# Patient Record
Sex: Female | Born: 1950 | Race: Black or African American | Hispanic: No | Marital: Single | Smoking: Current some day smoker
Health system: Southern US, Community
[De-identification: ages and names within clinical notes are randomized; demographics above are authoritative.]

## PROBLEM LIST (undated history)

## (undated) ENCOUNTER — Emergency Department (HOSPITAL_COMMUNITY): Payer: Medicaid Other

## (undated) DIAGNOSIS — F99 Mental disorder, not otherwise specified: Secondary | ICD-10-CM

## (undated) DIAGNOSIS — F32A Depression, unspecified: Secondary | ICD-10-CM

## (undated) DIAGNOSIS — F319 Bipolar disorder, unspecified: Secondary | ICD-10-CM

## (undated) DIAGNOSIS — F1010 Alcohol abuse, uncomplicated: Secondary | ICD-10-CM

## (undated) DIAGNOSIS — R0602 Shortness of breath: Secondary | ICD-10-CM

## (undated) DIAGNOSIS — C801 Malignant (primary) neoplasm, unspecified: Secondary | ICD-10-CM

## (undated) DIAGNOSIS — F329 Major depressive disorder, single episode, unspecified: Secondary | ICD-10-CM

## (undated) DIAGNOSIS — Z923 Personal history of irradiation: Secondary | ICD-10-CM

## (undated) HISTORY — DX: Bipolar disorder, unspecified: F31.9

---

## 2002-01-02 ENCOUNTER — Inpatient Hospital Stay (HOSPITAL_COMMUNITY): Admission: EM | Admit: 2002-01-02 | Discharge: 2002-01-13 | Payer: Self-pay | Admitting: *Deleted

## 2002-01-11 ENCOUNTER — Encounter: Payer: Self-pay | Admitting: Psychiatry

## 2002-05-05 ENCOUNTER — Inpatient Hospital Stay (HOSPITAL_COMMUNITY): Admission: EM | Admit: 2002-05-05 | Discharge: 2002-05-11 | Payer: Self-pay | Admitting: Psychiatry

## 2002-11-04 ENCOUNTER — Emergency Department (HOSPITAL_COMMUNITY): Admission: EM | Admit: 2002-11-04 | Discharge: 2002-11-04 | Payer: Self-pay | Admitting: Emergency Medicine

## 2002-11-11 ENCOUNTER — Emergency Department (HOSPITAL_COMMUNITY): Admission: EM | Admit: 2002-11-11 | Discharge: 2002-11-11 | Payer: Self-pay | Admitting: *Deleted

## 2003-01-02 ENCOUNTER — Emergency Department (HOSPITAL_COMMUNITY): Admission: EM | Admit: 2003-01-02 | Discharge: 2003-01-02 | Payer: Self-pay | Admitting: Emergency Medicine

## 2003-02-18 ENCOUNTER — Emergency Department (HOSPITAL_COMMUNITY): Admission: EM | Admit: 2003-02-18 | Discharge: 2003-02-18 | Payer: Self-pay | Admitting: Emergency Medicine

## 2005-08-12 ENCOUNTER — Emergency Department (HOSPITAL_COMMUNITY): Admission: EM | Admit: 2005-08-12 | Discharge: 2005-08-12 | Payer: Self-pay | Admitting: Emergency Medicine

## 2005-10-25 ENCOUNTER — Emergency Department (HOSPITAL_COMMUNITY): Admission: EM | Admit: 2005-10-25 | Discharge: 2005-10-26 | Payer: Self-pay | Admitting: Emergency Medicine

## 2005-12-06 ENCOUNTER — Emergency Department (HOSPITAL_COMMUNITY): Admission: EM | Admit: 2005-12-06 | Discharge: 2005-12-06 | Payer: Self-pay | Admitting: Emergency Medicine

## 2006-05-26 ENCOUNTER — Emergency Department (HOSPITAL_COMMUNITY): Admission: EM | Admit: 2006-05-26 | Discharge: 2006-05-27 | Payer: Self-pay | Admitting: Emergency Medicine

## 2010-01-07 ENCOUNTER — Emergency Department (HOSPITAL_COMMUNITY): Admission: EM | Admit: 2010-01-07 | Discharge: 2010-01-08 | Payer: Self-pay | Admitting: Emergency Medicine

## 2011-02-20 LAB — CBC
HCT: 37.7 % (ref 36.0–46.0)
Hemoglobin: 13 g/dL (ref 12.0–15.0)
Platelets: 208 10*3/uL (ref 150–400)
WBC: 8.5 10*3/uL (ref 4.0–10.5)

## 2011-02-20 LAB — URINALYSIS, ROUTINE W REFLEX MICROSCOPIC
Bilirubin Urine: NEGATIVE
Nitrite: NEGATIVE
Specific Gravity, Urine: 1.002 — ABNORMAL LOW (ref 1.005–1.030)
Urobilinogen, UA: 0.2 mg/dL (ref 0.0–1.0)
pH: 5.5 (ref 5.0–8.0)

## 2011-02-20 LAB — RAPID URINE DRUG SCREEN, HOSP PERFORMED
Amphetamines: NOT DETECTED
Barbiturates: NOT DETECTED
Benzodiazepines: NOT DETECTED
Cocaine: NOT DETECTED
Opiates: NOT DETECTED
Tetrahydrocannabinol: NOT DETECTED

## 2011-02-20 LAB — BASIC METABOLIC PANEL
BUN: 10 mg/dL (ref 6–23)
CO2: 25 mEq/L (ref 19–32)
Calcium: 8.3 mg/dL — ABNORMAL LOW (ref 8.4–10.5)
Chloride: 102 mEq/L (ref 96–112)
Creatinine, Ser: 0.67 mg/dL (ref 0.4–1.2)
GFR calc Af Amer: >60 mL/min (ref >60)
GFR calc non Af Amer: >60 mL/min (ref >60)
Glucose, Bld: 120 mg/dL — ABNORMAL HIGH (ref 70–99)
Potassium: 3.4 mEq/L — ABNORMAL LOW (ref 3.5–5.1)
Sodium: 134 mEq/L — ABNORMAL LOW (ref 135–145)

## 2011-02-20 LAB — DIFFERENTIAL
Eosinophils Relative: 0 % (ref 0–5)
Lymphocytes Relative: 19 % (ref 12–46)
Lymphs Abs: 1.6 10*3/uL (ref 0.7–4.0)
Monocytes Relative: 6 % (ref 3–12)
Neutro Abs: 6.4 10*3/uL (ref 1.7–7.7)

## 2011-02-20 LAB — TRICYCLICS SCREEN, URINE: TCA Scrn: NOT DETECTED

## 2011-02-20 LAB — ETHANOL
Alcohol, Ethyl (B): 229 mg/dL — ABNORMAL HIGH (ref 0–10)
Alcohol, Ethyl (B): 352 mg/dL — ABNORMAL HIGH (ref 0–10)
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
## 2011-04-19 NOTE — Discharge Summary (Signed)
[facility]  Patient:    Lorraine Tapia, Lorraine Tapia Visit Number: [id number] MRN: [id number]          Service Type: PSY Location: [phone] Attending Physician:  Jeanice Lim Dictated by:   Jeanice Lim, M.D. Admit Date:  05/05/2002 Discharge Date: 05/11/2002                             Discharge Summary  IDENTIFYING DATA:  This is a 60 year old single African-American female involuntarily committed for psychosis.  The patient presented on commitment papers reporting the patient appeared confused, waiting for deceased family members to greet her; stated sister was an Hospital doctor.  ADMISSION MEDICATIONS:  Haldol injection every month.  ALLERGIES:  None.  PHYSICAL EXAMINATION:  GENERAL:  Essentially within normal limits except for obesity.  NEUROLOGIC:  Nonfocal.  ROUTINE ADMISSION LABORATORY DATA:  Within normal limits.  Albumin slightly low at 3.4.  MCV 106.3, elevated.  MENTAL STATUS EXAMINATION:  The patient was an alert, middle-aged, overweight African-American female, disheveled, casually dressed with good eye contact. Speech: Clear.  Mood: Euthymic.  Affect: Appropriate.  Thought process: Somewhat suspicious and paranoid.  Cognitive: Intact.  Judgment and insight: Limited.  ADMITTING DIAGNOSES: Axis I:    1. Paranoid schizophrenia.            2. Possible adjustment disorder. Axis II:   None. Axis III:  None. Axis IV:   Moderate problems with primary support group. Axis V:    30/60  HOSPITAL COURSE:  The patient was admitted and ordered routine p.r.n. medications and given Haldol Decanoate 100 mg IM now which was also ordered every four weeks.  The patient was set for followup.  She tolerated the injection well, had no overt psychotic symptoms nor dangerous ideation or inappropriate behavior during hospitalization.  CONDITION AT DISCHARGE:  Improved.  She was more calm.  Mood: Euthymic. Affect: Brighter.  Thought process: Goal  directed.  Thought content: Negative for dangerous ideation or psychotic symptoms.  DISCHARGE MEDICATIONS: 1. Zyprexa 10 mg q.h.s. 2. Haldol Decanoate 100 mg IM every four weeks; next due on July 3.  FOLLOWUPArn Medal on June 12 at 9:30 a.m.  DISCHARGE DIAGNOSES: Axis I:    1. Paranoid schizophrenia.            2. Possible adjustment disorder. Axis II:   None. Axis III:  None. Axis IV:   Moderate problems with primary support group. Axis V:    Global assessment of functioning on discharge was 50. Dictated by:   Jeanice Lim, M.D. Attending Physician:  Jeanice Lim DD:  06/09/02 TD:  06/11/02 Job: [id number]

## 2011-10-07 ENCOUNTER — Emergency Department (HOSPITAL_COMMUNITY)
Admission: EM | Admit: 2011-10-07 | Discharge: 2011-10-08 | Disposition: A | Discharge status: Home / Self Care | Payer: Medicaid Other | Attending: Emergency Medicine | Admitting: Emergency Medicine

## 2011-10-07 ENCOUNTER — Encounter (HOSPITAL_COMMUNITY): Payer: Self-pay | Admitting: Emergency Medicine

## 2011-10-07 DIAGNOSIS — Z139 Encounter for screening, unspecified: Secondary | ICD-10-CM | POA: Insufficient documentation

## 2011-10-07 DIAGNOSIS — F489 Nonpsychotic mental disorder, unspecified: Secondary | ICD-10-CM | POA: Insufficient documentation

## 2011-10-07 DIAGNOSIS — F1010 Alcohol abuse, uncomplicated: Secondary | ICD-10-CM | POA: Insufficient documentation

## 2011-10-07 HISTORY — DX: Mental disorder, not otherwise specified: F99

## 2011-10-07 LAB — URINALYSIS, ROUTINE W REFLEX MICROSCOPIC
Bilirubin Urine: NEGATIVE
Glucose, UA: NEGATIVE mg/dL
Protein, ur: NEGATIVE mg/dL
Urobilinogen, UA: 0.2 mg/dL (ref 0.0–1.0)

## 2011-10-07 LAB — COMPREHENSIVE METABOLIC PANEL
ALT: 12 U/L (ref 0–35)
AST: 18 U/L (ref 0–37)
Albumin: 3.7 g/dL (ref 3.5–5.2)
Alkaline Phosphatase: 79 U/L (ref 39–117)
CO2: 24 mEq/L (ref 19–32)
Chloride: 101 mEq/L (ref 96–112)
GFR calc non Af Amer: >90 mL/min (ref >90)
Potassium: 3.6 mEq/L (ref 3.5–5.1)
Sodium: 137 mEq/L (ref 135–145)
Total Bilirubin: 0.2 mg/dL — ABNORMAL LOW (ref 0.3–1.2)

## 2011-10-07 LAB — URINE MICROSCOPIC-ADD ON

## 2011-10-07 LAB — CBC
MCV: 99.7 fL (ref 78.0–100.0)
Platelets: 218 10*3/uL (ref 150–400)
RBC: 3.75 MIL/uL — ABNORMAL LOW (ref 3.87–5.11)
RDW: 13.7 % (ref 11.5–15.5)
WBC: 9.1 10*3/uL (ref 4.0–10.5)

## 2011-10-07 LAB — RAPID URINE DRUG SCREEN, HOSP PERFORMED
Amphetamines: NOT DETECTED
Barbiturates: NOT DETECTED
Opiates: NOT DETECTED
Tetrahydrocannabinol: NOT DETECTED

## 2011-10-07 NOTE — ED Notes (Signed)
Pt has a swollen jaw she stated that it has been like that for a year

## 2011-10-07 NOTE — ED Provider Notes (Signed)
History     CSN: [id number] Arrival date & time: 10/07/2011  6:15 PM   First MD Initiated Contact with Patient 10/07/11 1921      Chief Complaint  Patient presents with  . Fall    (Consider location/radiation/quality/duration/timing/severity/associated sxs/prior treatment) HPI Comments: Patient presents this afternoon to the emergency department and does not know why she is here.  She does admit to drinking alcohol today but denies that she drinks daily.  She denies any pain at this point in time.  I did review the nursing notes which report that she had a fall down 3 steps but patient denies any discomfort currently.  She denies any chest pain, shortness of breath, abdominal pain, nausea, diarrhea.  She does not claim that she is suicidal or homicidal at this point in time.  Onset of her symptoms appears to be today.  There is no specific inciting or relieving factors that I can determine at this time.  Patient is a 60 y.o. female presenting with fall. The history is provided by the patient. No language interpreter was used.  Fall Pertinent negatives include no fever, no abdominal pain, no nausea, no vomiting and no headaches.    Past Medical History  Diagnosis Date  . Psychiatric disorder     History reviewed. No pertinent past surgical history.  No family history on file.  History  Substance Use Topics  . Smoking status: Not on file  . Smokeless tobacco: Not on file  . Alcohol Use: Yes    OB History    Grav Para Term Preterm Abortions TAB SAB Ect Mult Living                  Review of Systems  Constitutional: Negative.  Negative for fever and chills.  HENT: Negative.   Eyes: Negative.  Negative for discharge and redness.  Respiratory: Negative.  Negative for cough and shortness of breath.   Cardiovascular: Negative.  Negative for chest pain.  Gastrointestinal: Negative.  Negative for nausea, vomiting, abdominal pain and diarrhea.  Genitourinary: Negative.   Negative for dysuria and vaginal discharge.  Musculoskeletal: Negative.  Negative for back pain.  Skin: Negative.  Negative for color change and rash.  Neurological: Negative.  Negative for syncope and headaches.  Hematological: Negative.  Negative for adenopathy.  Psychiatric/Behavioral: Negative for confusion.  All other systems reviewed and are negative.    Allergies  Review of patient's allergies indicates no known allergies.  Home Medications   Current Outpatient Rx  Name Route Sig Dispense Refill  . HALOPERIDOL LACTATE 5 MG/ML IJ SOLN  every 6 (six) hours as needed.        BP 181/92  Pulse 86  Temp(Src) 98 F (36.7 C) (Oral)  Resp 20  SpO2 98%  Physical Exam  Constitutional: She is oriented to person, place, and time. She appears well-developed and well-nourished.  Non-toxic appearance. She does not have a sickly appearance.       Words are slightly slurred on evaluation but she is otherwise appropriate.  HENT:  Head: Normocephalic and atraumatic.  Eyes: Conjunctivae, EOM and lids are normal. Pupils are equal, round, and reactive to light. No scleral icterus.  Neck: Trachea normal and normal range of motion. Neck supple.  Cardiovascular: Normal rate, regular rhythm and normal heart sounds.   Pulmonary/Chest: Effort normal and breath sounds normal.  Abdominal: Soft. Normal appearance. There is no tenderness. There is no rebound, no guarding and no CVA tenderness.  Musculoskeletal: Normal range  of motion.  Neurological: She is alert and oriented to person, place, and time. She has normal strength.  Skin: Skin is warm, dry and intact. No rash noted.  Psychiatric: She has a normal mood and affect. Her behavior is normal. Judgment and thought content normal.    ED Course  Procedures (including critical care time)  Results for orders placed during the hospital encounter of 10/07/11  CBC      Component Value Range   WBC 9.1  4.0 - 10.5 (K/uL)   RBC 3.75 (*) 3.87 -  5.11 (MIL/uL)   Hemoglobin 13.1  12.0 - 15.0 (g/dL)   HCT 16.1  09.6 - 04.5 (%)   MCV 99.7  78.0 - 100.0 (fL)   MCH 34.9 (*) 26.0 - 34.0 (pg)   MCHC 35.0  30.0 - 36.0 (g/dL)   RDW 40.9  81.1 - 91.4 (%)   Platelets 218  150 - 400 (K/uL)  COMPREHENSIVE METABOLIC PANEL      Component Value Range   Sodium 137  135 - 145 (mEq/L)   Potassium 3.6  3.5 - 5.1 (mEq/L)   Chloride 101  96 - 112 (mEq/L)   CO2 24  19 - 32 (mEq/L)   Glucose, Bld 109 (*) 70 - 99 (mg/dL)   BUN 10  6 - 23 (mg/dL)   Creatinine, Ser 7.82  0.50 - 1.10 (mg/dL)   Calcium 9.0  8.4 - 95.6 (mg/dL)   Total Protein 7.6  6.0 - 8.3 (g/dL)   Albumin 3.7  3.5 - 5.2 (g/dL)   AST 18  0 - 37 (U/L)   ALT 12  0 - 35 (U/L)   Alkaline Phosphatase 79  39 - 117 (U/L)   Total Bilirubin 0.2 (*) 0.3 - 1.2 (mg/dL)   GFR calc non Af Amer >90  >90 (mL/min)   GFR calc Af Amer >90  >90 (mL/min)  URINALYSIS, ROUTINE W REFLEX MICROSCOPIC      Component Value Range   Color, Urine YELLOW  YELLOW    Appearance CLEAR  CLEAR    Specific Gravity, Urine 1.008  1.005 - 1.030    pH 5.5  5.0 - 8.0    Glucose, UA NEGATIVE  NEGATIVE (mg/dL)   Hgb urine dipstick SMALL (*) NEGATIVE    Bilirubin Urine NEGATIVE  NEGATIVE    Ketones, ur NEGATIVE  NEGATIVE (mg/dL)   Protein, ur NEGATIVE  NEGATIVE (mg/dL)   Urobilinogen, UA 0.2  0.0 - 1.0 (mg/dL)   Nitrite NEGATIVE  NEGATIVE    Leukocytes, UA NEGATIVE  NEGATIVE   URINE RAPID DRUG SCREEN (HOSP PERFORMED)      Component Value Range   Opiates NONE DETECTED  NONE DETECTED    Cocaine NONE DETECTED  NONE DETECTED    Benzodiazepines NONE DETECTED  NONE DETECTED    Amphetamines NONE DETECTED  NONE DETECTED    Tetrahydrocannabinol NONE DETECTED  NONE DETECTED    Barbiturates NONE DETECTED  NONE DETECTED   ETHANOL      Component Value Range   Alcohol, Ethyl (B) 241 (*) 0 - 11 (mg/dL)  URINE MICROSCOPIC-ADD ON      Component Value Range   Squamous Epithelial / LPF RARE  RARE    RBC / HPF 0-2  <3 (RBC/hpf)      MDM  Patient is here with no acute complaints.  She appears in clinically intoxicated and in alcohol supports this.  She has no suicidal or homicidal thoughts and has no other complaints no  further evaluation has been completed.  As she has no family with her at this time she would need to stay here for a number of hours to sober up and be reevaluated at that time for likely discharge.  Patient will be turned over to Dr. Freida Busman.       Nat Christen, MD 10/08/11 [phone]

## 2011-10-07 NOTE — ED Notes (Signed)
Pt is intoxicated lethagic has been drinking all day unable to answer some the questions

## 2011-10-07 NOTE — ED Notes (Signed)
Vital signs stable. 

## 2011-10-07 NOTE — ED Notes (Signed)
Pt presented to ED with EMS after fall down 3 stair has been drinking denies pain at this time

## 2011-10-08 ENCOUNTER — Encounter (HOSPITAL_COMMUNITY): Payer: Self-pay | Admitting: Emergency Medicine

## 2011-10-08 NOTE — ED Provider Notes (Signed)
Patient signed out to me by Dr. house work. Patient is now clinically sober will discharge to home  Toy Baker, MD 10/08/11 [phone]

## 2011-10-08 NOTE — ED Notes (Signed)
Entered by RN after pt had already been triaged

## 2011-10-08 NOTE — ED Notes (Signed)
Pt ambulating around department with steady gait. This Rn has made several phone calls for a ride but is unable to reach anyone. Pt sitting in bedside chair. Pt given coffee per her request. Pt denies any needs or concerns at this time. MD aware. Will cont to monitor

## 2011-10-08 NOTE — ED Notes (Signed)
Patient is resting comfortably. 

## 2011-10-08 NOTE — ED Notes (Addendum)
No complaints

## 2011-10-08 NOTE — ED Notes (Addendum)
Call bell within reach.

## 2013-06-22 ENCOUNTER — Emergency Department (HOSPITAL_COMMUNITY)
Admission: EM | Admit: 2013-06-22 | Discharge: 2013-06-22 | Disposition: A | Discharge status: Home / Self Care | Payer: Medicaid Other | Attending: Emergency Medicine | Admitting: Emergency Medicine

## 2013-06-22 ENCOUNTER — Encounter (HOSPITAL_COMMUNITY): Payer: Self-pay | Admitting: *Deleted

## 2013-06-22 DIAGNOSIS — K089 Disorder of teeth and supporting structures, unspecified: Secondary | ICD-10-CM | POA: Insufficient documentation

## 2013-06-22 DIAGNOSIS — K1379 Other lesions of oral mucosa: Secondary | ICD-10-CM

## 2013-06-22 DIAGNOSIS — M542 Cervicalgia: Secondary | ICD-10-CM | POA: Insufficient documentation

## 2013-06-22 DIAGNOSIS — R220 Localized swelling, mass and lump, head: Secondary | ICD-10-CM | POA: Insufficient documentation

## 2013-06-22 DIAGNOSIS — F489 Nonpsychotic mental disorder, unspecified: Secondary | ICD-10-CM | POA: Insufficient documentation

## 2013-06-22 DIAGNOSIS — R221 Localized swelling, mass and lump, neck: Secondary | ICD-10-CM | POA: Insufficient documentation

## 2013-06-22 DIAGNOSIS — F17200 Nicotine dependence, unspecified, uncomplicated: Secondary | ICD-10-CM | POA: Insufficient documentation

## 2013-06-22 DIAGNOSIS — K1370 Unspecified lesions of oral mucosa: Secondary | ICD-10-CM | POA: Insufficient documentation

## 2013-06-22 MED ORDER — TRAMADOL HCL 50 MG PO TABS: 50.0000 mg | ORAL_TABLET | Freq: Four times a day (QID) | ORAL | Status: DC | PRN

## 2013-06-22 NOTE — ED Provider Notes (Signed)
History  This chart was scribed for Glade Nurse, PA-C working with Enid Skeens, MD by Greggory Stallion, ED scribe. This patient was seen in room TR07C/TR07C and the patient's care was started at 3:02 PM.  CSN: [id number] Arrival date & time 06/22/13  1448   Chief Complaint  Patient presents with  . Large Gum Growth    The history is provided by the patient. No language interpreter was used.    HPI Comments: Lorraine Tapia is a 62 y.o. female who presents to the Emergency Department complaining of a growth to the upper right side of her mouth that first started 2-3 years ago with intermittent dull pain. Pt rates the pain 7/10. Radiates down the right lateral side of her neck to a second large mass also growing slowly over time. Pt states it is worse at night She states the growth has gotten larger over time. Pt states she has not seen any one for it and she does not have a PCP. She states there has been no bleeding from it. Pt states she has taken Tylenol for the pain with some relief. Pt states she smokes 1 pack of cigarettes per day for the last 50 years.   Past Medical History  Diagnosis Date  . Psychiatric disorder    History reviewed. No pertinent past surgical history. No family history on file. History  Substance Use Topics  . Smoking status: Current Every Day Smoker  . Smokeless tobacco: Not on file  . Alcohol Use: Yes   OB History   Grav Para Term Preterm Abortions TAB SAB Ect Mult Living                 Review of Systems  Constitutional: Negative for fever, chills, activity change, appetite change and unexpected weight change.  HENT: Positive for facial swelling, neck pain and dental problem. Negative for sore throat, drooling, trouble swallowing, neck stiffness and voice change.        Right sided swelling to right cheek and right lateral neck  Eyes: Negative for photophobia and visual disturbance.  Respiratory: Negative for cough and shortness of breath.    Cardiovascular: Negative for chest pain.  Gastrointestinal: Negative for nausea and vomiting.  Genitourinary: Negative for dysuria.  Musculoskeletal: Negative for back pain.  Skin: Negative for rash.  Neurological: Negative for weakness, light-headedness, numbness and headaches.  Psychiatric/Behavioral: The patient is not nervous/anxious.     Allergies  Review of patient's allergies indicates no known allergies.  Home Medications   Current Outpatient Rx  Name  Route  Sig  Dispense  Refill  . haloperidol lactate (HALDOL) 5 MG/ML injection      *patient claims she gets a haloperidol injection once a month and that she gets 200mg  dose injection. Can't remember where she gets it or what pharmacy she goes to*          BP 176/81  Pulse 64  Temp(Src) 98.2 F (36.8 C) (Oral)  Resp 18  SpO2 98%  Physical Exam  Nursing note and vitals reviewed. Constitutional: She is oriented to person, place, and time. She appears well-developed and well-nourished.  HENT:  Head: Normocephalic and atraumatic. No trismus in the jaw.  Right Ear: External ear normal.  Left Ear: External ear normal.  Nose: No mucosal edema. Right sinus exhibits no maxillary sinus tenderness and no frontal sinus tenderness. Left sinus exhibits no maxillary sinus tenderness and no frontal sinus tenderness.  Mouth/Throat: No dental abscesses. No tonsillar abscesses.  6 cm firm immoveable right buccal mass tender to touch, protruding into oropharynx but airway is patent. Teeth appear carious. Poor dentition. Multiple other teeth have grounds or are previously extracted. Neck is tender on right lateral near 4 cm, firm immoveable mass. Neck is supple with no decreased ROM. No trismus. No facial numbness. No paralysis of facial musculature.   Neck: Normal range of motion and full passive range of motion without pain. Neck supple. No muscular tenderness present. No rigidity. Normal range of motion present. No thyromegaly  present.    4 cm neck mass  Cardiovascular: Normal rate.   Pulmonary/Chest: Effort normal. No respiratory distress. She has no wheezes.  Abdominal: Soft. There is no tenderness.  Musculoskeletal: Normal range of motion.  Neurological: She is alert and oriented to person, place, and time. No cranial nerve deficit.  Skin: Skin is warm and dry.  Psychiatric: She has a normal mood and affect.    ED Course  Procedures (including critical care time)  DIAGNOSTIC STUDIES: Oxygen Saturation is 98% on RA, normal by my interpretation.    COORDINATION OF CARE: 3:21 PM-Discussed treatment plan which includes pain medication with pt at bedside and pt agreed to plan. Advised pt to follow up with an oral surgeon.   Labs Reviewed - No data to display No results found. 1. Mass of buccal mucosa     MDM   Large, firm immoveable mass in the right buccal mucosa approx 6 cm in diameter causing obvious facial swelling. Second firm, immovable mass on the right lateral neck approx 4 cm in diameter. Pt has a 50-pack year hx of smoking and drinks alcohol several days a week.  Pt requires further evaluation by an oral surgeon to r/o cancer. Stressed the importance of immediate follow up with the patient who states she understands and is in agreement with plan. Provided some pain meds.   Glade Nurse, [place] 06/22/13 1823

## 2013-06-22 NOTE — ED Notes (Signed)
Pt is here with right facial gum growth for the past 2-3 years and today it is bothering her and wants it removed

## 2013-06-24 NOTE — ED Provider Notes (Addendum)
Medical screening examination/treatment/procedure(s) were performed by non-physician practitioner and as supervising physician I was immediately available for consultation/collaboration.  Enid Skeens, MD 06/24/13 0111  Enid Skeens, MD 07/19/13 [phone]

## 2013-06-28 ENCOUNTER — Ambulatory Visit: Payer: Medicaid Other

## 2013-07-05 ENCOUNTER — Encounter: Payer: Self-pay | Admitting: Internal Medicine

## 2013-07-05 ENCOUNTER — Ambulatory Visit: Payer: Medicaid Other | Attending: Family Medicine | Admitting: Internal Medicine

## 2013-07-05 VITALS — BP 152/84 | HR 71 | Temp 98.0°F | Ht 71.0 in | Wt 218.2 lb

## 2013-07-05 DIAGNOSIS — K1370 Unspecified lesions of oral mucosa: Secondary | ICD-10-CM

## 2013-07-05 DIAGNOSIS — K1379 Other lesions of oral mucosa: Secondary | ICD-10-CM | POA: Insufficient documentation

## 2013-07-05 DIAGNOSIS — R221 Localized swelling, mass and lump, neck: Secondary | ICD-10-CM

## 2013-07-05 DIAGNOSIS — K118 Other diseases of salivary glands: Secondary | ICD-10-CM | POA: Insufficient documentation

## 2013-07-05 NOTE — Progress Notes (Signed)
Patient ID: Lorraine Tapia, female   DOB: February 17, 1951, 62 y.o.   MRN: [id number]  CC: Swelling on R face x 3 years  HPI: Lorraine Tapia is a 62 y.o. Woman with no significant past medical history except for an unspecified psychiatry disorder for which she takes monthly injection of Haldol, came today complaining of a growth to the upper right side of her mouth that first started 2-3 years ago with intermittent dull pain. Pt rates the pain 5/10, radiates down the right lateral side of her neck to a second large swelling also growing slowly over time. She states the growth has gotten larger over time, but no spontaneous bleeding, no pus discharge at any time. She said she is here for the fear that this may grow to become cancer one day, so she wants it removed. Pt states she has taken Tylenol for the pain with some relief. She has smoked 1 PPD since she was 62 years old (about 50 years). She said she does not want to quit because it calms her down. She has no HTN, or DM. No chest pain, no headache. She claimed to have lost about 4 pounds in the last few months.   No Known Allergies Past Medical History  Diagnosis Date  . Psychiatric disorder    Current Outpatient Prescriptions on File Prior to Visit  Medication Sig Dispense Refill  . haloperidol lactate (HALDOL) 5 MG/ML injection *patient claims she gets a haloperidol injection once a month and that she gets 200mg  dose injection. Can't remember where she gets it or what pharmacy she goes to*      . traMADol (ULTRAM) 50 MG tablet Take 1 tablet (50 mg total) by mouth every 6 (six) hours as needed for pain.  15 tablet  0   No current facility-administered medications on file prior to visit.   No family history on file. History   Social History  . Marital Status: Married    Spouse Name: N/A    Number of Children: N/A  . Years of Education: N/A   Occupational History  . Not on file.   Social History Main Topics  . Smoking status: Current Every Day  Smoker  . Smokeless tobacco: Not on file  . Alcohol Use: Yes  . Drug Use: No  . Sexually Active: Yes   Other Topics Concern  . Not on file   Social History Narrative  . No narrative on file    Review of Systems: Constitutional: Negative for fever, chills, diaphoresis, activity change, appetite change and fatigue. HENT: Negative for ear pain, nosebleeds, congestion, rhinorrhea, neck stiffness and ear discharge.  Eyes: Negative for pain, discharge, redness, itching and visual disturbance. Respiratory: Negative for cough, choking, chest tightness, shortness of breath, wheezing and stridor.  Cardiovascular: Negative for chest pain, palpitations and leg swelling. Gastrointestinal: Negative for abdominal distention. Genitourinary: Negative for dysuria, urgency, frequency, hematuria, flank pain, decreased urine volume, difficulty urinating and dyspareunia.  Musculoskeletal: Negative for back pain, joint swelling, arthralgias and gait problem. Neurological: Negative for dizziness, tremors, seizures, syncope, facial asymmetry, speech difficulty, weakness, light-headedness, numbness and headaches.  Hematological: Negative for adenopathy. Does not bruise/bleed easily. Psychiatric/Behavioral: On Haldol for psychiatry condition   Objective:   Filed Vitals:   07/05/13 0947  BP: 152/84  Pulse: 71  Temp: 98 F (36.7 C)    Physical Exam: Constitutional: Patient appears well-developed and well-nourished. No distress. HENT: There is an obvious facial asymmetry with a mass on the right maxillary area  measuring about 8 x 10 cm firm, irregular, immobile, attached to the skin, extending to the oral cavity, highly vascularized, non-tender, no obvious bleed, no discharge. There is another mass noted in the right submandibular area that measures about 6 x 6 cm attached to the skin, non-tender, firm, nonfluctuant Eyes: Conjunctivae and EOM are normal. PERRLA, no scleral icterus. Neck: Normal ROM. Neck  supple. No JVD. No tracheal deviation. No thyromegaly. CVS: RRR, S1/S2 +, no murmurs, no gallops, no carotid bruit.  Pulmonary: Effort and breath sounds normal, no stridor, rhonchi, wheezes, rales.  Abdominal: Soft. BS +,  no distension, tenderness, rebound or guarding.  Musculoskeletal: Normal range of motion. No edema and no tenderness.  Lymphadenopathy: No lymphadenopathy noted, cervical, inguinal or axillary Neuro: Alert. Normal reflexes, muscle tone coordination. No cranial nerve deficit. Skin: Skin is warm and dry. No rash noted. Not diaphoretic. No erythema. No pallor. Psychiatric: Normal mood and affect. Behavior, judgment, thought content normal.  Lab Results  Component Value Date   WBC 9.1 10/07/2011   HGB 13.1 10/07/2011   HCT 37.4 10/07/2011   MCV 99.7 10/07/2011   PLT 218 10/07/2011   Lab Results  Component Value Date   CREATININE 0.52 10/07/2011   BUN 10 10/07/2011   NA 137 10/07/2011   K 3.6 10/07/2011   CL 101 10/07/2011   CO2 24 10/07/2011    No results found for this basename: HGBA1C   Lipid Panel  No results found for this basename: chol, trig, hdl, cholhdl, vldl, ldlcalc       Assessment and plan:   Patient Active Problem List   Diagnosis Date Noted  . Mass of buccal mucosa 07/05/2013  . Mass of submandibular gland 07/05/2013  . 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 Patient is hereby referred an appointment made with ENT surgeon She has been extensively counseled on smoking cessation She was also counseled on alcohol abuse She'll be seen back in the clinic after surgery  Jeanann Lewandowsky, MD [facility] [place] [employer], [place] [phone]   07/05/2013, 11:10 AM

## 2013-07-08 ENCOUNTER — Other Ambulatory Visit (HOSPITAL_COMMUNITY)
Admission: RE | Admit: 2013-07-08 | Discharge: 2013-07-08 | Disposition: A | Discharge status: Home / Self Care | Payer: Medicaid Other | Source: Ambulatory Visit | Attending: Otolaryngology | Admitting: Otolaryngology

## 2013-07-08 ENCOUNTER — Other Ambulatory Visit: Payer: Self-pay | Admitting: Otolaryngology

## 2013-07-08 DIAGNOSIS — R221 Localized swelling, mass and lump, neck: Secondary | ICD-10-CM | POA: Insufficient documentation

## 2013-07-08 DIAGNOSIS — R220 Localized swelling, mass and lump, head: Secondary | ICD-10-CM | POA: Insufficient documentation

## 2013-07-13 ENCOUNTER — Other Ambulatory Visit: Payer: Self-pay | Admitting: Otolaryngology

## 2013-07-13 DIAGNOSIS — K118 Other diseases of salivary glands: Secondary | ICD-10-CM

## 2013-07-13 DIAGNOSIS — R220 Localized swelling, mass and lump, head: Secondary | ICD-10-CM

## 2013-07-16 ENCOUNTER — Ambulatory Visit
Admission: RE | Admit: 2013-07-16 | Discharge: 2013-07-16 | Disposition: A | Discharge status: Home / Self Care | Payer: Medicaid Other | Source: Ambulatory Visit | Attending: Otolaryngology | Admitting: Otolaryngology

## 2013-07-16 DIAGNOSIS — R220 Localized swelling, mass and lump, head: Secondary | ICD-10-CM

## 2013-07-16 DIAGNOSIS — K118 Other diseases of salivary glands: Secondary | ICD-10-CM

## 2013-07-16 MED ORDER — IOHEXOL 300 MG/ML  SOLN
75.0000 mL | Freq: Once | INTRAMUSCULAR | Status: AC | PRN
  Administered 2013-07-16: 75 mL via INTRAVENOUS

## 2013-09-01 ENCOUNTER — Ambulatory Visit: Payer: Medicaid Other | Attending: Family Medicine | Admitting: Family Medicine

## 2013-09-01 ENCOUNTER — Encounter: Payer: Self-pay | Admitting: Family Medicine

## 2013-09-01 VITALS — BP 155/75 | HR 77 | Temp 98.8°F | Resp 18 | Ht 71.0 in | Wt 218.0 lb

## 2013-09-01 DIAGNOSIS — K118 Other diseases of salivary glands: Secondary | ICD-10-CM

## 2013-09-01 DIAGNOSIS — Z23 Encounter for immunization: Secondary | ICD-10-CM | POA: Insufficient documentation

## 2013-09-01 DIAGNOSIS — C801 Malignant (primary) neoplasm, unspecified: Secondary | ICD-10-CM

## 2013-09-01 DIAGNOSIS — K1379 Other lesions of oral mucosa: Secondary | ICD-10-CM

## 2013-09-01 DIAGNOSIS — C768 Malignant neoplasm of other specified ill-defined sites: Secondary | ICD-10-CM | POA: Insufficient documentation

## 2013-09-01 DIAGNOSIS — K1370 Unspecified lesions of oral mucosa: Secondary | ICD-10-CM

## 2013-09-01 LAB — COMPLETE METABOLIC PANEL WITH GFR
AST: 15 U/L (ref 0–37)
Alkaline Phosphatase: 67 U/L (ref 39–117)
BUN: 22 mg/dL (ref 6–23)
GFR, Est Non African American: >89 mL/min
Glucose, Bld: 79 mg/dL (ref 70–99)
Sodium: 137 mEq/L (ref 135–145)
Total Bilirubin: 0.4 mg/dL (ref 0.3–1.2)

## 2013-09-01 LAB — CBC
HCT: 39.5 % (ref 36.0–46.0)
Hemoglobin: 13.7 g/dL (ref 12.0–15.0)
MCH: 34.3 pg — ABNORMAL HIGH (ref 26.0–34.0)
MCHC: 34.7 g/dL (ref 30.0–36.0)
RDW: 13.5 % (ref 11.5–15.5)

## 2013-09-01 NOTE — Progress Notes (Signed)
Patient ID: Lorraine Tapia, female   DOB: [birth year], 62 y.o.   MRN: [id number]  CC: Follow up   HPI: Pt saw ENT and had a biopsy done of her buccal mass and submandibular mass.  It reveals low grade adenocarcinoma.  Pt was referred to [facility] in [facility] and missed the appointment.  She is presenting back here and wants to have surgery done in [place].  She has a transportation problem and was not able to get to [facility] to get her ENT evaluation done.  Pt reports that mass is getting larger.    No Known Allergies Past Medical History  Diagnosis Date  . Psychiatric disorder    Current Outpatient Prescriptions on File Prior to Visit  Medication Sig Dispense Refill  . haloperidol lactate (HALDOL) 5 MG/ML injection *patient claims she gets a haloperidol injection once a month and that she gets 200mg  dose injection. Can't remember where she gets it or what pharmacy she goes to*      . traMADol (ULTRAM) 50 MG tablet Take 1 tablet (50 mg total) by mouth every 6 (six) hours as needed for pain.  15 tablet  0   No current facility-administered medications on file prior to visit.   History reviewed. No pertinent family history. History   Social History  . Marital Status: Married    Spouse Name: N/A    Number of Children: N/A  . Years of Education: N/A   Occupational History  . Not on file.   Social History Main Topics  . Smoking status: Current Every Day Smoker  . Smokeless tobacco: Not on file  . Alcohol Use: Yes  . Drug Use: No  . Sexual Activity: Yes   Other Topics Concern  . Not on file   Social History Narrative  . No narrative on file    Review of Systems  Constitutional: Negative for fever, chills, diaphoresis, activity change, appetite change and fatigue.  HENT: Mass on right jaw. Negative for ear pain, nosebleeds, congestion, facial swelling, rhinorrhea, neck pain, neck stiffness and ear discharge.   Eyes: Negative for pain, discharge, redness,  itching and visual disturbance.  Respiratory: Negative for cough, choking, chest tightness, shortness of breath, wheezing and stridor.   Cardiovascular: Negative for chest pain, palpitations and leg swelling.  Gastrointestinal: Negative for abdominal distention.  Genitourinary: Negative for dysuria, urgency, frequency, hematuria, flank pain, decreased urine volume, difficulty urinating and dyspareunia.  Musculoskeletal: Negative for back pain, joint swelling, arthralgias and gait problem.  Neurological: Negative for dizziness, tremors, seizures, syncope, facial asymmetry, speech difficulty, weakness, light-headedness, numbness and headaches.  Hematological: Negative for adenopathy. Does not bruise/bleed easily.  Psychiatric/Behavioral: Negative for hallucinations, behavioral problems, confusion, dysphoric mood, decreased concentration and agitation.    Objective:   Filed Vitals:   09/01/13 1230  BP: 155/75  Pulse: 77  Temp: 98.8 F (37.1 C)  Resp: 18    Physical Exam  Constitutional: Appears well-developed and well-nourished. No distress.  HENT: Normocephalic. External right and left ear normal. Oropharynx is clear and moist.  Eyes: Conjunctivae and EOM are normal. PERRLA, no scleral icterus.  Neck: Normal ROM. Neck supple. 2 large masses on right parotid, jaw and submandibular node mass. No JVD. No tracheal deviation. No thyromegaly.  CVS: RRR, S1/S2 +, no murmurs, no gallops, no carotid bruit.  Pulmonary: Effort and breath sounds normal, no stridor, rhonchi, wheezes, rales.  Abdominal: Soft. BS +,  no distension, tenderness, rebound or guarding.  Musculoskeletal: Normal range of motion.  No edema and no tenderness.  Lymphadenopathy: No lymphadenopathy noted, cervical, inguinal. Neuro: Alert. Normal reflexes, muscle tone coordination. No cranial nerve deficit. Skin: Skin is warm and dry. No rash noted. Not diaphoretic. No erythema. No pallor.  Psychiatric: Normal mood and affect.  Behavior, judgment, thought content normal.   Lab Results  Component Value Date   WBC 9.1 10/07/2011   HGB 13.1 10/07/2011   HCT 37.4 10/07/2011   MCV 99.7 10/07/2011   PLT 218 10/07/2011   Lab Results  Component Value Date   CREATININE 0.52 10/07/2011   BUN 10 10/07/2011   NA 137 10/07/2011   K 3.6 10/07/2011   CL 101 10/07/2011   CO2 24 10/07/2011    No results found for this basename: HGBA1C   Lipid Panel  No results found for this basename: chol, trig, hdl, cholhdl, vldl, ldlcalc       Assessment and plan:   Patient Active Problem List   Diagnosis Date Noted  . Adenocarcinoma 09/01/2013  . Mass of buccal mucosa 07/05/2013  . Mass of submandibular gland 07/05/2013  . 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  Called and got patient and appointment with ENT at [facility].  Pt aware of appointment and reports that she will have transportation assistance from daughter.   Flu vaccine given.   Labs Checked  The patient was counseled on the dangers of tobacco use, and was advised to quit.  Reviewed strategies to maximize success, including removing cigarettes and smoking materials from environment, stress management and support of family/friends.  RTC in 2 weeks  The patient was given clear instructions to go to ER or return to medical center if symptoms don't improve, worsen or new problems develop.  The patient verbalized understanding.  The patient was told to call to get any lab results if not heard anything in the next week.    Rodney Langton, MD, CDE, FAAFP [facility], [place]     Results for orders placed in visit on 09/01/13  CBC      Result Value Range   WBC 8.6  4.0 - 10.5 K/uL   RBC 3.99  3.87 - 5.11 MIL/uL   Hemoglobin 13.7  12.0 - 15.0 g/dL   HCT 16.1  09.6 - 04.5 %   MCV 99.0  78.0 - 100.0 fL   MCH 34.3 (*) 26.0 - 34.0 pg   MCHC 34.7  30.0 - 36.0 g/dL   RDW 40.9   81.1 - 91.4 %   Platelets 226  150 - 400 K/uL  COMPLETE METABOLIC PANEL WITH GFR      Result Value Range   Sodium 137  135 - 145 mEq/L   Potassium 4.4  3.5 - 5.3 mEq/L   Chloride 106  96 - 112 mEq/L   CO2 23  19 - 32 mEq/L   Glucose, Bld 79  70 - 99 mg/dL   BUN 22  6 - 23 mg/dL   Creat 7.82  9.56 - 2.13 mg/dL   Total Bilirubin 0.4  0.3 - 1.2 mg/dL   Alkaline Phosphatase 67  39 - 117 U/L   AST 15  0 - 37 U/L   ALT 10  0 - 35 U/L   Total Protein 7.6  6.0 - 8.3 g/dL   Albumin 4.2  3.5 - 5.2 g/dL   Calcium 9.4  8.4 - 08.6 mg/dL   GFR, Est African  American >89     GFR, Est Non African American >89

## 2013-09-01 NOTE — Progress Notes (Signed)
Pt here needing referral for surgical removal cancerous mass right side of face Swelling seen x 4-5 yrs but has radiated to jaw. Denies pain

## 2013-09-01 NOTE — Patient Instructions (Addendum)

## 2013-09-02 ENCOUNTER — Encounter: Payer: Self-pay | Admitting: Family Medicine

## 2013-09-02 NOTE — Progress Notes (Signed)
Quick Note:  Please inform patient that labs came back OK.   C. L. Vonnetta Akey, MD, CDE, FAAFP [facility] [place], [place]   ______ 

## 2013-09-06 ENCOUNTER — Telehealth: Payer: Self-pay | Admitting: Emergency Medicine

## 2013-09-06 NOTE — Telephone Encounter (Signed)
Message copied by Darlis Loan on Mon Sep 06, 2013  4:04 PM ------      Message from: Cleora Fleet      Created: Thu Sep 02, 2013  8:18 AM       Please inform patient that labs came back OK.             Rodney Langton, MD, CDE, FAAFP      [facility]      [facility]      [place], [place]        ------

## 2013-09-06 NOTE — Telephone Encounter (Signed)
Left message for pt to call clinic for results 

## 2013-09-20 ENCOUNTER — Ambulatory Visit: Payer: Medicaid Other

## 2013-10-19 DIAGNOSIS — C801 Malignant (primary) neoplasm, unspecified: Secondary | ICD-10-CM

## 2013-10-19 HISTORY — DX: Malignant (primary) neoplasm, unspecified: C80.1

## 2013-10-19 HISTORY — PX: OTHER SURGICAL HISTORY: SHX169

## 2013-11-29 NOTE — Progress Notes (Signed)
Head and Neck Cancer Location of Tumor / Histology:right side mouth and neck  Patient presented  months ago with symptoms of:  Growth right upper mouth 2-3 years ago radiating down right lateral side of her neck, with intermittent dull pain, ,has gotten larger over time pain now 5/10   Biopsies of  07/08/13: DiagnosisBuccal mucosa, biopsy, right- POLYMORPHOUS LOW GRADE ADENOCARCINOMA, INVOLVING BILATERAL AND DEEP EDGES.Dr. Christia Reading Nutrition Status:  Weight changes:   Swallowing status:   Plans, if any, for PEG tube:   Tobacco/Marijuana/Snuff/ETOH use: 1 ppd since age 89  Past/Anticipated interventions by otolaryngology, if any: Dr.Dwight Earnestine Mealing Neck Ct and bx 07/08/13 on 07/29/2013,  recommended eval at [facility] about surgical management with complex reconstruction , 08/06/13 to arrange at [facility] for reconstruction and free flap  Scheduled with Dr. Ovid Curd 10/21/13  = composite resection of right buccal space tumor ,tight modified radical neck dissection including levels I,II and III.,submental myofasciocutaneous flap reconstruction of oral cavity buccal defect(7x10cm)  Attending, Dr.Christopher Lendell Caprice follow up appt 10/26/13    Past/Anticipated interventions by medical oncology, if any:   Referrals yet, to any of the following?  Social Work?   Dentistry?   Swallowing therapy?   Nutrition? Full liquid diet  With peridex mouth wash after every meal  Dr. Mateo Flow f/u appt   Med/Onc?  PEG placement?   SAFETY ISSUES:  Prior radiation? No  Pacemaker/ICD? No  Possible current pregnancy? No  Is the patient on methotrexate? No   Current Complaints / other details:  Married,smokes and drinks alcohol, mother cancer,Htn father

## 2013-11-30 ENCOUNTER — Telehealth: Payer: Self-pay | Admitting: *Deleted

## 2013-11-30 NOTE — Telephone Encounter (Signed)
Called pt at home to introduce myself as the nurse navigator that works with Dr. Mitzi Hansen and that I would be joining her during her appt with him tomorrow.  I reminded her of the 2:00 appt with a nurse prior to her seeing Dr. Mitzi Hansen, encouraged her to arrive at 1:45.  I explained that the [facility] is next door to [facility].  She verbalized understanding of appt and location.    Young Berry, RN, BSN, [facility] Navigator [phone]

## 2013-12-01 ENCOUNTER — Telehealth: Payer: Self-pay | Admitting: *Deleted

## 2013-12-01 ENCOUNTER — Ambulatory Visit
Admission: RE | Admit: 2013-12-01 | Discharge: 2013-12-01 | Disposition: A | Discharge status: Home / Self Care | Payer: No Typology Code available for payment source | Source: Ambulatory Visit | Attending: Radiation Oncology | Admitting: Radiation Oncology

## 2013-12-01 ENCOUNTER — Ambulatory Visit: Admission: RE | Admit: 2013-12-01 | Payer: No Typology Code available for payment source | Source: Ambulatory Visit

## 2013-12-01 NOTE — Telephone Encounter (Signed)
Called patient and spoke with her,asked if she was coming for her 2pm appt, called at 215pm"I don't have any transportation to come,I called my worker but he din't come"informed her we would need to reschedule and that [place], would be givving her a call 2:15 PM

## 2013-12-15 ENCOUNTER — Institutional Professional Consult (permissible substitution): Payer: No Typology Code available for payment source | Admitting: Radiation Oncology

## 2013-12-15 ENCOUNTER — Ambulatory Visit: Payer: No Typology Code available for payment source

## 2013-12-20 ENCOUNTER — Ambulatory Visit
Admission: RE | Admit: 2013-12-20 | Payer: No Typology Code available for payment source | Source: Ambulatory Visit | Admitting: Radiation Oncology

## 2013-12-20 ENCOUNTER — Ambulatory Visit: Admission: RE | Admit: 2013-12-20 | Payer: No Typology Code available for payment source | Source: Ambulatory Visit

## 2013-12-20 ENCOUNTER — Telehealth: Payer: Self-pay | Admitting: *Deleted

## 2013-12-20 NOTE — Telephone Encounter (Signed)
Called patient home phone =mail box full unable to leave message, called mobile phone, no answer let ring 30 times, no answer, informed Gayleen Orem, and Dr.Moody 1:39 PM

## 2013-12-22 ENCOUNTER — Telehealth: Payer: Self-pay | Admitting: *Deleted

## 2013-12-22 ENCOUNTER — Encounter: Payer: Self-pay | Admitting: *Deleted

## 2013-12-22 NOTE — Telephone Encounter (Addendum)
Placed follow-up call to patient re: missed appt yesterday with Dr. Lisbeth Renshaw.  This is the 3rd appt she has missed within the past 3 weeks.  Patient stated she couldn't keep appt b/c "I didn't have $3 for taxes (I clarified, she did not mean "taxi")".  To address apparent transportation needs, placed called to grandaughter Kalisa, LVM with request to call me.  Tried to reach her dtr but phone number "has been disconnected/no longer working".  I have made Polo Riley, LCSW, aware of situation.  Will continue to follow.  Gayleen Orem, RN, BSN, [facility] Head & Neck Oncology Navigator [phone]

## 2014-01-03 ENCOUNTER — Telehealth: Payer: Self-pay | Admitting: *Deleted

## 2014-01-03 NOTE — Telephone Encounter (Addendum)
Called patient re: missed appts.  She stated that she needs to know a week in advance so she can arrange transportation.  I arranged another appt for 01/12/14 @ 2:30, called her back with the information which I asked her to write down.  She did so along with my name and phone number.  I also spoke with Serafina Mitchell, patient's mental health counselor [phone]) at [facility] [place] [phone]) and made him aware of her appt.  He indicated that they he should be contacted when appts are made so their staff can facilitate; they have other patients that come to [facility].  I made him aware that she has missed 2 previous appts.  I provided him my phone number for his future use.    Gayleen Orem, RN, BSN, [facility] Head & Neck Oncology Navigator [phone]

## 2014-01-11 ENCOUNTER — Telehealth: Payer: Self-pay | Admitting: *Deleted

## 2014-01-12 ENCOUNTER — Ambulatory Visit
Admission: RE | Admit: 2014-01-12 | Discharge: 2014-01-12 | Disposition: A | Discharge status: Home / Self Care | Payer: Medicaid Other | Source: Ambulatory Visit | Attending: Radiation Oncology | Admitting: Radiation Oncology

## 2014-01-12 ENCOUNTER — Encounter: Payer: Self-pay | Admitting: *Deleted

## 2014-01-12 ENCOUNTER — Encounter: Payer: Self-pay | Admitting: Radiation Oncology

## 2014-01-12 VITALS — BP 152/91 | HR 76 | Temp 98.0°F | Wt 209.0 lb

## 2014-01-12 DIAGNOSIS — C770 Secondary and unspecified malignant neoplasm of lymph nodes of head, face and neck: Secondary | ICD-10-CM | POA: Insufficient documentation

## 2014-01-12 DIAGNOSIS — C801 Malignant (primary) neoplasm, unspecified: Secondary | ICD-10-CM

## 2014-01-12 DIAGNOSIS — C069 Malignant neoplasm of mouth, unspecified: Secondary | ICD-10-CM

## 2014-01-12 DIAGNOSIS — F17200 Nicotine dependence, unspecified, uncomplicated: Secondary | ICD-10-CM | POA: Insufficient documentation

## 2014-01-12 DIAGNOSIS — F329 Major depressive disorder, single episode, unspecified: Secondary | ICD-10-CM | POA: Insufficient documentation

## 2014-01-12 DIAGNOSIS — D490 Neoplasm of unspecified behavior of digestive system: Secondary | ICD-10-CM

## 2014-01-12 DIAGNOSIS — Z79899 Other long term (current) drug therapy: Secondary | ICD-10-CM | POA: Insufficient documentation

## 2014-01-12 DIAGNOSIS — F3289 Other specified depressive episodes: Secondary | ICD-10-CM | POA: Insufficient documentation

## 2014-01-12 DIAGNOSIS — C060 Malignant neoplasm of cheek mucosa: Secondary | ICD-10-CM | POA: Insufficient documentation

## 2014-01-12 HISTORY — DX: Depression, unspecified: F32.A

## 2014-01-12 HISTORY — DX: Major depressive disorder, single episode, unspecified: F32.9

## 2014-01-12 NOTE — Telephone Encounter (Addendum)
Called patient's mental health counselor, Serafina Mitchell [phone]) to confirm patient's arrival for tomorrow's 10:30 nurse eval and 11:00 appt with Dr. Lisbeth Renshaw.  LVM requesting return call.  Gayleen Orem, RN, BSN, [facility] Ne[facility] Navigator [phone]

## 2014-01-12 NOTE — Progress Notes (Signed)
Please see the Nurse Progress Note in the MD Initial Consult Encounter for this patient. 

## 2014-01-12 NOTE — Progress Notes (Addendum)
Met with patient and her case mgr ([facility], [place]) during initial consult with Dr. Lisbeth Renshaw.  Introduced myself as her navigator, explained my role as a member of her care team, provided contact information, encouraged them to call with any questions or concerns as appts and tmts begin.  The verbalized understanding.  Supported Dr. Ida Rogue explanation of RT by showing patient full-faced mask.  Provide tour of SIM and LINAC 4 to familiarize patient for upcoming tmts.  Showed them location of Dr. Ritta Slot office and [facility] for when they return for future appts.   Patient verbalized preference for morning appts after 10:00, case mgr confirmed that preferred phone number to schedule/confirm appts is [phone] which is front desk at [place].  I updated pt demographics with this information.  Gayleen Orem, RN, BSN, [facility] Head & Neck Oncology Navigator [phone]

## 2014-01-13 ENCOUNTER — Telehealth: Payer: Self-pay | Admitting: *Deleted

## 2014-01-13 DIAGNOSIS — C069 Malignant neoplasm of mouth, unspecified: Secondary | ICD-10-CM | POA: Insufficient documentation

## 2014-01-13 NOTE — Progress Notes (Addendum)
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 female who is seen for an initial consultation visit. The patient indicates that she had a nodule in the right cheek region for quite a long time. She is unsure of the duration but indicates that this was present for years. She noted slow growth of this tumor but denies other symptoms related to this. No pain. The patient indicates that friends told her that she needed to get this checked out and therefore she sought medical attention for this.  This represented a firm mass within the right cheek which was anterior to the parotid gland. A separate mass was also noted in the right submandibular triangle region. An FNA of the right submandibular mass was performed as well as a biopsy of the right cheek mass through the buccal mucosa. Pathology returned positive for polymorphous low-grade adenocarcinoma.  A CT scan of the neck was performed. This demonstrated a mass in the region of the right buccal region measuring 5.0 x 3.5 x 5.7 cm. A mass lateral to the right submandibular gland was also present measuring 4.1 x 2.9 x 3.9 cm. An additional single enlarged level I lymph node was present anterior to the submandibular lesion measuring 14 mm.  The patient was seen at [facility] and proceeded with resection on 10/19/2013. This involved a composite resection of the right buccal space tumor as well as a right modified radical neck dissection including levels 1, 2, and 3. Final pathology returned positive for polymorphous low-grade adenocarcinoma. The primary mass measured 5.5 cm in maximum dimension. The  tumor was positive for perineural invasion and lymphovascular invasion. The margins were negative for malignancy. The evaluation of the lymph nodes revealed 1 positive lymph node out of a total of 25. This lymph node measured 5.1 cm and was positive for extracapsular extension. Pathologically this represented a T3N2a tumor.  The patient indicates that she has done well postoperatively. She denies any significant ongoing pain. No difficulty swallowing. The incision sites are well healed. [name] for consideration of possible postoperative radiation treatment.   PREVIOUS RADIATION THERAPY: No   PAST MEDICAL HISTORY:  has a past medical history of Psychiatric disorder and Depression.     PAST SURGICAL HISTORY:History reviewed. No pertinent past surgical history.   FAMILY HISTORY: family history is not on file.   SOCIAL HISTORY:  reports that she has been smoking Cigarettes.  She has a 24.5 pack-year smoking history. She has never used smokeless tobacco. She reports that she drinks about 1.2 ounces of alcohol per week. She reports that she does not use illicit drugs.   ALLERGIES: Review of patient's allergies indicates no known allergies.   MEDICATIONS:  Current Outpatient Prescriptions  Medication Sig Dispense Refill  . benztropine (COGENTIN) 1 MG tablet Take 1 mg by mouth as needed for tremors.      Marland Kitchen UNABLE TO FIND Inject 200 mg as directed every 30 (thirty) days. Med Name: Haldol inject IM      . haloperidol lactate (HALDOL) 5 MG/ML injection *patient claims she gets a haloperidol injection once a month and  that she gets 200mg  dose injection. Can't remember where she gets it or what pharmacy she goes to*      . traMADol (ULTRAM) 50 MG tablet Take 1 tablet (50 mg total) by mouth every 6 (six) hours as needed for pain.  15 tablet  0   No current facility-administered medications for this encounter.     REVIEW OF SYSTEMS:  A 15 point review of  systems is documented in the electronic medical record. This was obtained by the nursing staff. However, I reviewed this with the patient to discuss relevant findings and make appropriate changes.  Pertinent items are noted in HPI.    PHYSICAL EXAM:  weight is 209 lb (94.802 kg). Her temperature is 98 F (36.7 C). Her blood pressure is 152/91 and her pulse is 76. Her oxygen saturation is 99%.   ECOG = 1  0 - Asymptomatic (Fully active, able to carry on all predisease activities without restriction)  1 - Symptomatic but completely ambulatory (Restricted in physically strenuous activity but ambulatory and able to carry out work of a light or sedentary nature. For example, light housework, office work)  2 - Symptomatic, <50% in bed during the day (Ambulatory and capable of all self care but unable to carry out any work activities. Up and about more than 50% of waking hours)  3 - Symptomatic, >50% in bed, but not bedbound (Capable of only limited self-care, confined to bed or chair 50% or more of waking hours)  4 - Bedbound (Completely disabled. Cannot carry on any self-care. Totally confined to bed or chair)  5 - Death   Eustace Pen MM, Creech RH, Tormey DC, et al. [phone]). "Toxicity and response criteria of the [facility]". [place] Oncol. 5 (6): 649-55  General: Well-developed, in no acute distress HEENT: oral cavity clear status post resection/reconstruction of the right buccal mucosa. No sign of recurrence. Facial movements shows some drooping on the right. Neck: Supple without any lymphadenopathy; well-healed surgical incision present in the upper right neck. No nodularity. No cervical adenopathy present. Cardiovascular: Regular rate and rhythm Respiratory: Clear to auscultation bilaterally GI: Soft, nontender, normal bowel sounds Extremities: No edema present      LABORATORY DATA:  Lab Results  Component Value Date   WBC 8.6 09/01/2013   HGB 13.7 09/01/2013    HCT 39.5 09/01/2013   MCV 99.0 09/01/2013   PLT 226 09/01/2013   Lab Results  Component Value Date   NA 137 09/01/2013   K 4.4 09/01/2013   CL 106 09/01/2013   CO2 23 09/01/2013   Lab Results  Component Value Date   ALT 10 09/01/2013   AST 15 09/01/2013   ALKPHOS 67 09/01/2013   BILITOT 0.4 09/01/2013      RADIOGRAPHY: No results found.     IMPRESSION: The patient is a pleasant 63 year old female who is status post resection of a right buccal tumor which returned positive for a polymorphous low-grade adenocarcinoma. This represented a T3N2aM0 tumor with a large involved lymph node with extracapsular extension within the right neck. The patient is at increased risk for local/regional failure given the specifics of her case. I discussed with her my recommendation for her to proceed with adjuvant radiation treatment.  I discussed with the patient the rationale/potential benefit from such a treatment. We also discussed the potential side effects and risks of treatment as well. All of her questions were answered. We also discussed further appointments which we'll be scheduled prior  to beginning radiation treatment, including dental evaluation and being seen by speech therapy. I believe holding off on placement of a feeding tube would be appropriate for her case. This issue can be revisited if necessary as she proceeds with treatment.     PLAN: The patient will be scheduled for a simulation such that we can proceed with treatment planning. Dental valuation and speech therapy referrals are pending.  I anticipate treating the patient to a dose of 66 gray in 33 fractions. The target volume will include the postoperative site and high-risk right-sided lymph node regions.   I spent 60 minutes face to face with the patient and more than 50% of that time was spent in counseling and/or coordination of care.    ________________________________   Jodelle Gross, MD, PhD

## 2014-01-13 NOTE — Telephone Encounter (Signed)
CALLED PATIENT TO INFORM OF APPT. FOR SPEECH THERAPY ON 01-24-14- ARRIVAL TIME - 3:15 PM WITH DR. Roscoe, SPOKE WITH CARE PROVIDER AND SHE IS AWARE OF THIS APPT.

## 2014-01-13 NOTE — Addendum Note (Signed)
Encounter addended by: Marye Round, MD on: 01/13/2014 11:11 AM<BR>     Documentation filed: Orders

## 2014-01-14 ENCOUNTER — Telehealth (HOSPITAL_COMMUNITY): Payer: Self-pay | Admitting: Dentistry

## 2014-01-14 ENCOUNTER — Other Ambulatory Visit (HOSPITAL_COMMUNITY): Payer: No Typology Code available for payment source | Admitting: Dentistry

## 2014-01-14 NOTE — Telephone Encounter (Signed)
Friday, January 14, 2014 at 10:17:44 AM  Patient:            Lorraine Tapia Date of Birth:  Dec 13, 1950 MRN:                [id number]  Broken appt. No show. No call. [facility] was called regarding the broken appointment. Facility stated the patient was not at location for pick up when transportation arrived. Patient was then rescheduled for 01/19/14 @12 :45 PM.  Lattie Haw Ingram/Dr. Enrique Sack

## 2014-01-19 ENCOUNTER — Other Ambulatory Visit (HOSPITAL_COMMUNITY): Payer: No Typology Code available for payment source | Admitting: Dentistry

## 2014-01-20 ENCOUNTER — Telehealth: Payer: Self-pay | Admitting: *Deleted

## 2014-01-20 NOTE — Telephone Encounter (Signed)
Spoke with Crystal at [employer] and confirmed that patient will have transportation and accompaniment for her 10:00 appt tomorrow with Dr. Enrique Sack and for her 3:30 2/23 appt with Garald Balding.  Gayleen Orem, RN, BSN, [facility] Head & Neck Oncology Navigator [phone]

## 2014-01-21 ENCOUNTER — Other Ambulatory Visit (HOSPITAL_COMMUNITY): Payer: Self-pay | Admitting: Dentistry

## 2014-01-21 ENCOUNTER — Encounter (HOSPITAL_COMMUNITY): Payer: Self-pay | Admitting: Dentistry

## 2014-01-21 ENCOUNTER — Ambulatory Visit (HOSPITAL_COMMUNITY): Payer: Self-pay | Admitting: Dentistry

## 2014-01-21 VITALS — BP 172/97 | HR 64 | Temp 97.7°F

## 2014-01-21 DIAGNOSIS — K029 Dental caries, unspecified: Secondary | ICD-10-CM

## 2014-01-21 DIAGNOSIS — K036 Deposits [accretions] on teeth: Secondary | ICD-10-CM

## 2014-01-21 DIAGNOSIS — M264 Malocclusion, unspecified: Secondary | ICD-10-CM

## 2014-01-21 DIAGNOSIS — K0530 Chronic periodontitis, unspecified: Secondary | ICD-10-CM

## 2014-01-21 DIAGNOSIS — K0889 Other specified disorders of teeth and supporting structures: Secondary | ICD-10-CM

## 2014-01-21 DIAGNOSIS — K045 Chronic apical periodontitis: Secondary | ICD-10-CM

## 2014-01-21 DIAGNOSIS — K08409 Partial loss of teeth, unspecified cause, unspecified class: Secondary | ICD-10-CM

## 2014-01-21 DIAGNOSIS — Z0189 Encounter for other specified special examinations: Secondary | ICD-10-CM

## 2014-01-21 DIAGNOSIS — IMO0002 Reserved for concepts with insufficient information to code with codable children: Secondary | ICD-10-CM

## 2014-01-21 DIAGNOSIS — C060 Malignant neoplasm of cheek mucosa: Secondary | ICD-10-CM

## 2014-01-21 NOTE — Patient Instructions (Signed)

## 2014-01-21 NOTE — Progress Notes (Signed)
DENTAL CONSULTATION  Date of Consultation:  01/21/2014 Patient Name:   Lorraine Tapia Date of Birth:   08-17-1951 Medical Record Number: [id number]  VITALS: BP 172/97  Pulse 64  Temp(Src) 97.7 F (36.5 C) (Oral)   CHIEF COMPLAINT: Patient was referred for a preradiation therapy dental consultation protocol examination.  HPI: Lorraine Tapia is a 63 year old female recently diagnosed with polymorphous adenocarcinoma of the right buccal mucosa. Patient underwent composite resection of the bucket mucosa, right modified neck dissection, and flap reconstruction with Dr. Fredricka Bonine at [facility] on 10/19/2013. Patient was recently seen by Dr. Lisbeth Renshaw for anticipated postoperative radiation therapy. Patient now seen as part of a medically necessary preradiation therapy dental protocol examination.  The patient currently denies acute toothache, swellings, or abscesses. Patient has not seen a dentist for "many years". Patient was last seen to have several teeth pulled. Patient denies having any complications from those dental extractions. Patient can not remember the name of the dentist. Patient does not have partial dentures at this time.   PROBLEM LIST: Patient Active Problem List   Diagnosis Date Noted  . Polymorphous grade adenocarcinoma of the right buccal mucosa 01/21/2014    Priority: High  . Malignant neoplasm of mouth, unspecified site 01/13/2014  . Adenocarcinoma 09/01/2013  . Mass of buccal mucosa 07/05/2013  . Mass of submandibular gland 07/05/2013  . Maxillary sinus mass 07/05/2013    PMH: Past Medical History  Diagnosis Date  . Psychiatric disorder   . Depression   . Bipolar disorder     PSH: Past Surgical History  Procedure Laterality Date  . Right buccal mucosa composite resection  10/19/2013    S/P right buccal mucosa composite resection with flap reconstruction and right modified neck dissection on 10/20/2039 by Dr. Fredricka Bonine at  [place]: No Known Allergies  MEDICATIONS: Current Outpatient Prescriptions  Medication Sig Dispense Refill  . haloperidol lactate (HALDOL) 5 MG/ML injection *patient claims she gets a haloperidol injection once a month and that she gets 200mg  dose injection. Can't remember where she gets it or what pharmacy she goes to*      . benztropine (COGENTIN) 1 MG tablet Take 1 mg by mouth as needed for tremors.      . traMADol (ULTRAM) 50 MG tablet Take 1 tablet (50 mg total) by mouth every 6 (six) hours as needed for pain.  15 tablet  0  . UNABLE TO FIND Inject 200 mg as directed every 30 (thirty) days. Med Name: Haldol inject IM       No current facility-administered medications for this visit.    LABS: Lab Results  Component Value Date   WBC 8.6 09/01/2013   HGB 13.7 09/01/2013   HCT 39.5 09/01/2013   MCV 99.0 09/01/2013   PLT 226 09/01/2013      Component Value Date/Time   NA 137 09/01/2013 1257   K 4.4 09/01/2013 1257   CL 106 09/01/2013 1257   CO2 23 09/01/2013 1257   GLUCOSE 79 09/01/2013 1257   BUN 22 09/01/2013 1257   CREATININE 0.67 09/01/2013 1257   CREATININE 0.52 10/07/2011 1947   CALCIUM 9.4 09/01/2013 1257   GFRNONAA >90 10/07/2011 1947   GFRAA >90 10/07/2011 1947   No results found for this basename: INR, PROTIME   No results found for this basename: PTT    SOCIAL HISTORY: History   Social History  . Marital Status: Married    Spouse Name: N/A  Number of Children: N/A  . Years of Education: N/A   Occupational History  .      Disability   Social History Main Topics  . Smoking status: Current Every Day Smoker -- 0.50 packs/day for 49 years    Types: Cigarettes  . Smokeless tobacco: Never Used  . Alcohol Use: 1.2 oz/week    2 Cans of beer per week     Comment:  2 24  oz beer occasionally  . Drug Use: No  . Sexual Activity: Yes   Other Topics Concern  . Not on file   Social History Narrative  . No narrative on file    FAMILY  HISTORY: Family History  Problem Relation Age of Onset  . Cancer Mother   . Hypertension Father      REVIEW OF SYSTEMS: Reviewed with patient and included in dental record.   DENTAL HISTORY: CHIEF COMPLAINT: Patient was referred for a preradiation therapy dental consultation protocol examination.  HPI: Lorraine Tapia is a 63 year old female recently diagnosed with polymorphous adenocarcinoma of the right buccal mucosa. Patient underwent composite resection of the bucket mucosa, right modified neck dissection, and flap reconstruction with Dr. Fredricka Bonine at [facility] on 10/19/2013. Patient was recently seen by Dr. Lisbeth Renshaw for anticipated postoperative radiation therapy. Patient now seen as part of a medically necessary preradiation therapy dental protocol examination.  The patient currently denies acute toothache, swellings, or abscesses. Patient has not seen a dentist for "many years". Patient was last seen to have several teeth pulled. Patient denies having any complications from those dental extractions. Patient can not remember the name of the dentist. Patient does not have partial dentures at this time.   DENTAL EXAMINATION:  GENERAL: The patient is a well-developed, well-nourished female in no acute distress. HEAD AND NECK: Patient has right neck with previous modified radical neck dissection. There is no palpable left neck lymphadenopathy. Patient denies acute TMJ symptoms. Patient appears to have right facial droop secondary to previous surgical resection. INTRAORAL EXAM: The patient has normal saliva. I do not see any evidence of abscess formation. The right cheek is consistent with previous surgical resection with flap reconstruction. DENTITION: The patient has multiple missing teeth numbers 1 through 5, 9, 13, 14, 17, 18, 19, 20, 21, 24, 28, 29, 30, 31, and 32. PERIODONTAL: The patient has chronic, advanced periodontal disease with plaque and calculus  accumulations, generalized gingival recession, generalized extensive tooth mobility.  DENTAL CARIES/SUBOPTIMAL RESTORATIONS: The patient has dental caries associated with tooth #16. ENDODONTIC: There appears to be periapical pathology associated with tooth #16.  CROWN AND BRIDGE: There are no crown or bridge restorations.  PROSTHODONTIC: The patient denies having any partial dentures.  The patient will require an evaluation by an oral and maxillofacial prosthodontist for future prosthodontics rehabilitation. OCCLUSION: The patient has a poor occlusal scheme secondary to multiple missing teeth, supra-eruption and drifting of the unopposed teeth into the edentulous areas, and lack of replacement of the missing teeth with dental prostheses.   RADIOGRAPHIC INTERPRETATION: An orthopantogram was obtained today.  There are multiple missing teeth. There are multiple diastemas. There is supra-eruption and drifting of the unopposed teeth into the edentulous areas. There is moderate to severe bone loss noted. Dental caries are noted. There is periapical pathology associated with the apex of tooth #16.   ASSESSMENTS: 1. Polymorphous adenocarcinoma of the right buccal mucosa-status post surgical resection and reconstruction  2. Pre- radiation therapy dental protocol examination 3. Chronic apical periodontitis 4.  Chronic periodontitis with bone loss 5. Generalized gingival recession 6. Generalized tooth mobility 7. Plaque and calculus accumulations 8. Dental caries 9. Multiple missing teeth  10. 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 Malocclusion  PLAN/RECOMMENDATIONS: 1. I discussed the risks, benefits, and complications of various treatment options with the patient in relationship to her medical and dental conditions, anticipated duration therapy, and radiation therapy side effects to include xerostomia, radiation caries, trismus, mucositis, taste changes, gum and jawbone changes,  and risk for infection and osteoradionecrosis. We discussed various treatment options to include no treatment, multiple extractions with alveoloplasty, pre-prosthetic surgery as indicated, periodontal therapy, dental restorations, root canal therapy, crown and bridge therapy, implant therapy, and replacement of missing teeth as indicated. The patient currently wishes to proceed with  extraction remaining teeth with alveoloplasty and pre-prosthetic surgery as indicated in the operating room on 02/01/2014 at 10 AM at [facility]. Presurgical testing has been arranged for Thursday, 01/27/2014 at a 9 AM . Patient most likely will benefit from 23 hour admission and this will be arranged with [facility] . The patient will need to followup with an oral and maxillofacial prosthodontist for evaluation for prosthodontics rehabilitation after the radiation therapy (3 months). The patient may not be a candidate for conventional upper lower complete dentures due to the surgical resection and flap reconstruction.  2. Discussion of findings with medical team and coordination of future medical and dental care as needed.  I spent 75 minutes face to face with patient and more than 50% of time was spent in counseling and /or coordination of care.   Lenn Cal, DDS

## 2014-01-24 ENCOUNTER — Ambulatory Visit: Payer: Medicaid Other | Attending: Radiation Oncology

## 2014-01-24 ENCOUNTER — Ambulatory Visit: Payer: No Typology Code available for payment source

## 2014-01-26 ENCOUNTER — Other Ambulatory Visit: Payer: Self-pay | Admitting: *Deleted

## 2014-01-26 NOTE — Progress Notes (Signed)
Talked with Meyer Cory, transportation and Serafina Mitchell, case manager both notified of change in patient's appointment to 02/01/14 at 0900 am and they will inform patient.

## 2014-01-27 ENCOUNTER — Other Ambulatory Visit (HOSPITAL_COMMUNITY): Payer: Self-pay

## 2014-01-28 ENCOUNTER — Telehealth: Payer: Self-pay | Admitting: *Deleted

## 2014-01-28 ENCOUNTER — Encounter (HOSPITAL_COMMUNITY): Payer: Self-pay | Admitting: Pharmacy Technician

## 2014-01-28 NOTE — Telephone Encounter (Signed)
Spoke with [place] at [employer].  Confirmed with her patient's Monday 3/2 appts:  9:00 with Dr. Enrique Sack (9:00 in [place], she indicated they have 8:30) and 10:30 with Dory Peru, RD; I verified locations, respectively.  Kristeen Miss stated that Ms. Hargrove will be accompanied by a Case Mgr.  I also informed her of patient's 3/11 SIM at [facility].  Gayleen Orem, RN, BSN, [facility] Head & Neck Oncology Navigator [phone]

## 2014-01-31 ENCOUNTER — Encounter (HOSPITAL_COMMUNITY): Payer: Self-pay

## 2014-01-31 ENCOUNTER — Encounter (HOSPITAL_COMMUNITY)
Admission: RE | Admit: 2014-01-31 | Discharge: 2014-01-31 | Disposition: A | Discharge status: Home / Self Care | Payer: Medicaid Other | Source: Ambulatory Visit | Attending: Dentistry | Admitting: Dentistry

## 2014-01-31 ENCOUNTER — Encounter: Payer: Self-pay | Admitting: Nutrition

## 2014-01-31 ENCOUNTER — Ambulatory Visit (HOSPITAL_COMMUNITY)
Admission: RE | Admit: 2014-01-31 | Discharge: 2014-01-31 | Disposition: A | Discharge status: Home / Self Care | Payer: Medicaid Other | Source: Ambulatory Visit | Attending: Dentistry | Admitting: Dentistry

## 2014-01-31 DIAGNOSIS — Z01818 Encounter for other preprocedural examination: Secondary | ICD-10-CM

## 2014-01-31 DIAGNOSIS — Z01812 Encounter for preprocedural laboratory examination: Secondary | ICD-10-CM

## 2014-01-31 HISTORY — DX: Malignant (primary) neoplasm, unspecified: C80.1

## 2014-01-31 HISTORY — DX: Shortness of breath: R06.02

## 2014-01-31 LAB — CBC
HCT: 39.6 % (ref 36.0–46.0)
HEMOGLOBIN: 13.5 g/dL (ref 12.0–15.0)
MCH: 34.5 pg — ABNORMAL HIGH (ref 26.0–34.0)
MCHC: 34.1 g/dL (ref 30.0–36.0)
MCV: 101.3 fL — ABNORMAL HIGH (ref 78.0–100.0)
PLATELETS: 199 10*3/uL (ref 150–400)
RBC: 3.91 MIL/uL (ref 3.87–5.11)
RDW: 13.8 % (ref 11.5–15.5)
WBC: 8.1 10*3/uL (ref 4.0–10.5)

## 2014-01-31 NOTE — Progress Notes (Signed)
Patient did not show up for nutrition appointment scheduled March 2.  Navigator is aware and will attempt to reschedule for patient convenience.

## 2014-01-31 NOTE — Patient Instructions (Signed)
[address]  01/31/2014   Your procedure is scheduled on:  Tuesday February 01, 2014  Report to [place] at 730  AM.  Call this number if you have problems the morning of surgery [phone]   Remember:  Do not eat food or drink liquids :After Midnight.     Take these medicines the morning of surgery with A SIP OF WATER: no meds to take                                SEE Amelia may not have any metal on your body including hair pins and piercings  Do not wear jewelry, make-up.  Do not wear lotions, powders, or perfumes. No  Deodorant is to be worn.   Men may shave face and neck.  Do not bring valuables to the hospital. [place].  Contacts, dentures or bridgework may not be worn into surgery.  Leave suitcase in the car. After surgery it may be brought to your room.  For patients admitted to the hospital, checkout time is 11:00 AM the day of discharge.    Please read over the following fact sheets that you were given: [facility] Preparing for surgery sheet, MRSA information, incentive spirometer sheet, blood fact sheet  Call Zelphia Cairo RN pre op nurse if needed [phone]    [place].  PATIENT SIGNATURE___________________________________________  NURSE SIGNATURE_____________________________________________

## 2014-01-31 NOTE — Progress Notes (Signed)
Anesthesia record from 110-18-14 [facility] on chart

## 2014-01-31 NOTE — Progress Notes (Signed)
Paged dr rose for anesthesia consult ordered from dr Enrique Sack, no reponse from page x 20 minutes, pt instructed anesthesia will see patients am of surgery.

## 2014-02-01 ENCOUNTER — Ambulatory Visit (HOSPITAL_COMMUNITY): Payer: Medicaid Other | Admitting: Anesthesiology

## 2014-02-01 ENCOUNTER — Telehealth: Payer: Self-pay | Admitting: *Deleted

## 2014-02-01 ENCOUNTER — Encounter (HOSPITAL_COMMUNITY)
Admission: RE | Disposition: A | Discharge status: Home / Self Care | Payer: Self-pay | Source: Ambulatory Visit | Attending: Internal Medicine

## 2014-02-01 ENCOUNTER — Encounter (HOSPITAL_COMMUNITY): Payer: Self-pay | Admitting: *Deleted

## 2014-02-01 ENCOUNTER — Encounter (HOSPITAL_COMMUNITY): Payer: Medicaid Other | Admitting: Anesthesiology

## 2014-02-01 ENCOUNTER — Observation Stay (HOSPITAL_COMMUNITY)
Admission: RE | Admit: 2014-02-01 | Discharge: 2014-02-02 | Disposition: A | Discharge status: Home / Self Care | Payer: Medicaid Other | Source: Ambulatory Visit | Attending: Internal Medicine | Admitting: Internal Medicine

## 2014-02-01 DIAGNOSIS — C060 Malignant neoplasm of cheek mucosa: Principal | ICD-10-CM | POA: Insufficient documentation

## 2014-02-01 DIAGNOSIS — K118 Other diseases of salivary glands: Secondary | ICD-10-CM

## 2014-02-01 DIAGNOSIS — K0530 Chronic periodontitis, unspecified: Secondary | ICD-10-CM

## 2014-02-01 DIAGNOSIS — C069 Malignant neoplasm of mouth, unspecified: Secondary | ICD-10-CM

## 2014-02-01 DIAGNOSIS — R220 Localized swelling, mass and lump, head: Secondary | ICD-10-CM

## 2014-02-01 DIAGNOSIS — J3489 Other specified disorders of nose and nasal sinuses: Secondary | ICD-10-CM

## 2014-02-01 DIAGNOSIS — I10 Essential (primary) hypertension: Secondary | ICD-10-CM

## 2014-02-01 DIAGNOSIS — K1379 Other lesions of oral mucosa: Secondary | ICD-10-CM | POA: Diagnosis present

## 2014-02-01 DIAGNOSIS — K029 Dental caries, unspecified: Secondary | ICD-10-CM

## 2014-02-01 DIAGNOSIS — K045 Chronic apical periodontitis: Secondary | ICD-10-CM

## 2014-02-01 DIAGNOSIS — K0889 Other specified disorders of teeth and supporting structures: Secondary | ICD-10-CM | POA: Insufficient documentation

## 2014-02-01 DIAGNOSIS — K1370 Unspecified lesions of oral mucosa: Secondary | ICD-10-CM

## 2014-02-01 DIAGNOSIS — Z79899 Other long term (current) drug therapy: Secondary | ICD-10-CM | POA: Insufficient documentation

## 2014-02-01 DIAGNOSIS — F319 Bipolar disorder, unspecified: Secondary | ICD-10-CM | POA: Insufficient documentation

## 2014-02-01 DIAGNOSIS — C801 Malignant (primary) neoplasm, unspecified: Secondary | ICD-10-CM

## 2014-02-01 HISTORY — PX: MULTIPLE EXTRACTIONS WITH ALVEOLOPLASTY: SHX5342

## 2014-02-01 SURGERY — MULTIPLE EXTRACTION WITH ALVEOLOPLASTY
Anesthesia: General | Site: Mouth

## 2014-02-01 MED ORDER — PROPOFOL 10 MG/ML IV BOLUS
INTRAVENOUS | Status: AC
  Filled 2014-02-01: qty 20

## 2014-02-01 MED ORDER — ISOPROPYL ALCOHOL 70 % SOLN
Status: DC | PRN
  Administered 2014-02-01: 1 via TOPICAL

## 2014-02-01 MED ORDER — SUCCINYLCHOLINE CHLORIDE 20 MG/ML IJ SOLN
INTRAMUSCULAR | Status: DC | PRN
  Administered 2014-02-01: 100 mg via INTRAVENOUS

## 2014-02-01 MED ORDER — LIDOCAINE-EPINEPHRINE 2 %-1:100000 IJ SOLN
INTRAMUSCULAR | Status: AC
  Filled 2014-02-01: qty 6.8

## 2014-02-01 MED ORDER — OXYCODONE HCL 5 MG/5ML PO SOLN
5.0000 mg | Freq: Once | ORAL | Status: DC | PRN
  Filled 2014-02-01: qty 5

## 2014-02-01 MED ORDER — ACETAMINOPHEN 325 MG PO TABS: 650.0000 mg | ORAL_TABLET | Freq: Four times a day (QID) | ORAL | Status: DC | PRN

## 2014-02-01 MED ORDER — BUPIVACAINE-EPINEPHRINE PF 0.5-1:200000 % IJ SOLN
INTRAMUSCULAR | Status: AC
  Filled 2014-02-01: qty 3.6

## 2014-02-01 MED ORDER — LIDOCAINE HCL (CARDIAC) 20 MG/ML IV SOLN
INTRAVENOUS | Status: AC
  Filled 2014-02-01: qty 5

## 2014-02-01 MED ORDER — BIOTENE DRY MOUTH MT LIQD
15.0000 mL | Freq: Two times a day (BID) | OROMUCOSAL | Status: DC
  Administered 2014-02-01 – 2014-02-02 (×2): 15 mL via OROMUCOSAL

## 2014-02-01 MED ORDER — EPHEDRINE SULFATE 50 MG/ML IJ SOLN
INTRAMUSCULAR | Status: DC | PRN
  Administered 2014-02-01: 5 mg via INTRAVENOUS
  Administered 2014-02-01: 2.5 mg via INTRAVENOUS

## 2014-02-01 MED ORDER — PROMETHAZINE HCL 25 MG/ML IJ SOLN: 6.2500 mg | INTRAMUSCULAR | Status: DC | PRN

## 2014-02-01 MED ORDER — OXYMETAZOLINE HCL 0.05 % NA SOLN
NASAL | Status: AC
  Filled 2014-02-01: qty 15

## 2014-02-01 MED ORDER — PROPOFOL 10 MG/ML IV BOLUS
INTRAVENOUS | Status: DC | PRN
  Administered 2014-02-01: 175 mg via INTRAVENOUS
  Administered 2014-02-01: 100 mg via INTRAVENOUS

## 2014-02-01 MED ORDER — MIDAZOLAM HCL 2 MG/2ML IJ SOLN
INTRAMUSCULAR | Status: AC
  Filled 2014-02-01: qty 2

## 2014-02-01 MED ORDER — BUPIVACAINE-EPINEPHRINE PF 0.5-1:200000 % IJ SOLN
INTRAMUSCULAR | Status: DC | PRN
  Administered 2014-02-01: 3.6 mL

## 2014-02-01 MED ORDER — LIDOCAINE-EPINEPHRINE 2 %-1:100000 IJ SOLN
INTRAMUSCULAR | Status: DC | PRN
  Administered 2014-02-01: 6.8 mL

## 2014-02-01 MED ORDER — LIDOCAINE HCL (CARDIAC) 20 MG/ML IV SOLN
INTRAVENOUS | Status: DC | PRN
  Administered 2014-02-01: 75 mg via INTRAVENOUS

## 2014-02-01 MED ORDER — DEXAMETHASONE SODIUM PHOSPHATE 10 MG/ML IJ SOLN
INTRAMUSCULAR | Status: DC | PRN
  Administered 2014-02-01: 10 mg via INTRAVENOUS

## 2014-02-01 MED ORDER — LACTATED RINGERS IV SOLN: INTRAVENOUS | Status: DC

## 2014-02-01 MED ORDER — ISOPROPYL ALCOHOL 70 % SOLN
Status: AC
  Filled 2014-02-01: qty 480

## 2014-02-01 MED ORDER — NEOSTIGMINE METHYLSULFATE 1 MG/ML IJ SOLN
INTRAMUSCULAR | Status: AC
  Filled 2014-02-01: qty 10

## 2014-02-01 MED ORDER — GLYCOPYRROLATE 0.2 MG/ML IJ SOLN
INTRAMUSCULAR | Status: DC | PRN
  Administered 2014-02-01: 0.6 mg via INTRAVENOUS

## 2014-02-01 MED ORDER — SODIUM CHLORIDE 0.9 % IV SOLN
INTRAVENOUS | Status: DC
  Administered 2014-02-01: 15:00:00 via INTRAVENOUS

## 2014-02-01 MED ORDER — FENTANYL CITRATE 0.05 MG/ML IJ SOLN
INTRAMUSCULAR | Status: DC | PRN
Start: 2014-02-01 — End: 2014-02-01
  Administered 2014-02-01: 100 ug via INTRAVENOUS
  Administered 2014-02-01: 150 ug via INTRAVENOUS

## 2014-02-01 MED ORDER — MIDAZOLAM HCL 5 MG/5ML IJ SOLN
INTRAMUSCULAR | Status: DC | PRN
  Administered 2014-02-01: .5 mg via INTRAVENOUS
  Administered 2014-02-01: 1.5 mg via INTRAVENOUS

## 2014-02-01 MED ORDER — FENTANYL CITRATE 0.05 MG/ML IJ SOLN
INTRAMUSCULAR | Status: AC
  Filled 2014-02-01: qty 5

## 2014-02-01 MED ORDER — OXYCODONE HCL 5 MG PO TABS: 5.0000 mg | ORAL_TABLET | ORAL | Status: DC | PRN

## 2014-02-01 MED ORDER — OXYCODONE HCL 5 MG PO TABS: 5.0000 mg | ORAL_TABLET | Freq: Once | ORAL | Status: DC | PRN

## 2014-02-01 MED ORDER — MEPERIDINE HCL 50 MG/ML IJ SOLN: 6.2500 mg | INTRAMUSCULAR | Status: DC | PRN

## 2014-02-01 MED ORDER — CHLORHEXIDINE GLUCONATE 0.12 % MT SOLN
15.0000 mL | Freq: Two times a day (BID) | OROMUCOSAL | Status: DC
  Administered 2014-02-01 – 2014-02-02 (×2): 15 mL via OROMUCOSAL
  Filled 2014-02-01 (×4): qty 15

## 2014-02-01 MED ORDER — DEXAMETHASONE SODIUM PHOSPHATE 10 MG/ML IJ SOLN
INTRAMUSCULAR | Status: AC
  Filled 2014-02-01: qty 1

## 2014-02-01 MED ORDER — 0.9 % SODIUM CHLORIDE (POUR BTL) OPTIME
TOPICAL | Status: DC | PRN
  Administered 2014-02-01: 1000 mL

## 2014-02-01 MED ORDER — OXYMETAZOLINE HCL 0.05 % NA SOLN
1.0000 | Freq: Two times a day (BID) | NASAL | Status: DC
  Administered 2014-02-01: 1 via NASAL
  Filled 2014-02-01: qty 15

## 2014-02-01 MED ORDER — CISATRACURIUM BESYLATE (PF) 10 MG/5ML IV SOLN
INTRAVENOUS | Status: DC | PRN
  Administered 2014-02-01: 4 mg via INTRAVENOUS
  Administered 2014-02-01: 8 mg via INTRAVENOUS

## 2014-02-01 MED ORDER — MORPHINE SULFATE 2 MG/ML IJ SOLN: 2.0000 mg | INTRAMUSCULAR | Status: DC | PRN

## 2014-02-01 MED ORDER — ONDANSETRON HCL 4 MG/2ML IJ SOLN
INTRAMUSCULAR | Status: DC | PRN
  Administered 2014-02-01: 4 mg via INTRAVENOUS

## 2014-02-01 MED ORDER — HYDROMORPHONE HCL PF 1 MG/ML IJ SOLN: 0.2500 mg | INTRAMUSCULAR | Status: DC | PRN

## 2014-02-01 MED ORDER — CISATRACURIUM BESYLATE 20 MG/10ML IV SOLN
INTRAVENOUS | Status: AC
  Filled 2014-02-01: qty 10

## 2014-02-01 MED ORDER — ACETAMINOPHEN 650 MG RE SUPP: 650.0000 mg | Freq: Four times a day (QID) | RECTAL | Status: DC | PRN

## 2014-02-01 MED ORDER — GLYCOPYRROLATE 0.2 MG/ML IJ SOLN
INTRAMUSCULAR | Status: AC
  Filled 2014-02-01: qty 3

## 2014-02-01 MED ORDER — LACTATED RINGERS IV SOLN
INTRAVENOUS | Status: DC
  Administered 2014-02-01: 1000 mL via INTRAVENOUS
  Administered 2014-02-01: 11:00:00 via INTRAVENOUS

## 2014-02-01 MED ORDER — AMLODIPINE BESYLATE 5 MG PO TABS
5.0000 mg | ORAL_TABLET | Freq: Every day | ORAL | Status: DC
  Administered 2014-02-01 – 2014-02-02 (×2): 5 mg via ORAL
  Filled 2014-02-01 (×2): qty 1

## 2014-02-01 MED ORDER — SORBITOL 70 % SOLN: 30.0000 mL | Freq: Every day | Status: DC | PRN

## 2014-02-01 MED ORDER — ONDANSETRON HCL 4 MG/2ML IJ SOLN
INTRAMUSCULAR | Status: AC
  Filled 2014-02-01: qty 2

## 2014-02-01 MED ORDER — NEOSTIGMINE METHYLSULFATE 1 MG/ML IJ SOLN
INTRAMUSCULAR | Status: DC | PRN
  Administered 2014-02-01: 4 mg via INTRAVENOUS

## 2014-02-01 MED ORDER — CEFAZOLIN SODIUM-DEXTROSE 2-3 GM-% IV SOLR
2.0000 g | Freq: Once | INTRAVENOUS | Status: AC
  Administered 2014-02-01: 2 g via INTRAVENOUS

## 2014-02-01 MED ORDER — CEFAZOLIN SODIUM-DEXTROSE 2-3 GM-% IV SOLR
INTRAVENOUS | Status: AC
  Filled 2014-02-01: qty 50

## 2014-02-01 MED ORDER — LIDOCAINE-EPINEPHRINE 2 %-1:100000 IJ SOLN
INTRAMUSCULAR | Status: AC
  Filled 2014-02-01: qty 3.4

## 2014-02-01 SURGICAL SUPPLY — 25 items
ATTRACTOMAT 16X20 MAGNETIC DRP (DRAPES) ×2 IMPLANT
BAG SPEC THK2 15X12 ZIP CLS (MISCELLANEOUS) ×1
BAG ZIPLOCK 12X15 (MISCELLANEOUS) ×2 IMPLANT
BANDAGE EYE OVAL (MISCELLANEOUS) IMPLANT
BLADE SURG 15 STRL LF DISP TIS (BLADE) ×2 IMPLANT
BLADE SURG 15 STRL SS (BLADE) ×4
CANNULA VESSEL W/WING WO/VALVE (CANNULA) ×4 IMPLANT
GAUZE SPONGE 4X4 16PLY XRAY LF (GAUZE/BANDAGES/DRESSINGS) ×2 IMPLANT
GLOVE SURG ORTHO 8.0 STRL STRW (GLOVE) ×2 IMPLANT
GLOVE SURG SS PI 6.5 STRL IVOR (GLOVE) ×2 IMPLANT
GOWN STRL REUS W/TWL 2XL LVL3 (GOWN DISPOSABLE) ×2 IMPLANT
GOWN STRL REUS W/TWL LRG LVL3 (GOWN DISPOSABLE) ×2 IMPLANT
KIT BASIN OR (CUSTOM PROCEDURE TRAY) ×2 IMPLANT
NS IRRIG 1000ML POUR BTL (IV SOLUTION) ×1 IMPLANT
PACK EENT SPLIT (PACKS) ×2 IMPLANT
PACKING VAGINAL (PACKING) ×2 IMPLANT
SPONGE GAUZE 4X4 12PLY (GAUZE/BANDAGES/DRESSINGS) ×2 IMPLANT
SUCTION FRAZIER 12FR DISP (SUCTIONS) ×2 IMPLANT
SUT CHROMIC 3 0 PS 2 (SUTURE) ×8 IMPLANT
SUT CHROMIC 4 0 P 3 18 (SUTURE) ×1 IMPLANT
SYR 50ML LL SCALE MARK (SYRINGE) ×2 IMPLANT
TOWEL OR 17X26 10 PK STRL BLUE (TOWEL DISPOSABLE) ×2 IMPLANT
TUBING CONNECTING 10 (TUBING) ×2 IMPLANT
WATER STERILE IRR 1500ML POUR (IV SOLUTION) ×1 IMPLANT
YANKAUER SUCT BULB TIP NO VENT (SUCTIONS) ×2 IMPLANT

## 2014-02-01 NOTE — Transfer of Care (Signed)
Immediate Anesthesia Transfer of Care Note  Patient: JAQUELIN MEANEY  Procedure(s) Performed: Procedure(s) (LRB): Extraction of tooth #'s 6,7,8,10,11,12,15,16,22,23,25,26,27 with alveoloplasty. (N/A)  Patient Location: PACU  Anesthesia Type: General  Level of Consciousness: sedated, patient cooperative and responds to stimulation  Airway & Oxygen Therapy: Patient Spontanous Breathing and Patient connected to face mask oxgen  Post-op Assessment: Report given to PACU RN and Post -op Vital signs reviewed and stable  Post vital signs: Reviewed and stable  Complications: No apparent anesthesia complications

## 2014-02-01 NOTE — Progress Notes (Signed)
PRE-OPERATIVE NOTE:  02/01/2014 Lorraine Tapia [id number]  VITALS: BP 157/77  Pulse 67  Temp(Src) 98 F (36.7 C) (Oral)  Resp 18  SpO2 100%  Lab Results  Component Value Date   WBC 8.1 01/31/2014   HGB 13.5 01/31/2014   HCT 39.6 01/31/2014   MCV 101.3* 01/31/2014   PLT 199 01/31/2014   BMET    Component Value Date/Time   NA 137 09/01/2013 1257   K 4.4 09/01/2013 1257   CL 106 09/01/2013 1257   CO2 23 09/01/2013 1257   GLUCOSE 79 09/01/2013 1257   BUN 22 09/01/2013 1257   CREATININE 0.67 09/01/2013 1257   CREATININE 0.52 10/07/2011 1947   CALCIUM 9.4 09/01/2013 1257   GFRNONAA >90 10/07/2011 1947   GFRAA >90 10/07/2011 1947    No results found for this basename: INR, PROTIME   No results found for this basename: PTT     .Lorraine Tapia presents for multiple extraction of remaining teeth with alveoloplasty and pre-prosthetic surgery as indicated in the operating room with general anesthesia.    SUBJECTIVE: The patient denies any acute medical or dental changes and agrees to proceed with treatment as planned.  EXAM: No sign of acute dental changes.  ASSESSMENT: Patient is affected by history of polymorphic is adenocarcinoma of the right buccal mucosa-status post surgical resection and reconstruction, preradiation therapy dental protocol, chronic apical periodontitis, chronic periodontitis, dental caries, tooth mobility.  PLAN: Patient agrees to proceed with treatment as planned in the operating room as previously discussed and accepts the risks, benefits, complications of the proposed treatment. We previously discussed risk for bleeding, infection, swelling, bruising, nerve damage, sinus involvement, root tip fracture, mandible fracture, and other anesthesia complications. Patient also is aware of other potential complications not necessarily mentioned above.   Lenn Cal, DDS

## 2014-02-01 NOTE — Anesthesia Postprocedure Evaluation (Signed)
Anesthesia Post Note  Patient: Lorraine Tapia  Procedure(s) Performed: Procedure(s) (LRB): Extraction of tooth #'s 6,7,8,10,11,12,15,16,22,23,25,26,27 with alveoloplasty. (N/A)  Anesthesia type: General  Patient location: PACU  Post pain: Pain level controlled  Post assessment: Post-op Vital signs reviewed  Last Vitals: BP 176/71  Pulse 55  Temp(Src) 36.8 C (Oral)  Resp 16  Ht 5\' 10"  (1.778 m)  Wt 213 lb (96.616 kg)  BMI 30.56 kg/m2  SpO2 100%  Post vital signs: Reviewed  Level of consciousness: sedated  Complications: No apparent anesthesia complications

## 2014-02-01 NOTE — Anesthesia Preprocedure Evaluation (Signed)
Anesthesia Evaluation  Patient identified by MRN, date of birth, ID band Patient awake    Reviewed: Allergy & Precautions, H&P , NPO status , Patient's Chart, lab work & pertinent test results  Airway Mallampati: II TM Distance: >3 FB Neck ROM: Full    Dental  (+) Dental Advisory Given   Pulmonary shortness of breath, Current Smoker,  breath sounds clear to auscultation        Cardiovascular negative cardio ROS  Rhythm:Regular Rate:Normal     Neuro/Psych PSYCHIATRIC DISORDERS Depression negative neurological ROS     GI/Hepatic negative GI ROS, Neg liver ROS,   Endo/Other  negative endocrine ROS  Renal/GU negative Renal ROS     Musculoskeletal negative musculoskeletal ROS (+)   Abdominal   Peds  Hematology negative hematology ROS (+)   Anesthesia Other Findings   Reproductive/Obstetrics negative OB ROS                           Anesthesia Physical Anesthesia Plan  ASA: II  Anesthesia Plan: General   Post-op Pain Management:    Induction: Intravenous  Airway Management Planned: Nasal ETT  Additional Equipment:   Intra-op Plan:   Post-operative Plan: Extubation in OR  Informed Consent: I have reviewed the patients History and Physical, chart, labs and discussed the procedure including the risks, benefits and alternatives for the proposed anesthesia with the patient or authorized representative who has indicated his/her understanding and acceptance.   Dental advisory given  Plan Discussed with: CRNA  Anesthesia Plan Comments:         Anesthesia Quick Evaluation

## 2014-02-01 NOTE — Anesthesia Procedure Notes (Signed)
Procedure Name: Intubation Date/Time: 02/01/2014 10:34 AM Performed by: Ofilia Neas Pre-anesthesia Checklist: Patient identified, Patient being monitored, Timeout performed, Emergency Drugs available and Suction available Patient Re-evaluated:Patient Re-evaluated prior to inductionOxygen Delivery Method: Circle system utilized Preoxygenation: Pre-oxygenation with 100% oxygen Intubation Type: IV induction and Cricoid Pressure applied Ventilation: Mask ventilation without difficulty Laryngoscope Size: Mac and 4 Grade View: Grade I Tube type: Glide rite Tube size: 7.5 mm Number of attempts: 1 Airway Equipment and Method: Video-laryngoscopy and Stylet (elective glidescope due to oral cancer surgery, facial swelling right side) Placement Confirmation: ETT inserted through vocal cords under direct vision,  positive ETCO2 and breath sounds checked- equal and bilateral Secured at: 21 cm Tube secured with: Tape Dental Injury: Teeth and Oropharynx as per pre-operative assessment  Difficulty Due To: Difficulty was anticipated, Difficult Airway- due to large tongue, Difficult Airway- due to dentition and Difficult Airway-  due to edematous airway Future Recommendations: Recommend- induction with short-acting agent, and alternative techniques readily available

## 2014-02-01 NOTE — Progress Notes (Signed)
Pt states she drank full coffee mug of water at [place].  Dr. Lissa Hoard notified. No orders given

## 2014-02-01 NOTE — H&P (Signed)
[facility] History and Physical  Lorraine Tapia HCW:[id number] DOB: May 20, 1951 DOA: 02/01/2014  Referring physician:  PCP: Angelica Chessman, MD   Chief Complaint: Dental extraction  HPI: Lorraine Tapia is a 63 y.o. female with a past medical history of polymorphous adenocarcinoma of right buccal mucosa, status post resection with right modified neck dissection and flap reconstruction, procedure performed by Dr. Fredricka Bonine at [facility] on 10/19/2013. She was seen by Dr. Enrique Sack at his office on 01/21/2014 for pre-radiation therapy dental protocol examination. Various treatment options were discussed as she opted to proceed with extraction of remaining teeth with alveoloplasty and pre-prostatic surgery. She presented to [facility] long hospital today, taken to the OR  where she underwent multiple extractions of tooth numbers 6, 7, 8, 10, 11, 12, 15, 16, 22, 23, 25, 26, 27. She also went 4 quadrants of alveoloplasty. Procedure performed by Dr Lawana Chambers. Patient tolerated procedure well there were no immediate complications. Dr.Kulinsky requested that patient be monitored postoperatively for 24 hours and thus asked medicine to admit for overnight observation. Patient is seen postoperatively with packing in her mouth. She is somewhat sedated from her procedure as history was limited, although she was able to nod yes and no to a few of my questions.                                                                                                                       Review of Systems:  Constitutional:  No weight loss, night sweats, Fevers, chills, fatigue.  HEENT:  No headaches, Difficulty swallowing,Tooth/dental problems,Sore throat,  No sneezing, itching, ear ache, nasal congestion, post nasal drip,  Cardio-vascular:  No chest pain, Orthopnea, PND, swelling in lower extremities, anasarca, dizziness, palpitations  GI:  No heartburn, indigestion, abdominal pain, nausea,  vomiting, diarrhea, change in bowel habits, loss of appetite  Resp:  No shortness of breath with exertion or at rest. No excess mucus, no productive cough, No non-productive cough, No coughing up of blood.No change in color of mucus.No wheezing.No chest wall deformity  Skin:  no rash or lesions.  GU:  no dysuria, change in color of urine, no urgency or frequency. No flank pain.  Musculoskeletal:  No joint pain or swelling. No decreased range of motion. No back pain.  Psych:  No change in mood or affect. No depression or anxiety. No memory loss.   Past Medical History  Diagnosis Date  . Psychiatric disorder   . Depression   . Bipolar disorder   . Shortness of breath     with exertion  . Cancer 10-19-2013    mucosa cancer   Past Surgical History  Procedure Laterality Date  . Right buccal mucosa composite resection  10/19/2013    S/P right buccal mucosa composite resection with flap reconstruction and right modified neck dissection on 10/20/2039 by Dr. Fredricka Bonine at [place] History:  reports that she has been smoking Cigarettes.  She has a 24.5 pack-year smoking  history. She has never used smokeless tobacco. She reports that she drinks about 1.2 ounces of alcohol per week. She reports that she does not use illicit drugs.  Allergies  Allergen Reactions  . Other Itching    Rash and itching with pepper    Family History  Problem Relation Age of Onset  . Cancer Mother   . Hypertension Father      Prior to Admission medications   Medication Sig Start Date End Date Taking? Authorizing Provider  haloperidol decanoate (HALDOL DECANOATE) 50 MG/ML injection Inject 200 mg into the muscle every 30 (thirty) days. Last dose 01/26/14    Historical Provider, MD   Physical Exam: Filed Vitals:   02/01/14 1337  BP: 172/77  Pulse: 55  Temp:   Resp: 16    BP 172/77  Pulse 55  Temp(Src) 98.2 F (36.8 C) (Oral)  Resp 16  Ht  (1.778 m)  Wt 96.616 kg  (213 lb)  BMI 30.56 kg/m2  SpO2 100%  General:  Appears calm and comfortable, sedated however arousable. Eyes: PERRL, normal lids, irises & conjunctiva ENT: Packing in place to mouth, for which I cannot examine oral mucosa Neck: no LAD, masses or thyromegaly Cardiovascular: RRR, no m/r/g. No LE edema. Telemetry: SR, no arrhythmias  Respiratory: CTA bilaterally, no w/r/r. Normal respiratory effort. Abdomen: soft, ntnd Skin: no rash or induration seen on limited exam Musculoskeletal: grossly normal tone BUE/BLE Psychiatric: grossly normal mood and affect, speech fluent and appropriate Neurologic: grossly non-focal.          Labs on Admission:  Basic Metabolic Panel: No results found for this basename: NA, K, CL, CO2, GLUCOSE, BUN, CREATININE, CALCIUM, MG, PHOS,  in the last 168 hours Liver Function Tests: No results found for this basename: AST, ALT, ALKPHOS, BILITOT, PROT, ALBUMIN,  in the last 168 hours No results found for this basename: LIPASE, AMYLASE,  in the last 168 hours No results found for this basename: AMMONIA,  in the last 168 hours CBC:  Recent Labs Lab 01/31/14 0915  WBC 8.1  HGB 13.5  HCT 39.6  MCV 101.3*  PLT 199   Cardiac Enzymes: No results found for this basename: CKTOTAL, CKMB, CKMBINDEX, TROPONINI,  in the last 168 hours  BNP (last 3 results) No results found for this basename: PROBNP,  in the last 8760 hours CBG: No results found for this basename: GLUCAP,  in the last 168 hours  Radiological Exams on Admission: Dg Chest 2 View  01/31/2014   CLINICAL DATA:  Pre op multiple dental extractions and rep prosthetic surgery  EXAM: CHEST  2 VIEW  COMPARISON:  DG CHEST 1V PORT dated 10/26/2005  FINDINGS: The heart size and mediastinal contours are within normal limits. Both lungs are clear. The visualized skeletal structures are unremarkable.  IMPRESSION: No active cardiopulmonary disease.   Electronically Signed   By: Salome Holmes M.D.   On: 01/31/2014  09:58    EKG: Independently reviewed.   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 Polymorphous adenocarcinoma of right buccal mucosa. Patient undergoing composite resection with right modified neck dissection and flap reconstruction at [facility] November of 2014. Plan to initiate radiation therapy. Prior to planned radiation she presented today for extraction of remaining teeth with alveoloplasty and pre-prostatic surgery. This procedure was performed by Dr.Kulinsky. Tolerated procedure well no immediate complications. This was done in order  to decrease risks and complications associated with dental infection and prevention of possible future complications such as osteoradionecrosis. Plan to monitor patient overnight for 23 hour observation. Place patient on clears, provide as needed narcotic analgesia for pain, supportive care, maintenance IV fluids. Anticipate discharge in a.m. if remains stable. 2. Hypertension. Patient systolic blood pressures elevated in the 170s. Will start Norvasc 5 mg by mouth daily. 3. DVT prophylaxis. Bilateral extremity SCDs    Code Status: Full Code Family Communication: Family not present Disposition Plan: Do not anticipate patient requiring greater than 2 night hospitalization, placed in a 24-hour obs  Time spent: 55 min  Kelvin Cellar [facility] Pager [phone]

## 2014-02-01 NOTE — Progress Notes (Signed)
Pt continuously pulling out oral packing after being replaced; Dr. Enrique Sack in, [place] to leave packing out as long as bleeding is minimal; pt also will not keep ice packs on to face

## 2014-02-01 NOTE — Op Note (Signed)
Patient:            Lorraine Tapia Date of Birth:  1951-10-05 MRN:                [id number]   DATE OF PROCEDURE:  02/01/2014               OPERATIVE REPORT   PREOPERATIVE DIAGNOSES: 1.  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   Preradiation therapy dental protocol  3.   Chronic apical periodontitis 4.   Chronic periodontitis 5.   Dental caries 6.   Multiple loose teeth  POSTOPERATIVE DIAGNOSES: 1.  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   Preradiation therapy dental protocol  3.   Chronic apical periodontitis 4.   Chronic periodontitis 5.   Dental caries 6.   Multiple loose teeth  OPERATIONS: 1. Multiple extraction of tooth numbers 6, 7, 8, 10, 11, 12, 15, 16, 22, 23, 25, 26, 27 2. 4 Quadrants of alveoloplasty   SURGEON: Lenn Cal, DDS  ASSISTANT: Camie Patience, (dental assistant)  ANESTHESIA: General anesthesia via nasoendotracheal tube.  MEDICATIONS: 1. Ancef 2 g IV prior to invasive dental procedures. 2. Local anesthesia with a total utilization of 4 carpules each containing 34 mg of lidocaine with 0.017 mg of epinephrine as well as 2 carpules each containing 9 mg of bupivacaine with 0.009 mg of epinephrine.  SPECIMENS: There are 13 teeth that were discarded.  DRAINS: None  CULTURES: None  COMPLICATIONS: None   ESTIMATED BLOOD LOSS: 100 mLs.  INTRAVENOUS FLUIDS: 1700 mLs of Lactated ringers solution.  INDICATIONS: The patient was recently diagnosed with adenocarcinoma of the buccal mucosa and had a surgical resection and reconstruction at [facility].  A medically necessary dental consultation was then requested prior to radiation therapy.  The patient was examined and treatment planned for extraction of remaining teeth with alveoloplasty and pre-prosthetic surgery as indicated in the operating room with general anesthesia.  This treatment plan was formulated to  decrease the risks and complications associated with dental infection from affecting the patient's systemic health and and to prevent future complications such as osteoradionecrosis.  OPERATIVE FINDINGS: Patient was examined operating room number 11.  The teeth were identified for extraction. The patient was noted be affected by chronic periodontitis, chronic apical periodontitis, multiple loose teeth, dental caries.   DESCRIPTION OF PROCEDURE: Patient was brought to the main operating room number 11. Patient was then placed in the supine position on the operating table. General anesthesia was then induced per the anesthesia team. The patient was then prepped and draped in the usual manner for dental medicine procedure. A timeout was performed. The patient was identified and procedures were verified. A throat pack was placed at this time. The oral cavity was then thoroughly examined with the findings noted above. The patient was then ready for dental medicine procedure as follows:  Local anesthesia was then administered sequentially with a total utilization of 4 carpules each containing 34 mg of lidocaine with 0.017 mg of epinephrine as well as 2 carpules  each containing 9 mg bupivacaine with 0.009 mg of epinephrine.  The Maxillary left and right quadrants first approached. Anesthesia was then delivered utilizing infiltration with lidocaine with epinephrine. A #15 blade incision was then made from the maxillary left tuberosity and extended to the mesial of #5.  A  surgical flap was then carefully reflected. Appropriate amounts of buccal and interseptal bone were then removed utilizing  a surgical handpiece and bur and copious amounts of sterile water around tooth numbers 15 and 16.  The teeth were then subluxated with a series of straight elevators. Tooth numbers 6, 7, 8, 10, 11, 12, 15, 16 were then removed with a 150 forceps without complications. Alveoloplasty was then performed utilizing a ronguers  and bone file. The surgical site was then irrigated with copious amounts of sterile saline. The tissues were approximated and trimmed appropriately. The surgical site was then closed from the maxillary left tuberosity and extended to the mesial of #9 utilizing 3-0 chromic gut suture in a continuous interrupted suture technique x1. The maxillary right surgical site was then closed from the distal of #5 and extended the mesial #8 utilizing 3-0 chromic gut suture in a continuous interrupted suture technique x1. One interrupted suture was then placed to further close the surgical site as needed.    At this point time, the mandibular quadrants were approached. The patient was given an inferior alveolar nerve blocks and long buccal nerve block on the left side and a mental nerve block on the right side utilizing the bupivacaine with epinephrine. Further infiltration was then achieved utilizing the lidocaine with epinephrine. A 15 blade incision was then made from the distal of number 20 and extended to the distal of #28.  A surgical flap was then carefully reflected. The lower teeth were then subluxated with a series of straight elevators. Tooth numbers 22, 23, 25, 26, and 27 were then removed with a 151 forceps without complications. Alveoloplasty was then performed utilizing a rongeurs and bone file. The tissues were approximated and trimmed appropriately. The surgical sites were then irrigated with copious amounts of sterile saline. The mandibular left surgical site was then closed from the distal of 20 and extended to the mesial of #24 utilizing 3-0 chromic gut suture in a continuous interrupted suture technique x1.  The mandibular right surgical site was then closed from the distal of #28 and extended to the mesial of #25 utilizing 3-0 chromic gut suture in a continuous interrupted suture technique x1. 3 individual interrupted sutures are then placed to further closed surgical site as needed.   At this point  time, the entire mouth was irrigated with copious amounts of sterile saline. The patient was examined for complications, seeing none, the dental medicine procedure was deemed to be complete. The throat pack was removed at this time. A series of 4 x 4 gauze were placed in the mouth to aid hemostasis. The patient was then handed over to the anesthesia team for final disposition. After an appropriate amount of time, the patient was extubated and taken to the postanesthsia care unit with stable vital signs and a good condition. All counts were correct for the dental medicine procedure. The patient will be kept overnight for observation and then discharged in the morning as indicated by the hospitalist team. Patient will return to clinic in approximately 7-10 days for evaluation for suture removal.  Patient will be given Ultram 50 mg for pain medication or other alternate pain medication as deemed acceptable by the hospitalist team.  Lenn Cal, DDS.

## 2014-02-01 NOTE — H&P (Signed)
02/01/2014  Patient:            Lorraine Tapia Date of Birth:  10/09/1951 MRN:                [id number]  The patient denies acute medical or dental changes. Please see note from Dr. Lisbeth Renshaw to use for the H&P required for dental operating room procedures. Dr. 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 female who is seen for an initial consultation visit. The patient indicates that she had a 9 in the right cheek region for quite a long time. She is unsure of the duration but indicates that this was present for years. She noted slow growth of this tumor but denies other symptoms related to this. No pain. The patient indicates that friends told her that she needed to get this checked out and therefore she sought medical attention for this.   This represented a firm mass within the right cheek which was anterior to the parotid gland. A separate mass was also noted in the right submandibular triangle region. An FNA of the right submandibular mass was performed as well as a biopsy of the right cheek mass through the buccal mucosa. Pathology returned positive for polymorphous low-grade adenocarcinoma.  A CT scan of the neck was performed. This demonstrated a mass in the region of the right buccal region measuring 5.0 x 3.5 x 5.7 cm. A mass lateral to the right submandibular gland was also present measuring 4.1 x 2.9  x 3.9 cm. An additional single enlarged level I lymph node was present anterior to the submandibular lesion measuring 14 mm.   The patient was seen at [facility] and proceeded with resection on 10/19/2013. This involved a composite resection of the right buccal space tumor as well as a right modified radical neck dissection including levels 1, 2, and 3. Final pathology returned positive for polymorphous low-grade adenocarcinoma. The primary mass measured 5.5 cm in maximum dimension. The tumor was positive for perineural invasion and lymphovascular invasion. The margins were negative for malignancy. The evaluation of the lymph nodes revealed 1 positive lymph node out of a total of 25. This lymph node measured 5.1 cm and was positive for extracapsular extension. Pathologically this represented a T3N2a tumor.   The patient indicates that she has done well postoperatively. She denies any significant ongoing pain. No difficulty swallowing. The incision sites are well healed. [name] for consideration of possible postoperative radiation treatment.     PREVIOUS RADIATION THERAPY: No     PAST MEDICAL HISTORY:  has a past medical history of Psychiatric disorder and  Depression.        PAST SURGICAL HISTORY:History reviewed. No pertinent past surgical history.     FAMILY HISTORY: family history is not on file.     SOCIAL HISTORY:  reports that she has been smoking Cigarettes.  She has a 24.5 pack-year smoking history. She has never used smokeless tobacco. She reports that she drinks about 1.2 ounces of alcohol per week. She reports that she does not use illicit drugs.     ALLERGIES: Review of patient's allergies indicates no known allergies.     MEDICATIONS:  Current Outpatient Prescriptions   Medication  Sig  Dispense  Refill   .  benztropine (COGENTIN) 1 MG tablet  Take 1 mg by mouth as needed for tremors.         Marland Kitchen   UNABLE TO FIND  Inject 200 mg as directed every 30 (thirty) days. Med Name: Haldol inject IM         .  haloperidol lactate (HALDOL) 5 MG/ML injection  *patient claims she gets a haloperidol injection once a month and that she gets 200mg  dose injection. Can't remember where she gets it or what pharmacy she goes to*         .  traMADol (ULTRAM) 50 MG tablet  Take 1 tablet (50 mg total) by mouth every 6 (six) hours as needed for pain.   15 tablet   0       No current facility-administered medications for this encounter.          REVIEW OF SYSTEMS:  A 15 point review of systems is documented in the electronic medical record. This was obtained by the nursing staff. However, I reviewed this with the patient to discuss relevant findings and make appropriate changes.  Pertinent items are noted in HPI.      PHYSICAL EXAM:  weight is 209 lb (94.802 kg). Her temperature is 98 F (36.7 C). Her blood pressure is 152/91 and her pulse is 76. Her oxygen saturation is 99%.   ECOG = 1   0 - Asymptomatic (Fully active, able to carry on all predisease activities without restriction)   1 - Symptomatic but completely ambulatory (Restricted in physically strenuous activity but ambulatory and able to carry out work of a light or sedentary nature. For example, light housework, office work)   2 - Symptomatic, <50% in bed during the day (Ambulatory and capable of all self care but unable to carry out any work activities. Up and about more than 50% of waking hours)   3 - Symptomatic, >50% in bed, but not bedbound (Capable of only limited self-care, confined to bed or chair 50% or more of waking hours)   4 - Bedbound (Completely disabled. Cannot carry on any self-care. Totally confined to bed or chair)   5 - Death    Eustace Pen MM, Creech RH, Tormey DC, et al. [phone]). "Toxicity and response criteria of the [facility]". [place] Oncol. 5 (6): 649-55   General: Well-developed, in  no acute distress HEENT: oral cavity clear status post resection/reconstruction of the right buccal mucosa. No sign of recurrence. Facial movements shows some drooping on the right. Neck: Supple without any lymphadenopathy; well-healed surgical incision present in the upper right neck. No nodularity. No cervical adenopathy present. Cardiovascular: Regular rate and rhythm Respiratory: Clear to auscultation bilaterally GI: Soft, nontender, normal bowel sounds Extremities: No edema present           LABORATORY DATA:  Lab Results   Component  Value  Date     WBC  8.6  09/01/2013     HGB  13.7  09/01/2013     HCT  39.5  09/01/2013     MCV  99.0  09/01/2013     PLT  226  09/01/2013       Lab Results   Component  Value  Date     NA  137  09/01/2013     K  4.4  09/01/2013     CL  106  09/01/2013     CO2  23  09/01/2013       Lab Results   Component  Value  Date     ALT  10  09/01/2013     AST  15  09/01/2013     ALKPHOS  67  09/01/2013     BILITOT  0.4  09/01/2013           RADIOGRAPHY: No results found.      IMPRESSION: The patient is a pleasant 63 year old female who is status post resection of a right buccal tumor which returned positive for a polymorphous low-grade adenocarcinoma. This represented a T3N2aM0 tumor with a large involved lymph node with extracapsular extension within the right neck. The patient is at increased risk for local/regional failure given the specifics of her case. I discussed with her my recommendation for her to proceed with adjuvant radiation treatment.   I discussed with the patient the rationale/potential benefit from such a treatment. We also discussed the potential side effects and risks of treatment as well. All of her questions were answered. We also discussed further appointments which we'll be scheduled prior to beginning radiation treatment, including dental evaluation and being seen by speech therapy. I believe holding off on placement of a  feeding tube would be appropriate for her case. This issue can be revisited if necessary as she proceeds with treatment.         PLAN: The patient will be scheduled for a simulation such that we can proceed with treatment planning. Dental valuation and speech therapy referrals are pending.  I anticipate treating the patient to a dose of 66 gray in 33 fractions. The target volume will include the postoperative site and high-risk right-sided lymph node regions.     I spent 60 minutes face to face with the patient and more than 50% of that time was spent in counseling and/or coordination of care.     ________________________________    Jodelle Gross, MD, PhD

## 2014-02-01 NOTE — Telephone Encounter (Signed)
Spoke with [place] (?) with [facility] to inform them of patient's 3/6 12:00 nutritional consult appt.  She verbalized understanding.  I called her Case Manager Archer Asa and informed him also; he verbalized understanding.  Gayleen Orem, RN, BSN, [facility] On[facility] Navigator [phone]

## 2014-02-02 ENCOUNTER — Encounter (HOSPITAL_COMMUNITY): Payer: Self-pay | Admitting: Dentistry

## 2014-02-02 DIAGNOSIS — C069 Malignant neoplasm of mouth, unspecified: Secondary | ICD-10-CM

## 2014-02-02 DIAGNOSIS — K0530 Chronic periodontitis, unspecified: Secondary | ICD-10-CM

## 2014-02-02 DIAGNOSIS — K1370 Unspecified lesions of oral mucosa: Secondary | ICD-10-CM

## 2014-02-02 DIAGNOSIS — I10 Essential (primary) hypertension: Secondary | ICD-10-CM

## 2014-02-02 DIAGNOSIS — K08109 Complete loss of teeth, unspecified cause, unspecified class: Secondary | ICD-10-CM

## 2014-02-02 LAB — CBC
HEMATOCRIT: 33.5 % — AB (ref 36.0–46.0)
Hemoglobin: 11.4 g/dL — ABNORMAL LOW (ref 12.0–15.0)
MCH: 34.1 pg — ABNORMAL HIGH (ref 26.0–34.0)
MCHC: 34 g/dL (ref 30.0–36.0)
MCV: 100.3 fL — ABNORMAL HIGH (ref 78.0–100.0)
Platelets: 180 10*3/uL (ref 150–400)
RBC: 3.34 MIL/uL — AB (ref 3.87–5.11)
RDW: 13.7 % (ref 11.5–15.5)
WBC: 11.2 10*3/uL — AB (ref 4.0–10.5)

## 2014-02-02 LAB — BASIC METABOLIC PANEL
BUN: 11 mg/dL (ref 6–23)
CHLORIDE: 104 meq/L (ref 96–112)
CO2: 24 meq/L (ref 19–32)
CREATININE: 0.62 mg/dL (ref 0.50–1.10)
Calcium: 8.7 mg/dL (ref 8.4–10.5)
GFR calc Af Amer: >90 mL/min (ref >90)
GFR calc non Af Amer: >90 mL/min (ref >90)
Glucose, Bld: 92 mg/dL (ref 70–99)
POTASSIUM: 4.3 meq/L (ref 3.7–5.3)
SODIUM: 138 meq/L (ref 137–147)

## 2014-02-02 MED ORDER — CHLORHEXIDINE GLUCONATE 0.12 % MT SOLN: 15.0000 mL | Freq: Two times a day (BID) | OROMUCOSAL | Status: DC

## 2014-02-02 MED ORDER — BIOTENE DRY MOUTH MT LIQD: 15.0000 mL | Freq: Two times a day (BID) | OROMUCOSAL | Status: DC

## 2014-02-02 MED ORDER — AMLODIPINE BESYLATE 5 MG PO TABS: 5.0000 mg | ORAL_TABLET | Freq: Every day | ORAL | Status: DC

## 2014-02-02 MED ORDER — OXYCODONE HCL 5 MG PO TABS: 5.0000 mg | ORAL_TABLET | ORAL | Status: DC | PRN

## 2014-02-02 NOTE — Progress Notes (Signed)
POST OPERATIVE NOTE:  02/02/2014 Lorraine Tapia [id number]  VITALS: BP 131/55  Pulse 58  Temp(Src) 98.6 F (37 C) (Oral)  Resp 16  Ht 5\' 10"  (1.778 m)  Wt 213 lb (96.616 kg)  BMI 30.56 kg/m2  SpO2 100%  LABS:  Lab Results  Component Value Date   WBC 11.2* 02/02/2014   HGB 11.4* 02/02/2014   HCT 33.5* 02/02/2014   MCV 100.3* 02/02/2014   PLT 180 02/02/2014   BMET    Component Value Date/Time   NA 138 02/02/2014 0325   K 4.3 02/02/2014 0325   CL 104 02/02/2014 0325   CO2 24 02/02/2014 0325   GLUCOSE 92 02/02/2014 0325   BUN 11 02/02/2014 0325   CREATININE 0.62 02/02/2014 0325   CREATININE 0.67 09/01/2013 1257   CALCIUM 8.7 02/02/2014 0325   GFRNONAA >90 02/02/2014 0325   GFRAA >90 02/02/2014 0325    No results found for this basename: INR, PROTIME   No results found for this basename: PTT     Lorraine Tapia is status post multiple extraction of remaining teeth with alveoloplasty in the operating room with general anesthesia on 02/01/2014.  SUBJECTIVE: Patient with minimal oral discomfort from the dental extractions. Patient denies having any bleeding from the extraction sites.   EXAM: There is no sign of infection, heme, or ooze. Sutures are intact. Patient has generalized primary closure. Minimal ecchymoses and intraoral swelling is noted.  ASSESSMENT: Post operative course is consistent with dental procedures performed in the operating room. The patient is now edentulous.  PLAN: 1. Patient is to start saltwater rinses every 2 hours while awake to aid healing. 2. Patient was reminded to brush her tongue daily. 3. Discussed advancing her diet to soft mechanical diet to include scrambled eggs, grits, applesauce, yogurt, Greek yogurt, mashed pototoes , soups, and ensure plus or boost nutritional supplements. Patient was reminded to keep her nutritional appointment with Lorraine Tapia for this coming Friday. 4. RTC for suture removal in 7- 10 days. 5. Patient is acceptable for  discharge from a dental standpoint.  Lenn Cal, DDS

## 2014-02-02 NOTE — Discharge Instructions (Signed)

## 2014-02-02 NOTE — Discharge Summary (Signed)
Physician Discharge Summary  Lorraine Tapia EXB:[id number] DOB: 1951-05-26 DOA: 02/01/2014  PCP: Angelica Chessman, MD  Admit date: 02/01/2014 Discharge date: 02/02/2014  Recommendations for Outpatient Follow-up:  1. Patient is to start saltwater rinses every 2 hours while awake to aid healing. 2. Patient was reminded to brush her tongue daily. 3. Discussed advancing her diet to soft mechanical diet to include scrambled eggs, grits, applesauce, yogurt, Greek yogurt, mashed pototoes , soups, and ensure plus or boost nutritional supplements. Patient was reminded to keep her nutritional appointment with [name] for this coming Friday. 4. Return to clinic for suture removal in 7- 10 days. 5. 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 female with a past medical history of polymorphous adenocarcinoma of right buccal mucosa, status post resection with right modified neck dissection and flap reconstruction, procedure performed by Dr. Fredricka Bonine at [facility] on 10/19/2013. She was seen by Dr. Enrique Sack at his office on 01/21/2014 for pre-radiation therapy dental protocol examination. Various treatment options were discussed as she opted to proceed with extraction of remaining teeth with alveoloplasty and pre-prostatic surgery. She presented to [facility] long hospital, taken to the [place] where she underwent multiple extractions of teeth. She also went for quadrants of alveoloplasty. Procedure performed by Dr Lawana Chambers. Patient tolerated procedure well and there were no immediate complications. Dr.Kulinsky requested that patient be monitored postoperatively for 24  hours and thus asked medicine to admit for overnight observation. Patient is seen postoperatively with packing in her mouth.   Hospital Course:   Active Problems: Polymorphous adenocarcinoma of right buccal mucosa.  - Patient is status post resection with right modified neck dissection and flap reconstruction at [facility] November of 2014. Plan to initiate radiation therapy. Prior to planned radiation she presented for extraction of remaining teeth with alveoloplasty and pre-prostatic surgery. This procedure was performed by Dr.Kulinsky. Tolerated procedure well no immediate complications. This was done in order to decrease risks and complications associated with dental infection and prevention of possible future complications such as osteoradionecrosis.  - stable from dental standpoint to go home with recommendations on diet indicated in follow up section Hypertension - started Norvasc and told pt will start this new medication for BP control   Signed:  Leisa Lenz, MD  [facility] 02/02/2014, 12:35 PM  Pager #: [phone]  Procedures:  Teeth extraction   Consultations:  Dental   Discharge Exam: Filed Vitals:   02/02/14 1002  BP: 146/71  Pulse:   Temp:   Resp:    Filed Vitals:   02/01/14 2012 02/02/14 0550 02/02/14 0948 02/02/14 1002  BP: 149/75 131/55 165/92 146/71  Pulse: 71 58 62   Temp: 98.3 F (36.8 C) 98.6 F (37 C) 98.4 F (36.9 C)   TempSrc: Oral Oral Oral   Resp: 16 16 16    Height:      Weight:      SpO2: 96% 100% 100%     General: Pt is alert, follows commands appropriately, not in acute distress Cardiovascular: Regular rate and rhythm, S1/S2 +, no murmurs, no rubs, no gallops Respiratory: Clear to auscultation bilaterally, no wheezing, no crackles, no rhonchi Abdominal: Soft, non tender,  non distended, bowel sounds +, no guarding Extremities: no edema, no cyanosis, pulses palpable bilaterally DP and PT Neuro: Grossly  nonfocal  Discharge Instructions  Discharge Orders   Future Appointments Provider Department Dept Phone   02/04/2014 12:00 PM [facility] [phone]   02/09/2014 10:00 AM Lenn Cal, [facility] [phone]   02/09/2014 11:00 AM Marye Round, [facility] [phone]   Joint Appt [facility] 2 [place] [facility] [phone]   Future Orders Complete By Expires   Call MD for:  difficulty breathing, headache or visual disturbances  As directed    Call MD for:  persistant dizziness or light-headedness  As directed    Call MD for:  persistant nausea and vomiting  As directed    Call MD for:  severe uncontrolled pain  As directed    Consult to dietitian  As directed    Scheduling Instructions:     Patient is edentulous.   Diet - low sodium heart healthy  As directed    Discharge instructions  As directed    Comments:     1. Patient is to start saltwater rinses every 2 hours while awake to aid healing. 2. Patient was reminded to brush her tongue daily. 3. Discussed advancing her diet to soft mechanical diet to include scrambled eggs, grits, applesauce, yogurt, Greek yogurt, mashed pototoes , soups, and ensure plus or boost nutritional supplements. Patient was reminded to keep her nutritional appointment with [name] for this coming Friday. 4. Return to clinic for suture removal in 7- 10 days. 5. Please note Norvasc 5 mg daily for blood pressure control   Gauze  As directed    Comments:     4 x 4 gauze to oral bleeding sites until oozing stops.   Increase activity slowly  As directed        Medication List         amLODipine 5 MG tablet  Commonly known as:  NORVASC  Take 1 tablet (5 mg total) by mouth daily.     antiseptic oral rinse Liqd  15 mLs by Mouth Rinse route 2 times daily at 12 noon and 4 pm.      chlorhexidine 0.12 % solution  Commonly known as:  PERIDEX  15 mLs by Mouth Rinse route 2 (two) times daily.     haloperidol decanoate 50 MG/ML injection  Commonly known as:  HALDOL DECANOATE  Inject 200 mg into the muscle every 30 (thirty) days. Last dose 01/26/14     oxyCODONE 5 MG immediate release tablet  Commonly known as:  Oxy IR/ROXICODONE  Take 1 tablet (5 mg total) by mouth every 4 (four) hours as needed for moderate pain.           Follow-up Information   Follow up with Lenn Cal, DDS. Schedule an appointment as soon as possible for a visit on 02/10/2014. (For suture removal)    Specialty:  Dentistry   Contact information:   [place] [address] [phone]        The results of significant diagnostics from this hospitalization (including imaging, microbiology, ancillary and laboratory) are listed below for reference.    Significant Diagnostic Studies: Dg Chest 2 View  01/31/2014   CLINICAL DATA:  Pre op multiple dental extractions and rep prosthetic surgery  EXAM: CHEST  2 VIEW  COMPARISON:  DG CHEST 1V PORT dated 10/26/2005  FINDINGS: The heart size and mediastinal contours are within normal limits. Both lungs are clear. The visualized skeletal structures are unremarkable.  IMPRESSION: No active cardiopulmonary disease.   Electronically Signed   By: Margaree Mackintosh M.D.   On: 01/31/2014 09:58    Microbiology: No results found for this or any previous visit (from the past 240 hour(s)).   Labs: Basic Metabolic Panel:  Recent Labs Lab 02/02/14 0325  NA 138  K 4.3  CL 104  CO2 24  GLUCOSE 92  BUN 11  CREATININE 0.62  CALCIUM 8.7   Liver Function Tests: No results found for this basename: AST, ALT, ALKPHOS, BILITOT, PROT, ALBUMIN,  in the last 168 hours No results found for this basename: LIPASE, AMYLASE,  in the last 168 hours No results found for this basename: AMMONIA,  in the last 168 hours CBC:  Recent Labs Lab  01/31/14 0915 02/02/14 0325  WBC 8.1 11.2*  HGB 13.5 11.4*  HCT 39.6 33.5*  MCV 101.3* 100.3*  PLT 199 180   Cardiac Enzymes: No results found for this basename: CKTOTAL, CKMB, CKMBINDEX, TROPONINI,  in the last 168 hours BNP: BNP (last 3 results) No results found for this basename: PROBNP,  in the last 8760 hours CBG: No results found for this basename: GLUCAP,  in the last 168 hours  Time coordinating discharge: Over 30 minutes

## 2014-02-02 NOTE — Progress Notes (Signed)
Patient d/c'd to [facility].Ambulatory. Stable.Accompanied by our secretary to the front lobby.Sandie Ano RN

## 2014-02-02 NOTE — Progress Notes (Signed)
Patient d/c instructions given with teach back, verbalized understanding.Prescriptions handed to patient.Stable.Denies pain,no s/s of bleeding. Cooperative.Sandie Ano RN

## 2014-02-02 NOTE — Progress Notes (Addendum)
CSW received notification from RN that pt was admitted from [place].  CSW spoke to pt at bedside, but pt had gauze in her mouth following surgery. Pt was able to indicate that she did not want CSW to contact pt family, but CSW could contact Serafina Mitchell with [facility].  CSW spoke with Serafina Mitchell with [facility] (ph #: [phone]) who clarified that pt is not a resident of [place] and a resident of a boarding house.   Per Serafina Mitchell with [facility], [facility] can provide transportation and to contact the [facility] office at [phone] to arrange transport.  CSW to assist with arranging transportation when pt medically ready for discharge.  Alison Murray, MSW, [place] [employer] [phone]

## 2014-02-02 NOTE — Progress Notes (Addendum)
CSW received notification from RN that pt for discharge today.  CSW discussed with RN who stated that 3:30 pm would be a good time for transportation through [facility] to pick up pt from hospital.  CSW contacted [facility] and arranged transport for 3:30 pm. Transportation will either come to pt room or call unit to notify staff that transportation has arrived.   CSW updated RN.  No further social work needs identified at this time.  CSW signing off.  Alison Murray, MSW, [place] [employer] [phone]

## 2014-02-04 ENCOUNTER — Encounter: Payer: Self-pay | Admitting: Nutrition

## 2014-02-08 NOTE — Progress Notes (Signed)
Patient did not cancel or show up for nutrition appointment.  This is the second time patient has missed her nutrition appointment.

## 2014-02-09 ENCOUNTER — Ambulatory Visit (HOSPITAL_COMMUNITY): Payer: Medicaid - Dental | Admitting: Dentistry

## 2014-02-09 ENCOUNTER — Encounter (HOSPITAL_COMMUNITY): Payer: Self-pay | Admitting: Dentistry

## 2014-02-09 ENCOUNTER — Ambulatory Visit
Admission: RE | Admit: 2014-02-09 | Discharge: 2014-02-09 | Disposition: A | Discharge status: Home / Self Care | Payer: Medicaid Other | Source: Ambulatory Visit | Attending: Radiation Oncology | Admitting: Radiation Oncology

## 2014-02-09 ENCOUNTER — Encounter: Payer: Self-pay | Admitting: Nutrition

## 2014-02-09 VITALS — BP 156/84 | HR 65 | Temp 98.4°F

## 2014-02-09 DIAGNOSIS — Y842 Radiological procedure and radiotherapy as the cause of abnormal reaction of the patient, or of later complication, without mention of misadventure at the time of the procedure: Secondary | ICD-10-CM | POA: Insufficient documentation

## 2014-02-09 DIAGNOSIS — C069 Malignant neoplasm of mouth, unspecified: Secondary | ICD-10-CM

## 2014-02-09 DIAGNOSIS — R221 Localized swelling, mass and lump, neck: Secondary | ICD-10-CM

## 2014-02-09 DIAGNOSIS — K08109 Complete loss of teeth, unspecified cause, unspecified class: Secondary | ICD-10-CM

## 2014-02-09 DIAGNOSIS — L988 Other specified disorders of the skin and subcutaneous tissue: Secondary | ICD-10-CM | POA: Insufficient documentation

## 2014-02-09 DIAGNOSIS — Z0189 Encounter for other specified special examinations: Secondary | ICD-10-CM

## 2014-02-09 DIAGNOSIS — Z79899 Other long term (current) drug therapy: Secondary | ICD-10-CM | POA: Insufficient documentation

## 2014-02-09 DIAGNOSIS — K000 Anodontia: Secondary | ICD-10-CM

## 2014-02-09 DIAGNOSIS — K117 Disturbances of salivary secretion: Secondary | ICD-10-CM | POA: Insufficient documentation

## 2014-02-09 DIAGNOSIS — K08199 Complete loss of teeth due to other specified cause, unspecified class: Secondary | ICD-10-CM

## 2014-02-09 DIAGNOSIS — K121 Other forms of stomatitis: Secondary | ICD-10-CM | POA: Insufficient documentation

## 2014-02-09 DIAGNOSIS — R220 Localized swelling, mass and lump, head: Secondary | ICD-10-CM | POA: Insufficient documentation

## 2014-02-09 DIAGNOSIS — C060 Malignant neoplasm of cheek mucosa: Secondary | ICD-10-CM

## 2014-02-09 DIAGNOSIS — Z510 Encounter for antineoplastic radiation therapy: Secondary | ICD-10-CM | POA: Insufficient documentation

## 2014-02-09 DIAGNOSIS — K1230 Oral mucositis (ulcerative), unspecified: Secondary | ICD-10-CM

## 2014-02-09 MED ORDER — OXYCODONE HCL 5 MG PO TABS: 5.0000 mg | ORAL_TABLET | ORAL | Status: DC | PRN

## 2014-02-09 NOTE — Progress Notes (Signed)
POST OPERATIVE NOTE:  02/09/2014 Lorraine Tapia [id number]  VITALS: BP 156/84  Pulse 65  Temp(Src) 98.4 F (36.9 C) (Oral)  LABS:  Lab Results  Component Value Date   WBC 11.2* 02/02/2014   HGB 11.4* 02/02/2014   HCT 33.5* 02/02/2014   MCV 100.3* 02/02/2014   PLT 180 02/02/2014   BMET    Component Value Date/Time   NA 138 02/02/2014 0325   K 4.3 02/02/2014 0325   CL 104 02/02/2014 0325   CO2 24 02/02/2014 0325   GLUCOSE 92 02/02/2014 0325   BUN 11 02/02/2014 0325   CREATININE 0.62 02/02/2014 0325   CREATININE 0.67 09/01/2013 1257   CALCIUM 8.7 02/02/2014 0325   GFRNONAA >90 02/02/2014 0325   GFRAA >90 02/02/2014 0325    No results found for this basename: INR, PROTIME   No results found for this basename: PTT     Lorraine Tapia  is status post extraction of remaining teeth with alveoloplasty in the operating room on 02/01/2014. The patient now presents for periodic oral examination and evaluation of healing and suture removal as needed.   SUBJECTIVE: Patient currently is complaining of discomfort involving her gums from the extraction sites. Patient has requested an additional pain medication prescription today. Patient has some stitches that remain. Patient indicates that she is using salt water rinses.   EXAM: There is no sign of infection, heme, or ooze. Sutures are loosely intact. Patient has generalized primary closure with some areas of healing in by secondary intention involving the upper left quadrant.  PROCEDURE: The patient was given a chlorhexidine gluconate rinse for 30 seconds. Sutures were then removed without complication. Patient tolerated the procedure well.  ASSESSMENT: Post operative course is consistent with dental procedures performed in the operating room. Patient is now edentulous.  PLAN: 1. Continue salt water rinses every 2 hours while awake to aid healing. 2. Maintained soft diet and advance as tolerated per nutritional consultation. 3. Use oxycodone IR  5 mg as needed for pain every 4-6 hours if needed. No refills. A new prescription was given to the patient today for 30 tablets. 4. The patient is now cleared for radiation therapy starting 02/16/2014. 5. Patient to return to clinic for periodic oral examination during radiation therapy in approximately 2-3 weeks to coincide with radiation therapy appointments. 6. Patient to followup with prosthodontist for evaluation for replacement of missing teeth approximately 3 months after the laceration therapy dose has been given.  Lenn Cal, DDS

## 2014-02-09 NOTE — Patient Instructions (Signed)
Use salt water rinses as needed to aid healing. Maintain soft diet and advance as tolerated. Dietary consult to discuss nutritional needs. Patient is cleared for the radiation therapy to start 02/16/2014. Return to clinic in 2-3 weeks for periodic oral examination during radiation therapy.  Dr. Enrique Sack

## 2014-02-15 NOTE — Addendum Note (Signed)
Encounter addended by: Marye Round, MD on: 02/15/2014  9:20 AM<BR>     Documentation filed: Notes Section

## 2014-02-21 ENCOUNTER — Encounter: Payer: Self-pay | Admitting: Nutrition

## 2014-02-21 ENCOUNTER — Ambulatory Visit: Payer: Medicaid Other

## 2014-02-21 ENCOUNTER — Ambulatory Visit
Admission: RE | Admit: 2014-02-21 | Discharge: 2014-02-21 | Disposition: A | Discharge status: Home / Self Care | Payer: Medicaid Other | Source: Ambulatory Visit | Attending: Radiation Oncology | Admitting: Radiation Oncology

## 2014-02-21 DIAGNOSIS — R220 Localized swelling, mass and lump, head: Principal | ICD-10-CM

## 2014-02-21 DIAGNOSIS — J3489 Other specified disorders of nose and nasal sinuses: Secondary | ICD-10-CM

## 2014-02-21 MED ORDER — BIAFINE EX EMUL
CUTANEOUS | Status: DC | PRN
  Administered 2014-02-21: 16:00:00 via TOPICAL

## 2014-02-21 NOTE — Progress Notes (Signed)
Patient education, radiation therapy and you book, biafine cream my business card given to patient , patient stated she doesn't read, discussed side effects, fatigue,pain, skin irritation, mouth changes, throat changes, taste changes, thick like saliva, to keep water on hand, nausea, will reiterate weekly and prn, pateint stated she will have her landlord call if she cannot make rad txs the next day for either illness, or emergenies,etc., , patient gave verbal understanding of seeing MD weekly and prn , and that she will let staff know for any abnormal side effects, cannot swallow, eating soft foods at presnent, may need to eat 5-6 smaller meals, later instead of 3 meals daily

## 2014-02-22 ENCOUNTER — Ambulatory Visit: Payer: Medicaid Other | Admitting: Nutrition

## 2014-02-22 ENCOUNTER — Ambulatory Visit
Admission: RE | Admit: 2014-02-22 | Discharge: 2014-02-22 | Disposition: A | Discharge status: Home / Self Care | Payer: Medicaid Other | Source: Ambulatory Visit | Attending: Radiation Oncology | Admitting: Radiation Oncology

## 2014-02-22 ENCOUNTER — Ambulatory Visit: Payer: Medicaid Other

## 2014-02-22 NOTE — Progress Notes (Signed)
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  Past medical history includes depression, psychological disorder, tobacco, and shortness of breath.  Medications include Haldol, Biotene, and Peridex.  Labs were reviewed.  Height 5 feet 11 inches. Weight: 213 pounds. Usual body weight: 218 pounds. BMI: 30.56.    Patient just began radiation treatment.  She reports she lives in a group home and cooks for herself.  Patient typically eats eggs and sausage for breakfast, sandwiches for lunch, and soup for dinner.  Patient denies nutrition side effects at this time.  She does report she does not like Ensure.  Nutrition diagnosis: Predicted suboptimal energy intake related to diagnosis of right buccal mucosa cancer as evidenced by history or presence of this condition for which research shows an increased incidence of suboptimal energy intake.  Intervention: Educated patient to add small, high-protein snacks between present meals.  Encouraged patient to continue utilizing high protein foods that she enjoys.  Provided examples of ways she could add protein to her diet.  Patient able to teach back appropriate snacks.  Educated patient that I would assist her throughout treatment.  She understands goal is weight maintenance.  Monitoring, evaluation, goals: Patient will tolerate adequate calories and protein to minimize weight loss throughout treatment.  Next visit: I will attempt followup throughout treatment.

## 2014-02-23 ENCOUNTER — Ambulatory Visit: Payer: Medicaid Other

## 2014-02-23 ENCOUNTER — Ambulatory Visit
Admission: RE | Admit: 2014-02-23 | Discharge: 2014-02-23 | Disposition: A | Discharge status: Home / Self Care | Payer: Medicaid Other | Source: Ambulatory Visit | Attending: Radiation Oncology | Admitting: Radiation Oncology

## 2014-02-24 ENCOUNTER — Ambulatory Visit: Payer: Medicaid Other

## 2014-02-24 ENCOUNTER — Ambulatory Visit
Admission: RE | Admit: 2014-02-24 | Discharge: 2014-02-24 | Disposition: A | Discharge status: Home / Self Care | Payer: Medicaid Other | Source: Ambulatory Visit | Attending: Radiation Oncology | Admitting: Radiation Oncology

## 2014-02-25 ENCOUNTER — Ambulatory Visit
Admission: RE | Admit: 2014-02-25 | Discharge: 2014-02-25 | Disposition: A | Discharge status: Home / Self Care | Payer: Medicaid Other | Source: Ambulatory Visit | Attending: Radiation Oncology | Admitting: Radiation Oncology

## 2014-02-25 ENCOUNTER — Encounter: Payer: Self-pay | Admitting: Radiation Oncology

## 2014-02-25 ENCOUNTER — Encounter: Payer: Self-pay | Admitting: *Deleted

## 2014-02-25 ENCOUNTER — Ambulatory Visit: Payer: Medicaid Other

## 2014-02-25 VITALS — BP 138/61 | HR 63 | Temp 98.8°F | Resp 20 | Wt 210.5 lb

## 2014-02-25 DIAGNOSIS — C069 Malignant neoplasm of mouth, unspecified: Secondary | ICD-10-CM

## 2014-02-25 NOTE — Progress Notes (Addendum)
Weekly rad txs 5/33 rt neck completed, no c/o pain, nausea, sob, 100% room air sats, using biafine daily after rad tx, no skin changes, had eggs, grits, sausage for breakffast, drinks water,still smokes cigarettes down to 10 daily, patient doesn't want to keep speech therapy appt 02/28/14, encouraged her to keep appt , "I'm eating fine",  12:02 PM

## 2014-02-25 NOTE — Progress Notes (Signed)
  Radiation Oncology         [phone] ________________________________  Name: Lorraine Tapia MRN: [id number]  Date: 02/09/2014  DOB: February 26, 1951  SIMULATION AND TREATMENT PLANNING NOTE   CONSENT VERIFIED: yes   SET UP: Patient is set-up supine   IMMOBILIZATION: The following immobilization is used:Aquaplast Mask. This complex treatment device will be used on a daily basis during the patient's treatment.  Diagnosis:  Polymorphous low-grad adenocarcinoma   NARRATIVE: The patient was brought to the [facility].  Identity was confirmed.  All relevant records and images related to the planned course of therapy were reviewed.  Then, the patient was positioned in a stable reproducible clinical set-up for radiation therapy using an aquaplast mask.  Skin markings were placed.  The CT images were loaded into the planning software where the target and avoidance structures were contoured.The radiation prescription was entered and confirmed.  The patient will receive 66 Gy in 33 fractions.  Daily image guidance is ordered, and this will be used on a daily basis. This is necessary to ensure accurate and precise localization of the target in addition to accurate alignment of the normal tissue structures in this region.  Treatment planning then occurred.  I have requested : Intensity Modulated Radiotherapy (IMRT) is medically necessary for this case for the following reason:  Dose homogeneity and treatment of a head and neck site. The target is in close proximity to critical normal structures, as well as the parotid glands which may negatively impact the patient's quality of life if not maximally spared given the constraints of the overall treatment plan. IMRT is thus medically necessary to appropriately treat the patient.   ________________________________   Jodelle Gross, MD, PhD

## 2014-02-25 NOTE — Progress Notes (Signed)
   Department of Radiation Oncology  Phone:  [phone] Fax:        [phone]  Weekly Treatment Note    Name: [name] Date: 02/25/2014 MRN: [id number] DOB: December 02, 1951   Current dose: 10 Gy  Current fraction: 5   MEDICATIONS: Current Outpatient Prescriptions  Medication Sig Dispense Refill  . amLODipine (NORVASC) 5 MG tablet Take 1 tablet (5 mg total) by mouth daily.  30 tablet  0  . chlorhexidine (PERIDEX) 0.12 % solution 15 mLs by Mouth Rinse route 2 (two) times daily.  120 mL  0  . [START ON 02/28/2014] emollient (BIAFINE) cream Apply 1 application topically 2 (two) times daily. Apply to affected neck area after radiation and bedtime daily once skin becomes irritated/erythema      . haloperidol decanoate (HALDOL DECANOATE) 50 MG/ML injection Inject 200 mg into the muscle every 30 (thirty) days. Last dose 01/26/14      . oxyCODONE (OXY IR/ROXICODONE) 5 MG immediate release tablet Take 1 tablet (5 mg total) by mouth every 4 (four) hours as needed for severe pain.  30 tablet  0   No current facility-administered medications for this encounter.     ALLERGIES: Other   LABORATORY DATA:  Lab Results  Component Value Date   WBC 11.2* 02/02/2014   HGB 11.4* 02/02/2014   HCT 33.5* 02/02/2014   MCV 100.3* 02/02/2014   PLT 180 02/02/2014   Lab Results  Component Value Date   NA 138 02/02/2014   K 4.3 02/02/2014   CL 104 02/02/2014   CO2 24 02/02/2014   Lab Results  Component Value Date   ALT 10 09/01/2013   AST 15 09/01/2013   ALKPHOS 67 09/01/2013   BILITOT 0.4 09/01/2013     NARRATIVE: Lorraine Tapia was seen today for weekly treatment management. The chart was checked and the patient's films were reviewed. The patient is doing very well after her first week of treatment. No skin changes. She is swallowing well with good appetite.  PHYSICAL EXAMINATION: weight is 210 lb 8 oz (95.482 kg). Her oral temperature is 98.8 F (37.1 C). Her blood pressure is 138/61 and her  pulse is 63. Her respiration is 20.      no significant skin change currently.  ASSESSMENT: The patient is doing satisfactorily with treatment.  PLAN: We will continue with the patient's radiation treatment as planned.

## 2014-02-26 NOTE — Progress Notes (Signed)
Met briefly with patient and her caregiver in [facility] lobby s/p RT.  Pt stated "it's going OK".  I reminded her to call me with any questions/concerns.  She verbalized understanding.  Provided caregiver my business card; encouraged her to call me at any time.  She verbalized understanding.  Continuing navigation as L1 patient (new patient).  Gayleen Orem, RN, BSN, [facility] Head & Neck Oncology Navigator [phone]

## 2014-02-28 ENCOUNTER — Ambulatory Visit: Payer: Self-pay

## 2014-02-28 ENCOUNTER — Ambulatory Visit
Admission: RE | Admit: 2014-02-28 | Discharge: 2014-02-28 | Disposition: A | Discharge status: Home / Self Care | Payer: Medicaid Other | Source: Ambulatory Visit | Attending: Radiation Oncology | Admitting: Radiation Oncology

## 2014-02-28 ENCOUNTER — Ambulatory Visit: Payer: Medicaid Other

## 2014-02-28 ENCOUNTER — Ambulatory Visit: Payer: Medicaid Other | Attending: Radiation Oncology

## 2014-03-01 ENCOUNTER — Ambulatory Visit: Payer: Medicaid Other

## 2014-03-01 ENCOUNTER — Ambulatory Visit
Admission: RE | Admit: 2014-03-01 | Discharge: 2014-03-01 | Disposition: A | Discharge status: Home / Self Care | Payer: Medicaid Other | Source: Ambulatory Visit | Attending: Radiation Oncology | Admitting: Radiation Oncology

## 2014-03-02 ENCOUNTER — Ambulatory Visit (HOSPITAL_COMMUNITY): Payer: Medicaid - Dental | Admitting: Dentistry

## 2014-03-02 ENCOUNTER — Encounter: Payer: Self-pay | Admitting: Radiation Oncology

## 2014-03-02 ENCOUNTER — Ambulatory Visit
Admission: RE | Admit: 2014-03-02 | Discharge: 2014-03-02 | Disposition: A | Discharge status: Home / Self Care | Payer: Medicaid Other | Source: Ambulatory Visit | Attending: Radiation Oncology | Admitting: Radiation Oncology

## 2014-03-02 ENCOUNTER — Ambulatory Visit: Payer: Medicaid Other

## 2014-03-02 ENCOUNTER — Encounter (HOSPITAL_COMMUNITY): Payer: Self-pay | Admitting: Dentistry

## 2014-03-02 VITALS — BP 168/83 | HR 64 | Temp 98.6°F | Wt 211.0 lb

## 2014-03-02 VITALS — BP 164/78 | HR 52 | Temp 98.0°F | Resp 20 | Wt 211.8 lb

## 2014-03-02 DIAGNOSIS — C060 Malignant neoplasm of cheek mucosa: Secondary | ICD-10-CM

## 2014-03-02 DIAGNOSIS — K1233 Oral mucositis (ulcerative) due to radiation: Secondary | ICD-10-CM

## 2014-03-02 DIAGNOSIS — C069 Malignant neoplasm of mouth, unspecified: Secondary | ICD-10-CM

## 2014-03-02 DIAGNOSIS — K000 Anodontia: Secondary | ICD-10-CM

## 2014-03-02 DIAGNOSIS — K08109 Complete loss of teeth, unspecified cause, unspecified class: Secondary | ICD-10-CM

## 2014-03-02 DIAGNOSIS — R682 Dry mouth, unspecified: Secondary | ICD-10-CM

## 2014-03-02 DIAGNOSIS — K117 Disturbances of salivary secretion: Secondary | ICD-10-CM

## 2014-03-02 DIAGNOSIS — Z0189 Encounter for other specified special examinations: Secondary | ICD-10-CM

## 2014-03-02 MED ORDER — MAGIC MOUTHWASH W/LIDOCAINE: ORAL | Status: DC

## 2014-03-02 NOTE — Progress Notes (Signed)
wekly rad txs  Rt neck, 8/33 completed, skin intact, using biafine cream bid, slight discoloration, dty mouth, eating good, no difficulty swallowing food/fluids, using rinses salt water, saw Dr.Kulinski today, gave trimus device with instructions of exercises, no pain, nausea 11:30 AM

## 2014-03-02 NOTE — Patient Instructions (Addendum)
  1. Brush tongue daily. Rinse mouth with salt water after meals and at bedtime as needed. 2. Use trismus exercises as directed. 3. Use Biotene Rinse or salt water/baking soda rinses. 4. Multiple sips of water as needed. 5. Return to clinic on 05/03/2014 after radiation therapy. Call if problems before then.  Lenn Cal, DDS   TRISMUS  Trismus is a condition where the jaw does not allow the mouth to open as wide as it usually does.  This can happen almost suddenly, or in other cases the process is so slow, it is hard to notice it-until it is too far along.  When the jaw joints and/or muscles have been exposed to radiation treatments, the onset of Trismus is very slow.  This is because the muscles are losing their stretching ability over a long period of time, as long as 2 YEARS after the end of radiation.  It is therefore important to exercise these muscles and joints.  TRISMUS EXERCISES   Stack of tongue depressors measuring the same or a little less than the last documented MIO (Maximum Interincisal Opening).  Secure them with a rubber band on both ends.  Place the stack in the patient's mouth, supporting the other end.  Allow 30 seconds for muscle stretching.  Rest for a few seconds.  Repeat 3-5 times  For all radiation patients, this exercise is recommended in the mornings and evenings unless otherwise instructed.  The exercise should be done for a period of 2 YEARS after the end of radiation.  MIO should be checked routinely on recall dental visits by the general dentist or the hospital dentist.  The patient is advised to report any changes, soreness, or difficulties encountered when doing the exercises.

## 2014-03-02 NOTE — Progress Notes (Signed)
   Weekly Management Note:  outpatient Current Dose:  16 Gy  Projected Dose: 66 Gy   Narrative:  The patient presents for routine under treatment assessment.  CBCT/MVCT images/Port film x-rays were reviewed.  The chart was checked. She is doing well.  Denies soreness in mouth or throat.  No pain.  No nausea. Swallowing well.  Dry mouth.  Physical Findings:  weight is 211 lb 12.8 oz (96.072 kg). Her oral temperature is 98 F (36.7 C). Her blood pressure is 164/78 and her pulse is 52. Her respiration is 20 and oxygen saturation is 100%.  slight hyperpigmentation of skin in tx field.  Dry mouth. No thrush or mucositis.  Swelling in right lower face, post operative.    CBC    Component Value Date/Time   WBC 11.2* 02/02/2014 0325   RBC 3.34* 02/02/2014 0325   HGB 11.4* 02/02/2014 0325   HCT 33.5* 02/02/2014 0325   PLT 180 02/02/2014 0325   MCV 100.3* 02/02/2014 0325   MCH 34.1* 02/02/2014 0325   MCHC 34.0 02/02/2014 0325   RDW 13.7 02/02/2014 0325   LYMPHSABS 1.6 01/08/2010 0023   MONOABS 0.5 01/08/2010 0023   EOSABS 0.0 01/08/2010 0023   BASOSABS 0.0 01/08/2010 0023     CMP     Component Value Date/Time   NA 138 02/02/2014 0325   K 4.3 02/02/2014 0325   CL 104 02/02/2014 0325   CO2 24 02/02/2014 0325   GLUCOSE 92 02/02/2014 0325   BUN 11 02/02/2014 0325   CREATININE 0.62 02/02/2014 0325   CREATININE 0.67 09/01/2013 1257   CALCIUM 8.7 02/02/2014 0325   PROT 7.6 09/01/2013 1257   ALBUMIN 4.2 09/01/2013 1257   AST 15 09/01/2013 1257   ALT 10 09/01/2013 1257   ALKPHOS 67 09/01/2013 1257   BILITOT 0.4 09/01/2013 1257   GFRNONAA >90 02/02/2014 0325   GFRNONAA >89 09/01/2013 1257   GFRAA >90 02/02/2014 0325   GFRAA >89 09/01/2013 1257     Impression:  The patient is tolerating radiotherapy.   Plan:  Continue radiotherapy as planned. Gave pt Rx for MMW in case mucositis develops this week.  -----------------------------------  Eppie Gibson, MD

## 2014-03-02 NOTE — Progress Notes (Signed)
03/02/2014  Patient:            Lorraine Tapia Date of Birth:  Jul 17, 1951 MRN:                [id number]  BP 168/83  Pulse 64  Temp(Src) 98.6 F (37 C) (Oral)  Wt 211 lb (95.709 kg)  Kenslei C Sisney presents for periodic oral examination during radiation therapy. Patient has completed 7/33 Treatments.  REVIEW OF CHIEF COMPLAINTS:  DRY MOUTH: Yes HARD TO SWALLOW: No  HURT TO SWALLOW: No TASTE CHANGES: Denies SORES IN MOUTH: No TRISMUS: No problems with trismus symptom WEIGHT: 211 lbs   HOME OH REGIMEN:  BRUSHING: Brushing his tongue daily. FLOSSING: Not applicable RINSING: Using salt water rinses to FLUORIDE: Not applicable TRISMUS EXERCISES:  Maximum interincisal opening: 45 mm. Trismus device fabricated today. Patient instructed on use of trismus device.   DENTAL EXAM:  Oral Hygiene:(PLAQUE): Edentulous LOCATION OF MUCOSITIS: Right buccal mucosa  DESCRIPTION OF SALIVA: Decreased. Foamy saliva. ANY EXPOSED BONE: None noted OTHER WATCHED AREAS: Previous extraction sites. DX: Xerostomia and edentulous, mucositis  RECOMMENDATIONS: 1. Brush tongue daily. Rinse mouth with saltwater after meals and at bedtime as needed. 2. Use trismus exercises as directed. 3. Use Biotene Rinse or salt water/baking soda rinses. 4. Multiple sips of water as needed. 5. Return to clinic on 05/03/2014 after radiation therapy. Call if problems before then.  Lenn Cal, DDS

## 2014-03-03 ENCOUNTER — Ambulatory Visit: Payer: Medicaid Other

## 2014-03-03 ENCOUNTER — Ambulatory Visit
Admission: RE | Admit: 2014-03-03 | Discharge: 2014-03-03 | Disposition: A | Discharge status: Home / Self Care | Payer: Medicaid Other | Source: Ambulatory Visit | Attending: Radiation Oncology | Admitting: Radiation Oncology

## 2014-03-04 ENCOUNTER — Ambulatory Visit
Admission: RE | Admit: 2014-03-04 | Discharge: 2014-03-04 | Disposition: A | Discharge status: Home / Self Care | Payer: Medicaid Other | Source: Ambulatory Visit | Attending: Radiation Oncology | Admitting: Radiation Oncology

## 2014-03-07 ENCOUNTER — Ambulatory Visit: Payer: Medicaid Other

## 2014-03-07 ENCOUNTER — Ambulatory Visit
Admission: RE | Admit: 2014-03-07 | Discharge: 2014-03-07 | Disposition: A | Discharge status: Home / Self Care | Payer: Medicaid Other | Source: Ambulatory Visit | Attending: Radiation Oncology | Admitting: Radiation Oncology

## 2014-03-08 ENCOUNTER — Ambulatory Visit
Admission: RE | Admit: 2014-03-08 | Discharge: 2014-03-08 | Disposition: A | Discharge status: Home / Self Care | Payer: Medicaid Other | Source: Ambulatory Visit | Attending: Radiation Oncology | Admitting: Radiation Oncology

## 2014-03-08 ENCOUNTER — Ambulatory Visit: Payer: Medicaid Other

## 2014-03-09 ENCOUNTER — Encounter: Payer: Self-pay | Admitting: *Deleted

## 2014-03-09 ENCOUNTER — Ambulatory Visit: Payer: Medicaid Other

## 2014-03-09 ENCOUNTER — Ambulatory Visit
Admission: RE | Admit: 2014-03-09 | Discharge: 2014-03-09 | Disposition: A | Discharge status: Home / Self Care | Payer: Medicaid Other | Source: Ambulatory Visit | Attending: Radiation Oncology | Admitting: Radiation Oncology

## 2014-03-10 ENCOUNTER — Ambulatory Visit: Payer: Medicaid Other

## 2014-03-10 ENCOUNTER — Ambulatory Visit
Admission: RE | Admit: 2014-03-10 | Discharge: 2014-03-10 | Disposition: A | Discharge status: Home / Self Care | Payer: Medicaid Other | Source: Ambulatory Visit | Attending: Radiation Oncology | Admitting: Radiation Oncology

## 2014-03-11 ENCOUNTER — Ambulatory Visit: Payer: Medicaid Other

## 2014-03-11 ENCOUNTER — Ambulatory Visit
Admission: RE | Admit: 2014-03-11 | Discharge: 2014-03-11 | Disposition: A | Discharge status: Home / Self Care | Payer: Medicaid Other | Source: Ambulatory Visit | Attending: Radiation Oncology | Admitting: Radiation Oncology

## 2014-03-11 ENCOUNTER — Encounter: Payer: Self-pay | Admitting: Radiation Oncology

## 2014-03-11 VITALS — BP 156/70 | HR 67 | Temp 98.3°F | Resp 20 | Wt 206.1 lb

## 2014-03-11 DIAGNOSIS — C069 Malignant neoplasm of mouth, unspecified: Secondary | ICD-10-CM

## 2014-03-11 MED ORDER — BIAFINE EX EMUL
CUTANEOUS | Status: DC | PRN
  Administered 2014-03-11: 11:00:00 via TOPICAL

## 2014-03-11 NOTE — Progress Notes (Signed)
Weekly rad tx rt neck, discoloration, dark, skin intact,  Using biafine cream, gave 2nd tube , dry mouth, using rinses, appetite good, no difficulty swallowing, no c/o pain, or sob 100% room air sats, no fatigue stated 10:26 AM

## 2014-03-11 NOTE — Progress Notes (Signed)
   Department of Radiation Oncology  Phone:  [phone] Fax:        [phone]  Weekly Treatment Note    Name: Lorraine Tapia Date: 03/11/2014 MRN: [id number] DOB: [birth year]   Current dose: 30 Gy  Current fraction: 15   MEDICATIONS: Current Outpatient Prescriptions  Medication Sig Dispense Refill  . Alum & Mag Hydroxide-Simeth (MAGIC MOUTHWASH W/LIDOCAINE) SOLN 1part nystatin,1part Maaloxplus,1part benadryl,3part 2%viscous lidocaine. Swish and or swallow 10 mL up to QID, 20min before meals/bedtime  480 mL  0  . amLODipine (NORVASC) 5 MG tablet Take 1 tablet (5 mg total) by mouth daily.  30 tablet  0  . chlorhexidine (PERIDEX) 0.12 % solution 15 mLs by Mouth Rinse route 2 (two) times daily.  120 mL  0  . emollient (BIAFINE) cream Apply 1 application topically 2 (two) times daily. Apply to affected neck area after radiation and bedtime daily once skin becomes irritated/erythema      . haloperidol decanoate (HALDOL DECANOATE) 50 MG/ML injection Inject 200 mg into the muscle every 30 (thirty) days. Last dose 01/26/14      . oxyCODONE (OXY IR/ROXICODONE) 5 MG immediate release tablet Take 1 tablet (5 mg total) by mouth every 4 (four) hours as needed for severe pain.  30 tablet  0   Current Facility-Administered Medications  Medication Dose Route Frequency Provider Last Rate Last Dose  . topical emolient (BIAFINE) emulsion   Topical PRN Marye Round, MD         ALLERGIES: Other   LABORATORY DATA:  Lab Results  Component Value Date   WBC 11.2* 02/02/2014   HGB 11.4* 02/02/2014   HCT 33.5* 02/02/2014   MCV 100.3* 02/02/2014   PLT 180 02/02/2014   Lab Results  Component Value Date   NA 138 02/02/2014   K 4.3 02/02/2014   CL 104 02/02/2014   CO2 24 02/02/2014   Lab Results  Component Value Date   ALT 10 09/01/2013   AST 15 09/01/2013   ALKPHOS 67 09/01/2013   BILITOT 0.4 09/01/2013     NARRATIVE: Lorraine Tapia was seen today for weekly treatment management. The chart was  checked and the patient's films were reviewed. The patient is doing well this week she states. No significant new complaints. She is using skin cream daily. No difficulty swallowing. No complaints of pain.  PHYSICAL EXAMINATION: weight is 206 lb 1.6 oz (93.486 kg). Her oral temperature is 98.3 F (36.8 C). Her blood pressure is 156/70 and her pulse is 67. Her respiration is 20 and oxygen saturation is 100%.      the skin shows some hyperpigmentation in the treatment area. Some emerging mucositis within the oral cavity.  ASSESSMENT: The patient is doing satisfactorily with treatment.  PLAN: We will continue with the patient's radiation treatment as planned.

## 2014-03-11 NOTE — Progress Notes (Signed)
Spoke briefly with patient after her daily tmt.  She stated she was doing well with tmts, not experiencing and SEs at this time.  I encouraged her to call me should she have any needs.  She verbalized understanding.  Continuing navigation as L1 patient (new patient).  Gayleen Orem, RN, BSN, [facility] Head & Neck Oncology Navigator [phone]

## 2014-03-14 ENCOUNTER — Ambulatory Visit
Admission: RE | Admit: 2014-03-14 | Discharge: 2014-03-14 | Disposition: A | Discharge status: Home / Self Care | Payer: Medicaid Other | Source: Ambulatory Visit | Attending: Radiation Oncology | Admitting: Radiation Oncology

## 2014-03-14 ENCOUNTER — Ambulatory Visit: Payer: Medicaid Other

## 2014-03-15 ENCOUNTER — Ambulatory Visit
Admission: RE | Admit: 2014-03-15 | Discharge: 2014-03-15 | Disposition: A | Discharge status: Home / Self Care | Payer: Medicaid Other | Source: Ambulatory Visit | Attending: Radiation Oncology | Admitting: Radiation Oncology

## 2014-03-15 ENCOUNTER — Ambulatory Visit: Payer: Medicaid Other

## 2014-03-16 ENCOUNTER — Ambulatory Visit: Payer: Medicaid Other

## 2014-03-16 ENCOUNTER — Ambulatory Visit
Admission: RE | Admit: 2014-03-16 | Discharge: 2014-03-16 | Disposition: A | Discharge status: Home / Self Care | Payer: Medicaid Other | Source: Ambulatory Visit | Attending: Radiation Oncology | Admitting: Radiation Oncology

## 2014-03-17 ENCOUNTER — Telehealth: Payer: Self-pay | Admitting: *Deleted

## 2014-03-17 ENCOUNTER — Ambulatory Visit: Payer: Medicaid Other

## 2014-03-17 ENCOUNTER — Ambulatory Visit
Admission: RE | Admit: 2014-03-17 | Discharge: 2014-03-17 | Disposition: A | Discharge status: Home / Self Care | Payer: Medicaid Other | Source: Ambulatory Visit | Attending: Radiation Oncology | Admitting: Radiation Oncology

## 2014-03-17 NOTE — Telephone Encounter (Signed)
Received call from Kizzie Furnish in charge of transporting  Patient for treatment here stated patient wasn't feeling well this am but would like to come for treatment now today,can this be added back in ?" I took her phone number and gave it to [place], RT therapist who will  Call and arrange for a lter time to have her treatment 11:48 AM

## 2014-03-17 NOTE — Telephone Encounter (Signed)
Called patient home [phone] voice message asking if she was okay,to call back,sorry she missed her radiation treatment today 11:30 AM

## 2014-03-18 ENCOUNTER — Ambulatory Visit
Admission: RE | Admit: 2014-03-18 | Discharge: 2014-03-18 | Disposition: A | Discharge status: Home / Self Care | Payer: Medicaid Other | Source: Ambulatory Visit | Attending: Radiation Oncology | Admitting: Radiation Oncology

## 2014-03-18 ENCOUNTER — Ambulatory Visit: Payer: Medicaid Other

## 2014-03-18 ENCOUNTER — Encounter: Payer: Self-pay | Admitting: Radiation Oncology

## 2014-03-18 VITALS — BP 137/82 | HR 76 | Temp 98.7°F | Resp 20 | Wt 204.8 lb

## 2014-03-18 DIAGNOSIS — C069 Malignant neoplasm of mouth, unspecified: Secondary | ICD-10-CM

## 2014-03-18 NOTE — Progress Notes (Signed)
Weekly rd  Txs, 20/33 completed, dark  Discoloration on riht side face/neck, skin intact, uses biafine bid, no nausea, sob, difficulty swallowing food/fluids,  Is losing taste of foods though,  No fatigue 98% room air sats 10:44 AM

## 2014-03-18 NOTE — Progress Notes (Signed)
   Department of Radiation Oncology  Phone:  [phone] Fax:        [phone]  Weekly Treatment Note    Name: Lorraine Tapia Date: 03/18/2014 MRN: [id number] DOB: 11-23-1951   Current dose: 40 Gy  Current fraction:20   MEDICATIONS: Current Outpatient Prescriptions  Medication Sig Dispense Refill  . Alum & Mag Hydroxide-Simeth (MAGIC MOUTHWASH W/LIDOCAINE) SOLN 1part nystatin,1part Maaloxplus,1part benadryl,3part 2%viscous lidocaine. Swish and or swallow 10 mL up to QID, 23min before meals/bedtime  480 mL  0  . amLODipine (NORVASC) 5 MG tablet Take 1 tablet (5 mg total) by mouth daily.  30 tablet  0  . chlorhexidine (PERIDEX) 0.12 % solution 15 mLs by Mouth Rinse route 2 (two) times daily.  120 mL  0  . emollient (BIAFINE) cream Apply 1 application topically 2 (two) times daily. Apply to affected neck area after radiation and bedtime daily once skin becomes irritated/erythema      . haloperidol decanoate (HALDOL DECANOATE) 50 MG/ML injection Inject 200 mg into the muscle every 30 (thirty) days. Last dose 01/26/14      . oxyCODONE (OXY IR/ROXICODONE) 5 MG immediate release tablet Take 1 tablet (5 mg total) by mouth every 4 (four) hours as needed for severe pain.  30 tablet  0   No current facility-administered medications for this encounter.     ALLERGIES: Other   LABORATORY DATA:  Lab Results  Component Value Date   WBC 11.2* 02/02/2014   HGB 11.4* 02/02/2014   HCT 33.5* 02/02/2014   MCV 100.3* 02/02/2014   PLT 180 02/02/2014   Lab Results  Component Value Date   NA 138 02/02/2014   K 4.3 02/02/2014   CL 104 02/02/2014   CO2 24 02/02/2014   Lab Results  Component Value Date   ALT 10 09/01/2013   AST 15 09/01/2013   ALKPHOS 67 09/01/2013   BILITOT 0.4 09/01/2013     NARRATIVE: Lorraine Tapia was seen today for weekly treatment management. The chart was checked and the patient's films were reviewed. The patient states she is doing very well. She denies any pain in the  facial region or within the oral cavity. No complaints today.  PHYSICAL EXAMINATION: weight is 204 lb 12.8 oz (92.897 kg). Her oral temperature is 98.7 F (37.1 C). Her blood pressure is 137/82 and her pulse is 76. Her respiration is 20 and oxygen saturation is 98%.      hyperpigmentation in the facial region.  ASSESSMENT: The patient is doing satisfactorily with treatment.  PLAN: We will continue with the patient's radiation treatment as planned.

## 2014-03-21 ENCOUNTER — Ambulatory Visit: Payer: Medicaid Other

## 2014-03-21 ENCOUNTER — Ambulatory Visit
Admission: RE | Admit: 2014-03-21 | Discharge: 2014-03-21 | Disposition: A | Discharge status: Home / Self Care | Payer: Medicaid Other | Source: Ambulatory Visit | Attending: Radiation Oncology | Admitting: Radiation Oncology

## 2014-03-22 ENCOUNTER — Ambulatory Visit: Payer: Medicaid Other

## 2014-03-22 ENCOUNTER — Ambulatory Visit
Admission: RE | Admit: 2014-03-22 | Discharge: 2014-03-22 | Disposition: A | Discharge status: Home / Self Care | Payer: Medicaid Other | Source: Ambulatory Visit | Attending: Radiation Oncology | Admitting: Radiation Oncology

## 2014-03-23 ENCOUNTER — Ambulatory Visit: Payer: Medicaid Other

## 2014-03-23 ENCOUNTER — Ambulatory Visit
Admission: RE | Admit: 2014-03-23 | Discharge: 2014-03-23 | Disposition: A | Discharge status: Home / Self Care | Payer: Medicaid Other | Source: Ambulatory Visit | Attending: Radiation Oncology | Admitting: Radiation Oncology

## 2014-03-24 ENCOUNTER — Ambulatory Visit: Payer: Medicaid Other

## 2014-03-24 ENCOUNTER — Ambulatory Visit
Admission: RE | Admit: 2014-03-24 | Discharge: 2014-03-24 | Disposition: A | Discharge status: Home / Self Care | Payer: Medicaid Other | Source: Ambulatory Visit | Attending: Radiation Oncology | Admitting: Radiation Oncology

## 2014-03-25 ENCOUNTER — Telehealth: Payer: Self-pay | Admitting: Radiation Oncology

## 2014-03-25 ENCOUNTER — Encounter: Payer: Self-pay | Admitting: *Deleted

## 2014-03-25 ENCOUNTER — Encounter: Payer: Self-pay | Admitting: Radiation Oncology

## 2014-03-25 ENCOUNTER — Ambulatory Visit
Admission: RE | Admit: 2014-03-25 | Discharge: 2014-03-25 | Disposition: A | Discharge status: Home / Self Care | Payer: Medicaid Other | Source: Ambulatory Visit | Attending: Radiation Oncology | Admitting: Radiation Oncology

## 2014-03-25 ENCOUNTER — Ambulatory Visit: Payer: Medicaid Other

## 2014-03-25 VITALS — BP 169/74 | HR 72 | Resp 16 | Wt 204.3 lb

## 2014-03-25 DIAGNOSIS — C069 Malignant neoplasm of mouth, unspecified: Secondary | ICD-10-CM

## 2014-03-25 NOTE — Progress Notes (Addendum)
Patient reports her right jaw remains numb. Hyperpigmentation without desquamation of right jaw and neck noted. Reports using biafine bid as directed. Reports dry mouth. Denies sores or ulcerations of the mouth. Reports she is unable to taste anything. Denies headache, dizziness, nausea, vomiting, diplopia or ringing in the ears. Denies difficulty swallowing. Blood pressure extremely elevated. Patient denies having a script for bp medication. Will contact [facility] where Dr. Christen Bame prescribed norvasc.

## 2014-03-25 NOTE — Telephone Encounter (Signed)
Spoke with pharmacy tech at [facility]. She reports that no Norvasc script has been received.

## 2014-03-25 NOTE — Progress Notes (Signed)
   Department of Radiation Oncology  Phone:  [phone] Fax:        [phone]  Weekly Treatment Note    Name: Lorraine Tapia Date: 03/25/2014 MRN: [id number] DOB: 1951-09-22   Current dose: 50 Gy  Current fraction: 25   MEDICATIONS: Current Outpatient Prescriptions  Medication Sig Dispense Refill  . emollient (BIAFINE) cream Apply 1 application topically 2 (two) times daily. Apply to affected neck area after radiation and bedtime daily once skin becomes irritated/erythema      . Alum & Mag Hydroxide-Simeth (MAGIC MOUTHWASH W/LIDOCAINE) SOLN 1part nystatin,1part Maaloxplus,1part benadryl,3part 2%viscous lidocaine. Swish and or swallow 10 mL up to QID, 51min before meals/bedtime  480 mL  0  . amLODipine (NORVASC) 5 MG tablet Take 1 tablet (5 mg total) by mouth daily.  30 tablet  0  . chlorhexidine (PERIDEX) 0.12 % solution 15 mLs by Mouth Rinse route 2 (two) times daily.  120 mL  0  . haloperidol decanoate (HALDOL DECANOATE) 50 MG/ML injection Inject 200 mg into the muscle every 30 (thirty) days. Last dose 01/26/14      . oxyCODONE (OXY IR/ROXICODONE) 5 MG immediate release tablet Take 1 tablet (5 mg total) by mouth every 4 (four) hours as needed for severe pain.  30 tablet  0   No current facility-administered medications for this encounter.     ALLERGIES: Other   LABORATORY DATA:  Lab Results  Component Value Date   WBC 11.2* 02/02/2014   HGB 11.4* 02/02/2014   HCT 33.5* 02/02/2014   MCV 100.3* 02/02/2014   PLT 180 02/02/2014   Lab Results  Component Value Date   NA 138 02/02/2014   K 4.3 02/02/2014   CL 104 02/02/2014   CO2 24 02/02/2014   Lab Results  Component Value Date   ALT 10 09/01/2013   AST 15 09/01/2013   ALKPHOS 67 09/01/2013   BILITOT 0.4 09/01/2013     NARRATIVE: Lorraine Tapia was seen today for weekly treatment management. The chart was checked and the patient's films were reviewed. The patient is doing excellent. Continued numbness and change in taste.  Dry mouth. No pain currently.  PHYSICAL EXAMINATION: weight is 204 lb 4.8 oz (92.67 kg). Her blood pressure is 173/103 and her pulse is 73. Her respiration is 16.      hyperpigmentation in the facial region but overall her skin looks excellent. No desquamation. Oral cavity with some moderate mucositis.  ASSESSMENT: The patient is doing satisfactorily with treatment.  PLAN: We will continue with the patient's radiation treatment as planned. Am extremely pleased with how the patient is currently doing.

## 2014-03-25 NOTE — Progress Notes (Signed)
Relayed to Gayleen Orem, RN that patient does not have any blood pressure medication on file at her pharmacy. Because patient's blood pressure has consistently ran high he plans to reach out to the case manager who assist her.

## 2014-03-25 NOTE — Addendum Note (Signed)
Encounter addended by: Heywood Footman, RN on: 03/25/2014  2:12 PM<BR>     Documentation filed: Clinical Notes, Vitals Section

## 2014-03-25 NOTE — Progress Notes (Signed)
Met with patient during weekly UT appt with Dr. Lisbeth Renshaw to provide support and care continuity.  Spoke with accompanying CM Fritz Pickerel re: her elevated BP and our determination that patient has not filled previously issued Norvasc Rx.  I stressed importance of patient seeing her PCP ASAP to address BP.  He stated he would address this when he returned to his office this morning.  Continuing navigation as L2 patient (treatments established).  Gayleen Orem, RN, BSN, [facility] Head & Neck Oncology Navigator [phone]

## 2014-03-28 ENCOUNTER — Ambulatory Visit: Payer: Medicaid Other

## 2014-03-28 ENCOUNTER — Ambulatory Visit
Admission: RE | Admit: 2014-03-28 | Discharge: 2014-03-28 | Disposition: A | Discharge status: Home / Self Care | Payer: Medicaid Other | Source: Ambulatory Visit | Attending: Radiation Oncology | Admitting: Radiation Oncology

## 2014-03-29 ENCOUNTER — Ambulatory Visit
Admission: RE | Admit: 2014-03-29 | Discharge: 2014-03-29 | Disposition: A | Discharge status: Home / Self Care | Payer: Medicaid Other | Source: Ambulatory Visit | Attending: Radiation Oncology | Admitting: Radiation Oncology

## 2014-03-29 ENCOUNTER — Ambulatory Visit: Payer: Medicaid Other

## 2014-03-30 ENCOUNTER — Ambulatory Visit: Payer: Medicaid Other

## 2014-03-30 ENCOUNTER — Ambulatory Visit
Admission: RE | Admit: 2014-03-30 | Discharge: 2014-03-30 | Disposition: A | Discharge status: Home / Self Care | Payer: Medicaid Other | Source: Ambulatory Visit | Attending: Radiation Oncology | Admitting: Radiation Oncology

## 2014-03-31 ENCOUNTER — Ambulatory Visit: Payer: Medicaid Other

## 2014-03-31 ENCOUNTER — Ambulatory Visit
Admission: RE | Admit: 2014-03-31 | Discharge: 2014-03-31 | Disposition: A | Discharge status: Home / Self Care | Payer: Medicaid Other | Source: Ambulatory Visit | Attending: Radiation Oncology | Admitting: Radiation Oncology

## 2014-04-01 ENCOUNTER — Ambulatory Visit: Payer: Medicaid Other

## 2014-04-01 ENCOUNTER — Encounter: Payer: Self-pay | Admitting: *Deleted

## 2014-04-01 ENCOUNTER — Telehealth: Payer: Self-pay | Admitting: Internal Medicine

## 2014-04-01 ENCOUNTER — Ambulatory Visit
Admission: RE | Admit: 2014-04-01 | Discharge: 2014-04-01 | Disposition: A | Discharge status: Home / Self Care | Payer: Medicaid Other | Source: Ambulatory Visit | Attending: Radiation Oncology | Admitting: Radiation Oncology

## 2014-04-01 ENCOUNTER — Encounter: Payer: Self-pay | Admitting: Radiation Oncology

## 2014-04-01 ENCOUNTER — Telehealth: Payer: Self-pay

## 2014-04-01 VITALS — BP 169/100 | HR 73 | Temp 97.5°F | Resp 20 | Wt 199.0 lb

## 2014-04-01 DIAGNOSIS — C801 Malignant (primary) neoplasm, unspecified: Secondary | ICD-10-CM

## 2014-04-01 DIAGNOSIS — C069 Malignant neoplasm of mouth, unspecified: Secondary | ICD-10-CM

## 2014-04-01 MED ORDER — BIAFINE EX EMUL
CUTANEOUS | Status: DC | PRN
  Administered 2014-04-01: 12:00:00 via TOPICAL

## 2014-04-01 NOTE — Progress Notes (Signed)
wekly rad txs 30 completed  Right side neck  Dry desquamation, discoloration, c/o itching, gave another biafine cream, will ask if she needs silvadene cream, patient hasn't started her norvasc yet"I need $3.00  I don't have the money to get this, " gave 43.00 TO PATIENT AND HER HER PROMISE TO GO TODAY AND PICK UP HER NORVASC, SHE STATED SHE WOULD, NO C/O HEADACHE,  Or NAUSEA, , her neck does sting stated patient her b/p =169/100, ",  Eating soft foods, uses MMW and rinses, stated no thrush seen on tongue 10:28 AM

## 2014-04-01 NOTE — Telephone Encounter (Signed)
Pt representative came in today to schedule an appointment to have the patient's BP checked; pt has not been seen in the clinic since 09/02/13; please f/u with pt about a possible nurse visit for her BP before her appt in June

## 2014-04-01 NOTE — Progress Notes (Signed)
Met with patient prior to RT.  Caregiver Jeananne Rama accompanying pt later verified by phone that pt is obtaining BP medication.  Gayleen Orem, RN, BSN, [facility] Head [facility] Neck Oncology Navigator [phone]

## 2014-04-01 NOTE — Progress Notes (Signed)
   Department of Radiation Oncology  Phone:  [phone] Fax:        [phone]  Weekly Treatment Note    Name: Lorraine Tapia Date: 04/01/2014 MRN: [id number] DOB: Jan 29, 1951   Current dose: 60 Gy  Current fraction: 30   MEDICATIONS: Current Outpatient Prescriptions  Medication Sig Dispense Refill  . Alum & Mag Hydroxide-Simeth (MAGIC MOUTHWASH W/LIDOCAINE) SOLN 1part nystatin,1part Maaloxplus,1part benadryl,3part 2%viscous lidocaine. Swish and or swallow 10 mL up to QID, 99min before meals/bedtime  480 mL  0  . chlorhexidine (PERIDEX) 0.12 % solution 15 mLs by Mouth Rinse route 2 (two) times daily.  120 mL  0  . emollient (BIAFINE) cream Apply 1 application topically 2 (two) times daily. Apply to affected neck area after radiation and bedtime daily once skin becomes irritated/erythema      . haloperidol decanoate (HALDOL DECANOATE) 50 MG/ML injection Inject 200 mg into the muscle every 30 (thirty) days. Last dose 01/26/14      . oxyCODONE (OXY IR/ROXICODONE) 5 MG immediate release tablet Take 1 tablet (5 mg total) by mouth every 4 (four) hours as needed for severe pain.  30 tablet  0  . amLODipine (NORVASC) 5 MG tablet Take 1 tablet (5 mg total) by mouth daily.  30 tablet  0   Current Facility-Administered Medications  Medication Dose Route Frequency Provider Last Rate Last Dose  . topical emolient (BIAFINE) emulsion   Topical PRN Marye Round, MD         ALLERGIES: Other   LABORATORY DATA:  Lab Results  Component Value Date   WBC 11.2* 02/02/2014   HGB 11.4* 02/02/2014   HCT 33.5* 02/02/2014   MCV 100.3* 02/02/2014   PLT 180 02/02/2014   Lab Results  Component Value Date   NA 138 02/02/2014   K 4.3 02/02/2014   CL 104 02/02/2014   CO2 24 02/02/2014   Lab Results  Component Value Date   ALT 10 09/01/2013   AST 15 09/01/2013   ALKPHOS 67 09/01/2013   BILITOT 0.4 09/01/2013     NARRATIVE: Lorraine Tapia was seen today for weekly treatment management. The chart was  checked and the patient's films were reviewed. The patient states she is doing fairly well. Increased irritation in the right neck region. The patient to discuss with nursing the issue of obtaining blood pressure medicine and she is going to do this later today. She continues to eat soft the. Using Magic mouthwash.  PHYSICAL EXAMINATION: weight is 199 lb (90.266 kg). Her oral temperature is 97.5 F (36.4 C). Her blood pressure is 169/100 and her pulse is 73. Her respiration is 20.      desquamation has emerged in the right neck region. No moistness but the skin is very close to this occurring. Oral cavity with some mucositis present.  ASSESSMENT: The patient is doing satisfactorily with treatment.  PLAN: We will continue with the patient's radiation treatment as planned. The patient is doing satisfactorily. She will continue her current skin care. We will likely discuss Silvadene when she finishes next week if her skin continues to evolve as expected.

## 2014-04-01 NOTE — Telephone Encounter (Signed)
Called patient to try to schedule a nurse visit For blood pressure check. Patient not available Left message to return our call

## 2014-04-04 ENCOUNTER — Ambulatory Visit: Payer: Medicaid Other

## 2014-04-04 ENCOUNTER — Ambulatory Visit
Admission: RE | Admit: 2014-04-04 | Discharge: 2014-04-04 | Disposition: A | Discharge status: Home / Self Care | Payer: Medicaid Other | Source: Ambulatory Visit | Attending: Radiation Oncology | Admitting: Radiation Oncology

## 2014-04-05 ENCOUNTER — Ambulatory Visit
Admission: RE | Admit: 2014-04-05 | Discharge: 2014-04-05 | Disposition: A | Discharge status: Home / Self Care | Payer: Medicaid Other | Source: Ambulatory Visit | Attending: Radiation Oncology | Admitting: Radiation Oncology

## 2014-04-06 ENCOUNTER — Encounter: Payer: Self-pay | Admitting: Radiation Oncology

## 2014-04-06 ENCOUNTER — Ambulatory Visit
Admission: RE | Admit: 2014-04-06 | Discharge: 2014-04-06 | Disposition: A | Discharge status: Home / Self Care | Payer: Medicaid Other | Source: Ambulatory Visit | Attending: Radiation Oncology | Admitting: Radiation Oncology

## 2014-04-06 VITALS — BP 141/88 | HR 91 | Temp 98.5°F | Ht 70.0 in | Wt 196.0 lb

## 2014-04-06 DIAGNOSIS — C069 Malignant neoplasm of mouth, unspecified: Secondary | ICD-10-CM

## 2014-04-06 NOTE — Progress Notes (Signed)
Lorraine Tapia has completed treatment to her right neck with 33 fractions.  She denies pain.  She reports a poor appetite and has lost 3 lbs since 5/1.  Orthostatic vitals done: bp sitting 159/89, hr 84, bp standing 141/88, hr 91.  She reports a dry mouth.  She denies pain with swallowing.  She is using magic mouthwash twice a day.  The skin on the right side of her neck is red and peeling.  She is using biafine cream.  Follow up appointment card given.

## 2014-04-06 NOTE — Progress Notes (Signed)
   Department of Radiation Oncology  Phone:  [phone] Fax:        [phone]  Weekly Treatment Note    Name: Lorraine Tapia Date: 04/06/2014 MRN: [id number] DOB: 1951/07/24   Current dose: 66 Gy  Current fraction: 33   MEDICATIONS: Current Outpatient Prescriptions  Medication Sig Dispense Refill  . Alum & Mag Hydroxide-Simeth (MAGIC MOUTHWASH W/LIDOCAINE) SOLN 1part nystatin,1part Maaloxplus,1part benadryl,3part 2%viscous lidocaine. Swish and or swallow 10 mL up to QID, 62min before meals/bedtime  480 mL  0  . amLODipine (NORVASC) 5 MG tablet Take 1 tablet (5 mg total) by mouth daily.  30 tablet  0  . emollient (BIAFINE) cream Apply 1 application topically 2 (two) times daily. Apply to affected neck area after radiation and bedtime daily once skin becomes irritated/erythema      . haloperidol decanoate (HALDOL DECANOATE) 50 MG/ML injection Inject 200 mg into the muscle every 30 (thirty) days. Last dose 01/26/14      . oxyCODONE (OXY IR/ROXICODONE) 5 MG immediate release tablet Take 1 tablet (5 mg total) by mouth every 4 (four) hours as needed for severe pain.  30 tablet  0  . chlorhexidine (PERIDEX) 0.12 % solution 15 mLs by Mouth Rinse route 2 (two) times daily.  120 mL  0   No current facility-administered medications for this encounter.     ALLERGIES: Other   LABORATORY DATA:  Lab Results  Component Value Date   WBC 11.2* 02/02/2014   HGB 11.4* 02/02/2014   HCT 33.5* 02/02/2014   MCV 100.3* 02/02/2014   PLT 180 02/02/2014   Lab Results  Component Value Date   NA 138 02/02/2014   K 4.3 02/02/2014   CL 104 02/02/2014   CO2 24 02/02/2014   Lab Results  Component Value Date   ALT 10 09/01/2013   AST 15 09/01/2013   ALKPHOS 67 09/01/2013   BILITOT 0.4 09/01/2013     NARRATIVE: Lorraine Tapia was seen today for weekly treatment management. The chart was checked and the patient's films were reviewed. The patient completed her final fraction today. She states she has done  fine over the last week. Increased skin irritation but she denies any significant pain. She does complain of some itching.  PHYSICAL EXAMINATION: height is 5\' 10"  (1.778 m) and weight is 196 lb (88.905 kg). Her temperature is 98.5 F (36.9 C). Her blood pressure is 141/88 and her pulse is 91. Her oxygen saturation is 100%.      the patient has desquamation in the neck and also at the lateral cancer this on the right which is a new area of desquamation. Mucositis within the oral cavity  ASSESSMENT: The patient is doing satisfactorily with treatment.  PLAN: The patient has completed treatment. She is done satisfactorily although she does have some significant skin irritation at this time. She will continue her current skin care regimen and we will have the patient return to clinic in one month or in followup. The patient knows to contact our office sooner if she has difficulties.

## 2014-04-07 ENCOUNTER — Ambulatory Visit: Payer: Self-pay | Admitting: Internal Medicine

## 2014-04-13 NOTE — Progress Notes (Signed)
  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 The patient was treated to the high-risk local region as well as at risk nodes on the right. The patient was treated using a simultaneous integrated boost technique to dose levels of 66 gray and 60 gray concurrently. This consisted of  IMRT with daily image guidance.   Narrative: The patient tolerated radiation treatment relatively well.    The patient had minimal complaints during treatment. She did experience some due to sinus in the oral cavity and skin irritation which was prominent at the end of treatment.   Plan: The patient has completed radiation treatment. The patient will return to radiation oncology clinic for routine followup in one month. I advised the patient to call or return sooner if they have any questions or concerns related to their recovery or treatment. ________________________________  Jodelle Gross, M.D., Ph.D.

## 2014-05-03 ENCOUNTER — Ambulatory Visit: Payer: Self-pay | Admitting: Internal Medicine

## 2014-05-03 ENCOUNTER — Ambulatory Visit (HOSPITAL_COMMUNITY): Payer: Medicaid - Dental | Admitting: Dentistry

## 2014-05-03 ENCOUNTER — Encounter (HOSPITAL_COMMUNITY): Payer: Self-pay | Admitting: Dentistry

## 2014-05-03 VITALS — BP 150/85 | HR 73 | Temp 98.7°F | Wt 204.0 lb

## 2014-05-03 DIAGNOSIS — K117 Disturbances of salivary secretion: Secondary | ICD-10-CM

## 2014-05-03 DIAGNOSIS — Z0189 Encounter for other specified special examinations: Secondary | ICD-10-CM

## 2014-05-03 DIAGNOSIS — C060 Malignant neoplasm of cheek mucosa: Secondary | ICD-10-CM

## 2014-05-03 DIAGNOSIS — Z923 Personal history of irradiation: Secondary | ICD-10-CM

## 2014-05-03 DIAGNOSIS — R682 Dry mouth, unspecified: Secondary | ICD-10-CM

## 2014-05-03 DIAGNOSIS — K08109 Complete loss of teeth, unspecified cause, unspecified class: Secondary | ICD-10-CM

## 2014-05-03 DIAGNOSIS — R432 Parageusia: Secondary | ICD-10-CM

## 2014-05-03 DIAGNOSIS — K000 Anodontia: Secondary | ICD-10-CM

## 2014-05-03 DIAGNOSIS — K08409 Partial loss of teeth, unspecified cause, unspecified class: Secondary | ICD-10-CM

## 2014-05-03 NOTE — Patient Instructions (Signed)
RECOMMENDATIONS: 1. Brush tongue daily. 2. Use trismus exercises as directed. 3. Use Biotene Rinse or salt water/baking soda rinses. 4. Multiple sips of water as needed. 5. Follow up with Prosthodontist or [facility] in [place], [place] for evaluation for upper and lower complete dentures. Call if problems before then.  Lenn Cal, DDS

## 2014-05-03 NOTE — Progress Notes (Signed)
05/03/2014  Patient:            Lorraine Tapia Date of Birth:  Apr 12, 1951 MRN:                [id number]   BP 150/85  Pulse 73  Temp(Src) 98.7 F (37.1 C) (Oral)  Wt 204 lb (92.534 kg)   [name] presents for periodic oral examination after radiation therapy. Patient has completed all radiation therapy from 02/21/14 thru 04/06/2014.  REVIEW OF CHIEF COMPLAINTS:  DRY MOUTH: Yes. HARD TO SWALLOW: No  HURT TO SWALLOW: NO  TASTE CHANGES: " I don't have no taste" SORES IN MOUTH: No TRISMUS: No problems opening or closing. Doing exercises as instructed. WEIGHT: 204 lbs.  HOME OH REGIMEN:  BRUSHING: Edentulous. Patient reminded to brush tongue daily. FLOSSING: NA RINSING: Rinses with slat water and Biotene mouthwash. FLUORIDE: NA TRISMUS EXERCISES:  Maximum interincisal opening:35mm   DENTAL EXAM:  Oral Hygiene:(PLAQUE): Edentulous. LOCATION OF MUCOSITIS: None noted. DESCRIPTION OF SALIVA: Decreased.  ANY EXPOSED BONE: None noted. OTHER WATCHED AREAS: Right buccal mucosa where previous surgical resection and reconstruction occurred.. DX: Xerostomia, Dysgeusia and Missing teeth/edentulous.  RECOMMENDATIONS: 1. Brush tongue daily. 2. Use trismus exercises as directed. 3. Use Biotene Rinse or salt water/baking soda rinses. 4. Multiple sips of water as needed. 5. Follow up with Prosthodontist or [facility] in [place], [place] for evaluation for upper and lower complete dentures. Call if problems before then.  Lenn Cal, DDS

## 2014-05-11 ENCOUNTER — Encounter: Payer: Self-pay | Admitting: Radiation Oncology

## 2014-05-12 ENCOUNTER — Ambulatory Visit
Admission: RE | Admit: 2014-05-12 | Discharge: 2014-05-12 | Disposition: A | Discharge status: Home / Self Care | Payer: Medicaid Other | Source: Ambulatory Visit | Attending: Radiation Oncology | Admitting: Radiation Oncology

## 2014-05-12 DIAGNOSIS — C069 Malignant neoplasm of mouth, unspecified: Secondary | ICD-10-CM

## 2014-05-12 DIAGNOSIS — C060 Malignant neoplasm of cheek mucosa: Secondary | ICD-10-CM | POA: Diagnosis present

## 2014-05-12 DIAGNOSIS — K1379 Other lesions of oral mucosa: Secondary | ICD-10-CM

## 2014-05-12 DIAGNOSIS — Z79899 Other long term (current) drug therapy: Secondary | ICD-10-CM | POA: Insufficient documentation

## 2014-05-12 DIAGNOSIS — Z923 Personal history of irradiation: Secondary | ICD-10-CM | POA: Insufficient documentation

## 2014-05-12 HISTORY — DX: Personal history of irradiation: Z92.3

## 2014-05-12 MED ORDER — BIAFINE EX EMUL
CUTANEOUS | Status: DC | PRN
Start: 2014-05-12 — End: 2014-05-13
  Administered 2014-05-12: 16:00:00 via TOPICAL

## 2014-05-12 NOTE — Progress Notes (Signed)
Follow up s/p rad txs 02/21/14-04/06/14,  Saw Dr.Kulinki 05/03/14, given exercises to do, ,brush tongue daily, use biotene or rinse with saltwater/baking soda rionses, ate eggs,sausage, no difficulty swallowing stated, neck and right bottom lip, dry desquamation,  Healing ,pink in color, no c/o pain or discomfort, lives in a boarding home, not taking blood pressure mediation stated"I'm allergic to it' No fatigue, no nausea,  4:15 PM

## 2014-05-13 NOTE — Progress Notes (Signed)
  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  The patient states she is doing well at this time. She denies any pain at all which is excellent. She feels that the scan in the cheek has healed well and again no pain in the oral cavity. She is able to be what she likes quite well at this time. No difficulty swallowing. The patient has one area of irritation which remains in the right neck. She states that she does scratch or pick at this at times but she tries not to.                              ALLERGIES:  is allergic to other.  Meds: Current Outpatient Prescriptions  Medication Sig Dispense Refill  . Alum & Mag Hydroxide-Simeth (MAGIC MOUTHWASH W/LIDOCAINE) SOLN 1part nystatin,1part Maaloxplus,1part benadryl,3part 2%viscous lidocaine. Swish and or swallow 10 mL up to QID, 43min before meals/bedtime  480 mL  0  . emollient (BIAFINE) cream Apply 1 application topically 2 (two) times daily. Apply to affected neck area after radiation and bedtime daily once skin becomes irritated/erythema      . haloperidol decanoate (HALDOL DECANOATE) 50 MG/ML injection Inject 200 mg into the muscle every 30 (thirty) days. Last dose 01/26/14      . amLODipine (NORVASC) 5 MG tablet Take 1 tablet (5 mg total) by mouth daily.  30 tablet  0  . chlorhexidine (PERIDEX) 0.12 % solution 15 mLs by Mouth Rinse route 2 (two) times daily.  120 mL  0   No current facility-administered medications for this encounter.    Physical Findings: The patient is in no acute distress. Patient is alert and oriented.  vitals were not taken for this visit..   Oral cavity  looks good with no ongoing mucositis. Some moistness present. The right cheek has healed well with some hypopigmentation at the right lateral canthus. Some hyperpigmentation also present at the area of greatest irritation in the right neck. She has centrally still some healing to do which is scabbed over in the middle of this area of discoloration.  Lab Findings: Lab Results  Component Value Date   WBC 11.2* 02/02/2014   HGB 11.4* 02/02/2014   HCT 33.5* 02/02/2014   MCV 100.3* 02/02/2014   PLT 180 02/02/2014     Radiographic Findings: No results found.  Impression:    I believe that the patient is doing well overall. Am pleased with her recovery. I recommended being very careful with the right neck and letting this he'll. She understands this and will try not to touch this area.  Plan:  The patient has been given some Biafine cream and she will return to clinic in 3 months.   Jodelle Gross, M.D., Ph.D.

## 2014-06-08 ENCOUNTER — Ambulatory Visit: Admission: RE | Admit: 2014-06-08 | Payer: Medicaid Other | Source: Ambulatory Visit | Admitting: Radiation Oncology

## 2014-07-31 ENCOUNTER — Emergency Department (HOSPITAL_COMMUNITY)
Admission: EM | Admit: 2014-07-31 | Discharge: 2014-07-31 | Disposition: A | Discharge status: Home / Self Care | Payer: Medicaid Other | Attending: Emergency Medicine | Admitting: Emergency Medicine

## 2014-07-31 ENCOUNTER — Emergency Department (HOSPITAL_COMMUNITY): Payer: Medicaid Other

## 2014-07-31 ENCOUNTER — Encounter (HOSPITAL_COMMUNITY): Payer: Self-pay | Admitting: Emergency Medicine

## 2014-07-31 DIAGNOSIS — Y9389 Activity, other specified: Secondary | ICD-10-CM | POA: Diagnosis not present

## 2014-07-31 DIAGNOSIS — Z043 Encounter for examination and observation following other accident: Secondary | ICD-10-CM | POA: Diagnosis not present

## 2014-07-31 DIAGNOSIS — F10920 Alcohol use, unspecified with intoxication, uncomplicated: Secondary | ICD-10-CM

## 2014-07-31 DIAGNOSIS — F17200 Nicotine dependence, unspecified, uncomplicated: Secondary | ICD-10-CM | POA: Insufficient documentation

## 2014-07-31 DIAGNOSIS — Z85828 Personal history of other malignant neoplasm of skin: Secondary | ICD-10-CM | POA: Insufficient documentation

## 2014-07-31 DIAGNOSIS — R296 Repeated falls: Secondary | ICD-10-CM | POA: Insufficient documentation

## 2014-07-31 DIAGNOSIS — Z923 Personal history of irradiation: Secondary | ICD-10-CM | POA: Insufficient documentation

## 2014-07-31 DIAGNOSIS — Z8659 Personal history of other mental and behavioral disorders: Secondary | ICD-10-CM | POA: Diagnosis not present

## 2014-07-31 DIAGNOSIS — Z79899 Other long term (current) drug therapy: Secondary | ICD-10-CM | POA: Insufficient documentation

## 2014-07-31 DIAGNOSIS — R220 Localized swelling, mass and lump, head: Secondary | ICD-10-CM | POA: Diagnosis not present

## 2014-07-31 DIAGNOSIS — Y92009 Unspecified place in unspecified non-institutional (private) residence as the place of occurrence of the external cause: Secondary | ICD-10-CM | POA: Insufficient documentation

## 2014-07-31 DIAGNOSIS — R221 Localized swelling, mass and lump, neck: Secondary | ICD-10-CM | POA: Diagnosis not present

## 2014-07-31 DIAGNOSIS — F1010 Alcohol abuse, uncomplicated: Secondary | ICD-10-CM | POA: Diagnosis not present

## 2014-07-31 LAB — CBC WITH DIFFERENTIAL/PLATELET
Basophils Absolute: 0 10*3/uL (ref 0.0–0.1)
Basophils Relative: 0 % (ref 0–1)
Eosinophils Absolute: 0 10*3/uL (ref 0.0–0.7)
Eosinophils Relative: 1 % (ref 0–5)
HEMATOCRIT: 33.5 % — AB (ref 36.0–46.0)
HEMOGLOBIN: 12.5 g/dL (ref 12.0–15.0)
LYMPHS ABS: 0.6 10*3/uL — AB (ref 0.7–4.0)
Lymphocytes Relative: 17 % (ref 12–46)
MCH: 37.8 pg — AB (ref 26.0–34.0)
MCHC: 37.3 g/dL — AB (ref 30.0–36.0)
MCV: 101.2 fL — ABNORMAL HIGH (ref 78.0–100.0)
MONOS PCT: 9 % (ref 3–12)
Monocytes Absolute: 0.3 10*3/uL (ref 0.1–1.0)
Neutro Abs: 2.4 10*3/uL (ref 1.7–7.7)
Neutrophils Relative %: 73 % (ref 43–77)
Platelets: 210 10*3/uL (ref 150–400)
RBC: 3.31 MIL/uL — AB (ref 3.87–5.11)
RDW: 13.4 % (ref 11.5–15.5)
WBC: 3.3 10*3/uL — ABNORMAL LOW (ref 4.0–10.5)

## 2014-07-31 LAB — COMPREHENSIVE METABOLIC PANEL
ALBUMIN: 3.2 g/dL — AB (ref 3.5–5.2)
ALK PHOS: 50 U/L (ref 39–117)
ALT: 12 U/L (ref 0–35)
AST: 17 U/L (ref 0–37)
Anion gap: 16 — ABNORMAL HIGH (ref 5–15)
BUN: 3 mg/dL — AB (ref 6–23)
CO2: 26 mEq/L (ref 19–32)
CREATININE: 0.64 mg/dL (ref 0.50–1.10)
Calcium: 9 mg/dL (ref 8.4–10.5)
Chloride: 98 mEq/L (ref 96–112)
GFR calc non Af Amer: >90 mL/min (ref >90)
GLUCOSE: 69 mg/dL — AB (ref 70–99)
Potassium: 2.7 mEq/L — CL (ref 3.7–5.3)
Sodium: 140 mEq/L (ref 137–147)
TOTAL PROTEIN: 6.8 g/dL (ref 6.0–8.3)
Total Bilirubin: 0.3 mg/dL (ref 0.3–1.2)

## 2014-07-31 LAB — ETHANOL: ALCOHOL ETHYL (B): 305 mg/dL — AB (ref 0–11)

## 2014-07-31 MED ORDER — POTASSIUM CHLORIDE CRYS ER 20 MEQ PO TBCR
40.0000 meq | EXTENDED_RELEASE_TABLET | Freq: Once | ORAL | Status: AC
  Administered 2014-07-31: 40 meq via ORAL
  Filled 2014-07-31: qty 2

## 2014-07-31 MED ORDER — CLINDAMYCIN HCL 150 MG PO CAPS: 300.0000 mg | ORAL_CAPSULE | Freq: Four times a day (QID) | ORAL | Status: DC

## 2014-07-31 NOTE — ED Notes (Signed)
To ct

## 2014-07-31 NOTE — ED Provider Notes (Signed)
CSN: [id number]     Arrival date & time 07/31/14  1716 History   First MD Initiated Contact with Patient 07/31/14 1717     Chief Complaint  Patient presents with  . Fall     (Consider location/radiation/quality/duration/timing/severity/associated sxs/prior Treatment) HPI Comments: Patient presents to the ER for evaluation after a fall. Patient had a witnessed fall outside her apartment complex. Patient has apparently been drinking today. Patient brought to the ER via EMS. She has been denying any complaints during transport. On arrival, she denies headache, neck pain, back pain, chest pain. Patient noted to have swelling of the right side of her face and lip, reports that this is unchanged from previous.  Patient is a 63 y.o. female presenting with fall.  Fall Pertinent negatives include no chest pain, no headaches and no shortness of breath.    Past Medical History  Diagnosis Date  . Psychiatric disorder   . Depression   . Bipolar disorder   . Shortness of breath     with exertion  . 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 Hx of radiation therapy 02/21/14-04/06/14    right buccal 66y/10fx   Past Surgical History  Procedure Laterality Date  . Right buccal mucosa composite resection  10/19/2013    S/P right buccal mucosa composite resection with flap reconstruction and right modified neck dissection on 10/20/2039 by Dr. Fredricka Bonine at [facility]  . Multiple extractions with alveoloplasty N/A 02/01/2014    Procedure: Extraction of tooth #'s 6,7,8,10,11,12,15,16,22,23,25,26,27 with alveoloplasty.;  Surgeon: Lenn Cal, DDS;  Location: [facility] ORS;  Service: Oral Surgery;  Laterality: N/A;  ANESTHESIA: GENERAL WITH NASAL TUBE   Family History  Problem Relation Age of Onset  . Cancer Mother   . Hypertension Father    History  Substance Use Topics  . Smoking status: Current Every Day Smoker -- 0.50 packs/day for 49 years    Types: Cigarettes  . Smokeless tobacco:  Never Used  . Alcohol Use: 1.2 oz/week    2 Cans of beer per week     Comment:  2 24  oz beer occasionally   OB History   Grav Para Term Preterm Abortions TAB SAB Ect Mult Living                 Review of Systems  Respiratory: Negative for shortness of breath.   Cardiovascular: Negative for chest pain.  Neurological: Negative for dizziness, seizures, syncope and headaches.  All other systems reviewed and are negative.     Allergies  Other  Home Medications   Prior to Admission medications   Medication Sig Start Date End Date Taking? Authorizing Provider  Alum & Mag Hydroxide-Simeth (MAGIC MOUTHWASH W/LIDOCAINE) SOLN 1part nystatin,1part Maaloxplus,1part benadryl,3part 2%viscous lidocaine. Swish and or swallow 10 mL up to QID, 58min before meals/bedtime 03/02/14   Eppie Gibson, MD  amLODipine (NORVASC) 5 MG tablet Take 1 tablet (5 mg total) by mouth daily. 02/02/14   Robbie Lis, MD  chlorhexidine (PERIDEX) 0.12 % solution 15 mLs by Mouth Rinse route 2 (two) times daily. 02/02/14   Robbie Lis, MD  emollient (BIAFINE) cream Apply 1 application topically 2 (two) times daily. Apply to affected neck area after radiation and bedtime daily once skin becomes irritated/erythema 02/28/14   Marye Round, MD  haloperidol decanoate (HALDOL DECANOATE) 50 MG/ML injection Inject 200 mg into the muscle every 30 (thirty) days. Last dose 01/26/14    Historical Provider, MD   BP  130/84  Pulse 66  Temp(Src) 98 F (36.7 C) (Oral)  Resp 18  Ht 5\' 9"  (1.753 m)  Wt 206 lb (93.441 kg)  BMI 30.41 kg/m2  SpO2 99% Physical Exam  Constitutional: She is oriented to person, place, and time. She appears well-developed and well-nourished. No distress.  HENT:  Head: Normocephalic and atraumatic.    Right Ear: Hearing normal.  Left Ear: Hearing normal.  Nose: Nose normal.  Mouth/Throat: Oropharynx is clear and moist and mucous membranes are normal.  Eyes: Conjunctivae and EOM are normal. Pupils are  equal, round, and reactive to light.  Neck: Normal range of motion. Neck supple.  Cardiovascular: Regular rhythm, S1 normal and S2 normal.  Exam reveals no gallop and no friction rub.   No murmur heard. Pulmonary/Chest: Effort normal and breath sounds normal. No respiratory distress. She exhibits no tenderness.  Abdominal: Soft. Normal appearance and bowel sounds are normal. There is no hepatosplenomegaly. There is no tenderness. There is no rebound, no guarding, no tenderness at McBurney's point and negative Murphy's sign. No hernia.  Musculoskeletal: Normal range of motion.  Neurological: She is alert and oriented to person, place, and time. She has normal strength. No cranial nerve deficit or sensory deficit. Coordination normal. GCS eye subscore is 4. GCS verbal subscore is 5. GCS motor subscore is 6.  Skin: Skin is warm, dry and intact. No rash noted. No cyanosis.  Psychiatric: She has a normal mood and affect. Her speech is normal and behavior is normal. Thought content normal.    ED Course  Procedures (including critical care time) Labs Review Labs Reviewed  CBC WITH DIFFERENTIAL - Abnormal; Notable for the following:    WBC 3.3 (*)    RBC 3.31 (*)    HCT 33.5 (*)    MCV 101.2 (*)    MCH 37.8 (*)    MCHC 37.3 (*)    Lymphs Abs 0.6 (*)    All other components within normal limits  COMPREHENSIVE METABOLIC PANEL - Abnormal; Notable for the following:    Potassium 2.7 (*)    Glucose, Bld 69 (*)    BUN 3 (*)    Albumin 3.2 (*)    Anion gap 16 (*)    All other components within normal limits  ETHANOL - Abnormal; Notable for the following:    Alcohol, Ethyl (B) 305 (*)    All other components within normal limits    Imaging Review Ct Head Wo Contrast  07/31/2014   CLINICAL DATA:  Witnessed fall.  No loss of consciousness.  EXAM: CT HEAD WITHOUT CONTRAST  CT CERVICAL SPINE WITHOUT CONTRAST  TECHNIQUE: Multidetector CT imaging of the head and cervical spine was performed  following the standard protocol without intravenous contrast. Multiplanar CT image reconstructions of the cervical spine were also generated.  COMPARISON:  Neck CT 07/16/2013.  FINDINGS: CT HEAD FINDINGS  There is no evidence of acute intracranial hemorrhage, mass lesion, brain edema or extra-axial fluid collection. The ventricles and subarachnoid spaces are appropriately sized for age. There is no CT evidence of acute cortical infarction.  There is mild mucosal thickening in the right maxillary sinus. The additional visualized paranasal sinuses are clear without air-fluid levels. The mastoid air cells and middle ears are clear. Calvarial hyperostosis noted. The calvarium is intact.  CT CERVICAL SPINE FINDINGS  The cervical alignment is normal. There is no evidence of acute fracture or traumatic subluxation. The disc spaces are preserved. There is mild disc bulging for age which  appears stable. Postsurgical changes are present within the right neck. There is no recurrent soft tissue mass.  IMPRESSION: No acute intracranial, calvarial or cervical spine findings. Postsurgical changes in the right neck without evidence of recurrent soft tissue mass.   Electronically Signed   By: Camie Patience M.D.   On: 07/31/2014 19:29   Ct Cervical Spine Wo Contrast  07/31/2014   CLINICAL DATA:  Witnessed fall.  No loss of consciousness.  EXAM: CT HEAD WITHOUT CONTRAST  CT CERVICAL SPINE WITHOUT CONTRAST  TECHNIQUE: Multidetector CT imaging of the head and cervical spine was performed following the standard protocol without intravenous contrast. Multiplanar CT image reconstructions of the cervical spine were also generated.  COMPARISON:  Neck CT 07/16/2013.  FINDINGS: CT HEAD FINDINGS  There is no evidence of acute intracranial hemorrhage, mass lesion, brain edema or extra-axial fluid collection. The ventricles and subarachnoid spaces are appropriately sized for age. There is no CT evidence of acute cortical infarction.  There is  mild mucosal thickening in the right maxillary sinus. The additional visualized paranasal sinuses are clear without air-fluid levels. The mastoid air cells and middle ears are clear. Calvarial hyperostosis noted. The calvarium is intact.  CT CERVICAL SPINE FINDINGS  The cervical alignment is normal. There is no evidence of acute fracture or traumatic subluxation. The disc spaces are preserved. There is mild disc bulging for age which appears stable. Postsurgical changes are present within the right neck. There is no recurrent soft tissue mass.  IMPRESSION: No acute intracranial, calvarial or cervical spine findings. Postsurgical changes in the right neck without evidence of recurrent soft tissue mass.   Electronically Signed   By: Camie Patience M.D.   On: 07/31/2014 19:29     EKG Interpretation None      MDM   Final diagnoses:  None   alcohol intoxication  Chronic mouth wound  Patient was brought to the ER by ambulance after a witnessed fall. Patient admits to drinking alcohol tonight. She appears to be intoxicated upon arrival. She is, however, without complaints. Patient is noted to have swelling on the right side of her face and submandibular region. Review of her records reveals a history of head and neck cancer, status post surgical resection and radiation therapy. This explains the findings of the face. There is what appears to be a chronic wound at the corner of the mouth. It is unclear if this was from previous trauma or sequela of the radiation therapy. It does not appear to be acute- tissue is macerated, edges of the wound are white and there is no bleeding. Therefore no repair would be attempted. She can be covered with antibiotics to help facilitate healing and prevent infection. Reviewing her records reveals that Doctor [facility] performed her original surgery. She can be referred back to him and her radiation oncologist for further evaluation. Patient is acutely intoxicated, will need to  be monitored until sober before discharge.    Orpah Greek, MD 07/31/14 [phone]

## 2014-07-31 NOTE — ED Notes (Signed)
Witnessed fall outside in  yard by neighbors who believe pt to be intoxicated no loc no c/o

## 2014-07-31 NOTE — Discharge Instructions (Signed)
Alcohol Intoxication °Alcohol intoxication occurs when you drink enough alcohol that it affects your ability to function. It can be mild or very severe. Drinking a lot of alcohol in a short time is called binge drinking. This can be very harmful. Drinking alcohol can also be more dangerous if you are taking medicines or other drugs. Some of the effects caused by alcohol may include: °· Loss of coordination. °· Changes in mood and behavior. °· Unclear thinking. °· Trouble talking (slurred speech). °· Throwing up (vomiting). °· Confusion. °· Slowed breathing. °· Twitching and shaking (seizures). °· Loss of consciousness. °HOME CARE °· Do not drive after drinking alcohol. °· Drink enough water and fluids to keep your pee (urine) clear or pale yellow. Avoid caffeine. °· Only take medicine as told by your doctor. °GET HELP IF: °· You throw up (vomit) many times. °· You do not feel better after a few days. °· You frequently have alcohol intoxication. Your doctor can help decide if you should see a substance use treatment counselor. °GET HELP RIGHT AWAY IF: °· You become shaky when you stop drinking. °· You have twitching and shaking. °· You throw up blood. It may look bright red or like coffee grounds. °· You notice blood in your poop (bowel movements). °· You become lightheaded or pass out (faint). °MAKE SURE YOU:  °· Understand these instructions. °· Will watch your condition. °· Will get help right away if you are not doing well or get worse. °Document Released: 05/06/2008 Document Revised: 07/21/2013 Document Reviewed: 04/23/2013 °[employer]® Patient Information ©2015 [employer]. This information is not intended to replace advice given to you by your health care provider. Make sure you discuss any questions you have with your health care provider. ° °

## 2014-07-31 NOTE — ED Notes (Addendum)
Pt up out of room attempting to leave, spoke with pt and convinced her to stay long enough for me to discuss with physician the possibility of her going home. Pt states she was going to call a cab. Dr. Betsey Holiday notified.

## 2014-07-31 NOTE — ED Notes (Signed)
No complaints does not want a blanket

## 2014-07-31 NOTE — ED Notes (Signed)
Pt ambulated with steady gait and no assistance.Discharge instructions reviewed with pt, pt verbalized understanding.

## 2014-08-04 ENCOUNTER — Ambulatory Visit
Admission: RE | Admit: 2014-08-04 | Discharge: 2014-08-04 | Disposition: A | Discharge status: Home / Self Care | Payer: Medicaid Other | Source: Ambulatory Visit | Attending: Radiation Oncology | Admitting: Radiation Oncology

## 2014-08-04 ENCOUNTER — Encounter: Payer: Self-pay | Admitting: Radiation Oncology

## 2014-08-04 VITALS — BP 134/70 | HR 84 | Temp 97.6°F | Resp 20 | Ht 69.0 in | Wt 159.9 lb

## 2014-08-04 DIAGNOSIS — C069 Malignant neoplasm of mouth, unspecified: Secondary | ICD-10-CM

## 2014-08-04 NOTE — Progress Notes (Signed)
Follow up  Right buccal rad txs, 02/21/14-04/06/14, has an ulcer right lower lip, some swelling, patient was in ED 07/31/14 from a fall at home, xray neg, but was rx fro clindamycin,hasn't picked up as yet,. Appetite poor, only eating 1 meal a day stated, she had grits,bacon,eggs, this am, drinks water,., no pain or nausea,, enrgy level good 3:31 PM

## 2014-08-05 ENCOUNTER — Telehealth: Payer: Self-pay | Admitting: *Deleted

## 2014-08-05 NOTE — Progress Notes (Signed)
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  The patient states that she has done well. She continues to deny any pain. She indicates that her appetite has been good. No difficulties with swallowing. The patient fell several days ago and presented to the emergency Department. No major injury. He states that her lips have been swollen the last several days.                              ALLERGIES:  is allergic to other.  Meds: Current Outpatient Prescriptions  Medication Sig Dispense Refill  . chlorhexidine (PERIDEX) 0.12 % solution 15 mLs by Mouth Rinse route 2 (two) times daily.  120 mL  0  . haloperidol decanoate (HALDOL DECANOATE) 50 MG/ML injection Inject 200 mg into the muscle every 30 (thirty) days. Last dose 01/26/14      . clindamycin (CLEOCIN) 150 MG capsule Take 2 capsules (300 mg total) by mouth 4 (four) times daily.  80 capsule  0   No current facility-administered medications for this encounter.    Physical Findings: The patient is in no acute distress. Patient is alert and oriented.  height is 5\' 9"  (1.753 m) and weight is 159 lb 14.4 oz (72.53 kg). Her oral temperature is 97.6 F (36.4 C). Her blood pressure is 134/70 and her pulse is 84. Her respiration is 20 and oxygen saturation is 100%. .   Oral cavity clear with no suspicious lesions. Oropharynx clear. Neck is supple without any lymphadenopathy Cardiovascular: Regular rate and rhythm Respiratory: Clear to auscultation bilaterally Extremities: No peripheral edema  Lab Findings: Lab Results  Component Value Date   WBC 3.3* 07/31/2014   HGB 12.5 07/31/2014   HCT  33.5* 07/31/2014   MCV 101.2* 07/31/2014   PLT 210 07/31/2014     Radiographic Findings: Ct Head Wo Contrast  07/31/2014   CLINICAL DATA:  Witnessed fall.  No loss of consciousness.  EXAM: CT HEAD WITHOUT CONTRAST  CT CERVICAL SPINE WITHOUT CONTRAST  TECHNIQUE: Multidetector CT imaging of the head and cervical spine was performed following the standard protocol without intravenous contrast. Multiplanar CT image reconstructions of the cervical spine were also generated.  COMPARISON:  Neck CT 07/16/2013.  FINDINGS: CT HEAD FINDINGS  There is no evidence of acute intracranial hemorrhage, mass lesion, brain edema or extra-axial fluid collection. The ventricles and subarachnoid spaces are appropriately sized for age. There is no CT evidence of acute cortical infarction.  There is mild mucosal thickening in the right maxillary sinus. The additional visualized paranasal sinuses are clear without air-fluid levels. The mastoid air cells and middle ears are clear. Calvarial hyperostosis noted. The calvarium is intact.  CT CERVICAL SPINE FINDINGS  The cervical alignment is normal. There is no evidence of acute fracture or traumatic subluxation. The disc spaces are preserved. There is mild disc bulging for age which appears stable. Postsurgical changes are present within the right neck. There is no recurrent soft tissue mass.  IMPRESSION: No acute intracranial, calvarial or cervical spine findings. Postsurgical changes in the right neck without evidence of recurrent soft  tissue mass.   Electronically Signed   By: Camie Patience M.D.   On: 07/31/2014 19:29   Ct Cervical Spine Wo Contrast  07/31/2014   CLINICAL DATA:  Witnessed fall.  No loss of consciousness.  EXAM: CT HEAD WITHOUT CONTRAST  CT CERVICAL SPINE WITHOUT CONTRAST  TECHNIQUE: Multidetector CT imaging of the head and cervical spine was performed following the standard protocol without intravenous contrast. Multiplanar CT image reconstructions of the cervical  spine were also generated.  COMPARISON:  Neck CT 07/16/2013.  FINDINGS: CT HEAD FINDINGS  There is no evidence of acute intracranial hemorrhage, mass lesion, brain edema or extra-axial fluid collection. The ventricles and subarachnoid spaces are appropriately sized for age. There is no CT evidence of acute cortical infarction.  There is mild mucosal thickening in the right maxillary sinus. The additional visualized paranasal sinuses are clear without air-fluid levels. The mastoid air cells and middle ears are clear. Calvarial hyperostosis noted. The calvarium is intact.  CT CERVICAL SPINE FINDINGS  The cervical alignment is normal. There is no evidence of acute fracture or traumatic subluxation. The disc spaces are preserved. There is mild disc bulging for age which appears stable. Postsurgical changes are present within the right neck. There is no recurrent soft tissue mass.  IMPRESSION: No acute intracranial, calvarial or cervical spine findings. Postsurgical changes in the right neck without evidence of recurrent soft tissue mass.   Electronically Signed   By: Camie Patience M.D.   On: 07/31/2014 19:29    Impression:    The patient appears to be doing satisfactorily at this time. She has no new complaints in terms of pain, difficulty swallowing, or related issues in the head and neck region. She does have some swelling in the lower lip which she states has been present over the last couple of days, potentially related to a fall several days ago. The patient's weight is substantially down today versus prior measurements even several days ago. She denies any difficulties with nausea or appetite.  Plan:  The patient will return to clinic in 4 months for ongoing followup.  I spent 10 minutes with the patient today, the majority of which was spent counseling the patient on the diagnosis of cancer and coordinating care.   Jodelle Gross, M.D., Ph.D.

## 2014-08-05 NOTE — Telephone Encounter (Signed)
Rx that was written by Dr. Harrell Gave j.Polina for cleocin, [facility] didn't have per Gerald Stabs Bynam,patien'ts case Freight forwarder, was either left at d/c from the hospital or patient lost rx, Okay to  [facility] per Dr.moody to [facility] on [address], called Clindamycin(clocin) 150mg  capsule, take 2 capsules (300mg  total) by mouth 4 x a day, toat;l 80 capsule, no refill, called and spoke with Brad,pharmacist at [facility], [phone], he will call Charlynne Cousins when rx is available, 9:43 AM

## 2014-09-26 ENCOUNTER — Encounter (HOSPITAL_COMMUNITY): Payer: Self-pay | Admitting: Emergency Medicine

## 2014-09-26 ENCOUNTER — Emergency Department (HOSPITAL_COMMUNITY): Payer: Medicaid Other

## 2014-09-26 ENCOUNTER — Emergency Department (HOSPITAL_COMMUNITY)
Admission: EM | Admit: 2014-09-26 | Discharge: 2014-09-27 | Disposition: A | Discharge status: Home / Self Care | Payer: Medicaid Other | Attending: Emergency Medicine | Admitting: Emergency Medicine

## 2014-09-26 DIAGNOSIS — Z91199 Patient's noncompliance with other medical treatment and regimen due to unspecified reason: Secondary | ICD-10-CM

## 2014-09-26 DIAGNOSIS — Z85819 Personal history of malignant neoplasm of unspecified site of lip, oral cavity, and pharynx: Secondary | ICD-10-CM | POA: Insufficient documentation

## 2014-09-26 DIAGNOSIS — Z8659 Personal history of other mental and behavioral disorders: Secondary | ICD-10-CM | POA: Diagnosis not present

## 2014-09-26 DIAGNOSIS — Z792 Long term (current) use of antibiotics: Secondary | ICD-10-CM | POA: Insufficient documentation

## 2014-09-26 DIAGNOSIS — F319 Bipolar disorder, unspecified: Secondary | ICD-10-CM

## 2014-09-26 DIAGNOSIS — R4182 Altered mental status, unspecified: Secondary | ICD-10-CM | POA: Insufficient documentation

## 2014-09-26 DIAGNOSIS — Z923 Personal history of irradiation: Secondary | ICD-10-CM | POA: Diagnosis not present

## 2014-09-26 DIAGNOSIS — Z720 Tobacco use: Secondary | ICD-10-CM | POA: Insufficient documentation

## 2014-09-26 DIAGNOSIS — F209 Schizophrenia, unspecified: Secondary | ICD-10-CM

## 2014-09-26 DIAGNOSIS — F312 Bipolar disorder, current episode manic severe with psychotic features: Secondary | ICD-10-CM

## 2014-09-26 DIAGNOSIS — R29810 Facial weakness: Secondary | ICD-10-CM | POA: Diagnosis present

## 2014-09-26 DIAGNOSIS — Z9119 Patient's noncompliance with other medical treatment and regimen: Secondary | ICD-10-CM

## 2014-09-26 LAB — COMPREHENSIVE METABOLIC PANEL
ALK PHOS: 55 U/L (ref 39–117)
ALT: 8 U/L (ref 0–35)
ANION GAP: 16 — AB (ref 5–15)
AST: 14 U/L (ref 0–37)
Albumin: 3.8 g/dL (ref 3.5–5.2)
BUN: 8 mg/dL (ref 6–23)
CO2: 24 mEq/L (ref 19–32)
Calcium: 9.7 mg/dL (ref 8.4–10.5)
Chloride: 99 mEq/L (ref 96–112)
Creatinine, Ser: 0.64 mg/dL (ref 0.50–1.10)
GFR calc non Af Amer: >90 mL/min (ref >90)
GLUCOSE: 72 mg/dL (ref 70–99)
POTASSIUM: 3.6 meq/L — AB (ref 3.7–5.3)
Sodium: 139 mEq/L (ref 137–147)
Total Bilirubin: 2 mg/dL — ABNORMAL HIGH (ref 0.3–1.2)
Total Protein: 8 g/dL (ref 6.0–8.3)

## 2014-09-26 LAB — CBC WITH DIFFERENTIAL/PLATELET
Basophils Absolute: 0 10*3/uL (ref 0.0–0.1)
Basophils Relative: 0 % (ref 0–1)
EOS ABS: 0 10*3/uL (ref 0.0–0.7)
Eosinophils Relative: 1 % (ref 0–5)
HEMATOCRIT: 38.9 % (ref 36.0–46.0)
Hemoglobin: 13.8 g/dL (ref 12.0–15.0)
LYMPHS ABS: 0.4 10*3/uL — AB (ref 0.7–4.0)
Lymphocytes Relative: 8 % — ABNORMAL LOW (ref 12–46)
MCH: 36.8 pg — ABNORMAL HIGH (ref 26.0–34.0)
MCHC: 35.5 g/dL (ref 30.0–36.0)
MCV: 103.7 fL — AB (ref 78.0–100.0)
MONO ABS: 0.5 10*3/uL (ref 0.1–1.0)
MONOS PCT: 9 % (ref 3–12)
Neutro Abs: 4.3 10*3/uL (ref 1.7–7.7)
Neutrophils Relative %: 82 % — ABNORMAL HIGH (ref 43–77)
Platelets: 203 10*3/uL (ref 150–400)
RBC: 3.75 MIL/uL — AB (ref 3.87–5.11)
RDW: 14.8 % (ref 11.5–15.5)
WBC: 5.3 10*3/uL (ref 4.0–10.5)

## 2014-09-26 LAB — RAPID URINE DRUG SCREEN, HOSP PERFORMED
Amphetamines: NOT DETECTED
BENZODIAZEPINES: NOT DETECTED
Barbiturates: NOT DETECTED
Cocaine: NOT DETECTED
Opiates: NOT DETECTED
Tetrahydrocannabinol: NOT DETECTED

## 2014-09-26 LAB — URINE MICROSCOPIC-ADD ON

## 2014-09-26 LAB — ETHANOL

## 2014-09-26 LAB — URINALYSIS, ROUTINE W REFLEX MICROSCOPIC
Glucose, UA: NEGATIVE mg/dL
Hgb urine dipstick: NEGATIVE
Ketones, ur: 40 mg/dL — AB
Nitrite: NEGATIVE
PH: 5.5 (ref 5.0–8.0)
Protein, ur: NEGATIVE mg/dL
SPECIFIC GRAVITY, URINE: 1.02 (ref 1.005–1.030)
Urobilinogen, UA: 1 mg/dL (ref 0.0–1.0)

## 2014-09-26 MED ORDER — HALOPERIDOL LACTATE 5 MG/ML IJ SOLN
5.0000 mg | Freq: Once | INTRAMUSCULAR | Status: AC
  Administered 2014-09-26: 5 mg via INTRAMUSCULAR
  Filled 2014-09-26: qty 1

## 2014-09-26 NOTE — ED Notes (Signed)
Spoke with patient's ACT team nurse. Per Lorraine Laws, RN was attempting to administer monthly injection of Haldol today when patient became defiant and combative with staff. Patient would not allow medication to be injected as usual. Order was obtained for PO Haldol, and patient would not take that medication either. ACT team then called for EMS to transport patient to ED for safety concerns as patient can become violent towards others when un-medicated.

## 2014-09-26 NOTE — ED Notes (Signed)
Daughter arrived at bedside and said she feels like "it's not a physical issue right now, it's a mental health issue."  Daughter reports past hx of pt becoming violent and "the way she looks" is why daughter believes its a mental issue rather then physical.

## 2014-09-26 NOTE — ED Notes (Signed)
Daughter attempted to be called, cell phone number that was listed in demographics is disconnected.  MD Colin Rhein made aware.

## 2014-09-26 NOTE — ED Provider Notes (Signed)
CSN: [id number]     Arrival date & time 09/26/14  1516 History   First MD Initiated Contact with Patient 09/26/14 1546     Chief Complaint  Patient presents with  . Facial Droop     (Consider location/radiation/quality/duration/timing/severity/associated sxs/prior Treatment) Patient is a 63 y.o. female presenting with altered mental status.  Altered Mental Status Presenting symptoms: behavior changes (noncompliance wtih medication)   Severity:  Moderate Most recent episode:  2 days ago Episode history:  Continuous Duration:  48 hours Timing:  Constant Progression:  Unchanged Chronicity:  Recurrent Context comment:  Prior buccal mucosa cancer Associated symptoms: no fever    Patient presents today after police were called for IVC for noncompliance with medical therapy group home. On arrival there group home staff stated that the patient had more facial droop on the right at her baseline. They suggested she be evaluated in the emergency department.  No fevers or other symptoms reported. Per patient daughter the patient is at her baseline and has same issues with prior psychiatric disturbance.   Past Medical History  Diagnosis Date  . Psychiatric disorder   . Depression   . Bipolar disorder   . Shortness of breath     with exertion  . 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 Hx of radiation therapy 02/21/14-04/06/14    right buccal 66y/81fx   Past Surgical History  Procedure Laterality Date  . Right buccal mucosa composite resection  10/19/2013    S/P right buccal mucosa composite resection with flap reconstruction and right modified neck dissection on 10/20/2039 by Dr. Fredricka Bonine at [facility]  . Multiple extractions with alveoloplasty N/A 02/01/2014    Procedure: Extraction of tooth #'s 6,7,8,10,11,12,15,16,22,23,25,26,27 with alveoloplasty.;  Surgeon: Lenn Cal, DDS;  Location: [facility];  Service: Oral Surgery;  Laterality: N/A;  ANESTHESIA: GENERAL WITH NASAL  TUBE   Family History  Problem Relation Age of Onset  . Cancer Mother   . Hypertension Father    History  Substance Use Topics  . Smoking status: Current Every Day Smoker -- 0.50 packs/day for 49 years    Types: Cigarettes  . Smokeless tobacco: Never Used  . Alcohol Use: 1.2 oz/week    2 Cans of beer per week     Comment:  2 24  oz beer occasionally   OB History   Grav Para Term Preterm Abortions TAB SAB Ect Mult Living                 Review of Systems  Unable to perform ROS: Mental status change  Constitutional: Negative for fever.      Allergies  Other  Home Medications   Prior to Admission medications   Medication Sig Start Date End Date Taking? Authorizing Provider  chlorhexidine (PERIDEX) 0.12 % solution 15 mLs by Mouth Rinse route 2 (two) times daily. 02/02/14   Robbie Lis, MD  clindamycin (CLEOCIN) 150 MG capsule Take 300 mg by mouth 4 (four) times daily. rx was lost, Dr.moody re- ordered same rx for patient 07/31/14   Orpah Greek, MD  haloperidol decanoate (HALDOL DECANOATE) 50 MG/ML injection Inject 200 mg into the muscle every 30 (thirty) days. Last dose 01/26/14    Historical Provider, MD   BP 164/85  Pulse 68  Temp(Src) 98.4 F (36.9 C) (Oral)  Resp 16  SpO2 99% Physical Exam  Vitals reviewed. Constitutional: She appears well-developed and well-nourished.  HENT:  Head: Atraumatic.  Right Ear: External ear  normal.  Left Ear: External ear normal.  Mandibular and submandibular swelling on R  Eyes: Conjunctivae and EOM are normal. Pupils are equal, round, and reactive to light.  Neck: Normal range of motion. Neck supple.  Cardiovascular: Normal rate, regular rhythm, normal heart sounds and intact distal pulses.   Pulmonary/Chest: Effort normal and breath sounds normal.  Abdominal: Soft. Bowel sounds are normal. There is no tenderness.  Musculoskeletal: Normal range of motion.  Neurological: She is alert.  MAE, refuses to answer most  questions but has purposeful responses and follows basic commands  Skin: Skin is warm and dry.    ED Course  Procedures (including critical care time) Labs Review Labs Reviewed  CBC WITH DIFFERENTIAL - Abnormal; Notable for the following:    RBC 3.75 (*)    MCV 103.7 (*)    MCH 36.8 (*)    Neutrophils Relative % 82 (*)    Lymphocytes Relative 8 (*)    Lymphs Abs 0.4 (*)    All other components within normal limits  COMPREHENSIVE METABOLIC PANEL - Abnormal; Notable for the following:    Potassium 3.6 (*)    Total Bilirubin 2.0 (*)    Anion gap 16 (*)    All other components within normal limits  URINALYSIS, ROUTINE W REFLEX MICROSCOPIC - Abnormal; Notable for the following:    Color, Urine AMBER (*)    Bilirubin Urine MODERATE (*)    Ketones, ur 40 (*)    Leukocytes, UA TRACE (*)    All other components within normal limits  URINE MICROSCOPIC-ADD ON - Abnormal; Notable for the following:    Bacteria, UA FEW (*)    Casts HYALINE CASTS (*)    All other components within normal limits  URINE RAPID DRUG SCREEN (HOSP PERFORMED)  ETHANOL  AMMONIA    Imaging Review Dg Chest 1 View  09/26/2014   CLINICAL DATA:  Patient combative today, acting out and aggressive, which is abnormal behavior. Altered mental status.  EXAM: CHEST - 1 VIEW  COMPARISON:  01/31/2014.  FINDINGS: Cardiac silhouette is normal in size. No mediastinal or hilar masses or evidence of adenopathy. Lungs are clear. No pleural effusion or pneumothorax.  Bony thorax is intact.  IMPRESSION: No active disease.   Electronically Signed   By: Lajean Manes M.D.   On: 09/26/2014 19:16   Ct Head Wo Contrast  09/26/2014   CLINICAL DATA:  Altered mental status. History of head and neck cancer, radiation therapy May 2015.  EXAM: CT HEAD WITHOUT CONTRAST  TECHNIQUE: Contiguous axial images were obtained from the base of the skull through the vertex without intravenous contrast.  COMPARISON:  CT of the head and cervical spine  July 31, 2014  FINDINGS: The ventricles and sulci are normal. No intraparenchymal hemorrhage, mass effect nor midline shift. No acute large vascular territory infarcts. Mild white matter changes suggest chronic small vessel ischemic disease.  No abnormal extra-axial fluid collections. Basal cisterns are patent. Extensive dural calcifications.  No skull fracture. The included ocular globes and orbital contents are non-suspicious. Mild chronic RIGHT maxillary sinusitis without paranasal sinus air-fluid levels. The mastoid air cells are well aerated, pneumatized LEFT greater than RIGHT petrous apices. Patient is edentulous.  IMPRESSION: No acute intracranial process.  Mild white matter changes suggest chronic small vessel ischemic disease.   Electronically Signed   By: Elon Alas   On: 09/26/2014 22:22     EKG Interpretation None      MDM   Final diagnoses:  Altered mental state    62 y.o. female with pertinent PMH of R buccal mucosa cancer, bipolar do, depression presents from group home with concern for increased R facial droop x 2 days, however immediate precipitant was police arriving to have the pt served with IVC paperwork for medical compliance.  On arrival the pt has normal vitals and no focal neuro deficits on a limited exam secondary to compliance with commands. Spoke with ACT team member latishia who states that pt has been more aggressive and less interactive over the last 2 days spontaneously   She refused to take haldol.  Per the pt's daughter this is how she behaves during psychiatric outbreaks, however there is no record of this in the chart, the pt's daughter immediately left without leaving a callback number, and her ACT team is not aware of any similar occurrences and state that the pt normally is awake, alert, and answers all questions appropriately.  Pt currently uses inappropriate words and phrases, is awake, but is not safe to return home under her own care.  UA, CT head,  and other wu unremarkable.  Consulted medicine, neurology (who recommend MR and EEG), and TTS.    1. Altered mental state         Debby Freiberg, MD 09/27/14 [phone]

## 2014-09-26 NOTE — ED Notes (Signed)
Pt resistant to having vital signs obtained.  Pt mumbling, unable to understand what pt is saying.  Pt only stares at you.

## 2014-09-26 NOTE — BH Assessment (Signed)
Neurology will see pt prior to TTS assessment.

## 2014-09-26 NOTE — ED Notes (Addendum)
MD Colin Rhein at bedside for evaluation.

## 2014-09-26 NOTE — ED Notes (Addendum)
RN called pts case manager Serafina Mitchell, can be reached at [phone]) to find out number for pt's group home d/t unable to get in contact with family for information about pt's chief complaint.  Case manager reported that pt does not live in a group home, but lives in a boarding house off [facility] with a phone number of [phone].  He reports that boarding house called him today bc pt was very combative, acting out, and aggressive which is not pts normal.  They wanted order from psychiatrist for Haldol 10 mg that was given around 12ish today.   He was unsure if IVC paperwork was completed and taken out on pt d/t how combative she was.  Case manager reports that boarding house wanted pt to be seen regarding mental state bc of how combative and aggressive she has been.  MD Colin Rhein made aware of update.

## 2014-09-26 NOTE — ED Notes (Addendum)
Pt lives at a group home and started acting abnormal 2 days ago.  Today pt started refusing medications, will not follow commands, and change in LOC.  Pt normally has right side of facial swelling, but staff reports increased swelling today.  EMS reports unable to obtain stroke scale d/t pt mental status.

## 2014-09-27 DIAGNOSIS — F259 Schizoaffective disorder, unspecified: Secondary | ICD-10-CM

## 2014-09-27 DIAGNOSIS — F312 Bipolar disorder, current episode manic severe with psychotic features: Secondary | ICD-10-CM

## 2014-09-27 DIAGNOSIS — F1020 Alcohol dependence, uncomplicated: Secondary | ICD-10-CM

## 2014-09-27 DIAGNOSIS — Z9119 Patient's noncompliance with other medical treatment and regimen: Secondary | ICD-10-CM

## 2014-09-27 DIAGNOSIS — F319 Bipolar disorder, unspecified: Secondary | ICD-10-CM

## 2014-09-27 DIAGNOSIS — R4182 Altered mental status, unspecified: Secondary | ICD-10-CM

## 2014-09-27 LAB — AMMONIA: Ammonia: 20 umol/L (ref 11–60)

## 2014-09-27 MED ORDER — IBUPROFEN 200 MG PO TABS: 600.0000 mg | ORAL_TABLET | Freq: Three times a day (TID) | ORAL | Status: DC | PRN

## 2014-09-27 MED ORDER — NICOTINE 21 MG/24HR TD PT24: 21.0000 mg | MEDICATED_PATCH | Freq: Every day | TRANSDERMAL | Status: DC

## 2014-09-27 MED ORDER — ACETAMINOPHEN 325 MG PO TABS
650.0000 mg | ORAL_TABLET | ORAL | Status: DC | PRN
Start: 2014-09-27 — End: 2014-09-27

## 2014-09-27 MED ORDER — HALOPERIDOL DECANOATE 100 MG/ML IM SOLN
200.0000 mg | Freq: Once | INTRAMUSCULAR | Status: AC
  Administered 2014-09-27: 200 mg via INTRAMUSCULAR
  Filled 2014-09-27: qty 2

## 2014-09-27 MED ORDER — ALUM & MAG HYDROXIDE-SIMETH 200-200-20 MG/5ML PO SUSP: 30.0000 mL | ORAL | Status: DC | PRN
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
## 2014-09-27 NOTE — ED Notes (Signed)
Called [facility] again - advised she is waiting to hear back from [place].

## 2014-09-27 NOTE — ED Notes (Addendum)
[facility] staff member advised she will contact Serafina Mitchell, pt's counselor, to inquire about who is available to pick up pt. She will call RN back to advise. [phone]

## 2014-09-27 NOTE — Consult Note (Signed)
[facility] Medical Consultation  Lorraine Tapia AYT:[id number] DOB: 1951/08/21 DOA: 09/26/2014 PCP: Angelica Chessman, MD   Requesting physician: Colin Rhein Date of consultation: 09/27/14 Reason for consultation: AMS  Impression/Recommendations Active Problems:   Bipolar disorder with psychotic features    1. Likely acute psychosis due to missing halidol, now resolved after treatment with halidol - No evidence to suggest acute neurologic or medical condition causing the patients alteration in behavior.  Patients aggressiveness is resolved at the time of my evaluation, she is sleepy but answering questions quite appropriately.  Given the history of similar psychiatric presentations and subsequent resolution of aggressive behavior when treated with Haldol (the medication that she was missing today), strongly suspect a psychiatric etiology to her presentation.  Would recommend having ACT team re-evaluate patient and see how far she is from baseline at this point (I suspect she may be very close to or even at baseline mental status at this point other than being sleepy from the haldol dose).  Chief Complaint: AMS  HPI:  63 yo F who was brought to ED from her group home.  Patient unable to provide history.  It seems that patient started acting abnormal 2 days ago.  Today patient refused her monthly IM injection of Haldol she receives.  She would not follow commands and became combative when they tried to give either IM or PO Haldol to her so her ACT team sent her to the ED for evaluation.  ACT team reports that patient was transported to ED due to safety concerns as patient "can become violent towards others when un-medicated".  Daughter who was present on arrival stated that she has done this in the past when she didn't get her Haldol.  Patient was given 5mg  Haldol IM in the ED.  Review of Systems:  Systems reviewed.  Negative except as per HPI.  Past Medical History  Diagnosis Date   . Psychiatric disorder   . Depression   . Bipolar disorder   . Shortness of breath     with exertion  . 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 Hx of radiation therapy 02/21/14-04/06/14    right buccal 66y/4fx   Past Surgical History  Procedure Laterality Date  . Right buccal mucosa composite resection  10/19/2013    S/P right buccal mucosa composite resection with flap reconstruction and right modified neck dissection on 10/20/2039 by Dr. Fredricka Bonine at [facility]  . Multiple extractions with alveoloplasty N/A 02/01/2014    Procedure: Extraction of tooth #'s 6,7,8,10,11,12,15,16,22,23,25,26,27 with alveoloplasty.;  Surgeon: Lenn Cal, DDS;  Location: [facility];  Service: Oral Surgery;  Laterality: N/A;  ANESTHESIA: GENERAL WITH NASAL TUBE   Social History:  reports that she has been smoking Cigarettes.  She has a 24.5 pack-year smoking history. She has never used smokeless tobacco. She reports that she drinks about 1.2 ounces of alcohol per week. She reports that she does not use illicit drugs.  Allergies  Allergen Reactions  . Other Itching    Rash and itching with pepper   Family History  Problem Relation Age of Onset  . Cancer Mother   . Hypertension Father     Prior to Admission medications   Medication Sig Start Date End Date Taking? Authorizing Provider  chlorhexidine (PERIDEX) 0.12 % solution 15 mLs by Mouth Rinse route 2 (two) times daily. 02/02/14   Robbie Lis, MD  clindamycin (CLEOCIN) 150 MG capsule Take 300 mg by mouth 4 (four) times daily. rx  was lost, Dr.moody re- ordered same rx for patient 07/31/14   Orpah Greek, MD  haloperidol decanoate (HALDOL DECANOATE) 50 MG/ML injection Inject 200 mg into the muscle every 30 (thirty) days. Last dose 01/26/14    Historical Provider, MD   Physical Exam: Blood pressure 142/70, pulse 79, temperature 98.4 F (36.9 C), temperature source Oral, resp. rate 18, SpO2 99.00%. Filed Vitals:   09/27/14 0143   BP: 142/70  Pulse: 79  Temp:   Resp: 18    General:  NAD, resting comfortably in bed Eyes: PEERLA EOMI ENT: mucous membranes moist Neck: supple w/o JVD Cardiovascular: RRR w/o MRG Respiratory: CTA B Abdomen: soft, nt, nd, bs+ Skin: no rash nor lesion Musculoskeletal: MAE, full ROM all 4 extremities Psychiatric: normal tone and affect Neurologic: AAOx3, grossly non-focal, Answers most questions appropriately though she complains she is sleepy right now, will follow commands with all extremities.  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 Lisbeth Renshaw over at the cancer center).  Labs on Admission:  Basic Metabolic Panel:  Recent Labs Lab 09/26/14 1743  NA 139  K 3.6*  CL 99  CO2 24  GLUCOSE 72  BUN 8  CREATININE 0.64  CALCIUM 9.7   Liver Function Tests:  Recent Labs Lab 09/26/14 1743  AST 14  ALT 8  ALKPHOS 55  BILITOT 2.0*  PROT 8.0  ALBUMIN 3.8   No results found for this basename: LIPASE, AMYLASE,  in the last 168 hours  Recent Labs Lab 09/26/14 2308  AMMONIA 20   CBC:  Recent Labs Lab 09/26/14 1743  WBC 5.3  NEUTROABS 4.3  HGB 13.8  HCT 38.9  MCV 103.7*  PLT 203   Cardiac Enzymes: No results found for this basename: CKTOTAL, CKMB, CKMBINDEX, TROPONINI,  in the last 168 hours BNP: No components found with this basename: POCBNP,  CBG: No results found for this basename: GLUCAP,  in the last 168 hours  Radiological Exams on Admission: Dg Chest 1 View  09/26/2014   CLINICAL DATA:  Patient combative today, acting out and aggressive, which is abnormal behavior. Altered mental status.  EXAM: CHEST - 1 VIEW  COMPARISON:  01/31/2014.  FINDINGS: Cardiac silhouette is normal in size. No mediastinal or hilar masses or evidence of adenopathy. Lungs are clear. No pleural effusion or pneumothorax.  Bony  thorax is intact.  IMPRESSION: No active disease.   Electronically Signed   By: Lajean Manes M.D.   On: 09/26/2014 19:16   Ct Head Wo Contrast  09/26/2014   CLINICAL DATA:  Altered mental status. History of head and neck cancer, radiation therapy May 2015.  EXAM: CT HEAD WITHOUT CONTRAST  TECHNIQUE: Contiguous axial images were obtained from the base of the skull through the vertex without intravenous contrast.  COMPARISON:  CT of the head and cervical spine July 31, 2014  FINDINGS: The ventricles and sulci are normal. No intraparenchymal hemorrhage, mass effect nor midline shift. No acute large vascular territory infarcts. Mild white matter changes suggest chronic small vessel ischemic disease.  No abnormal extra-axial fluid collections. Basal cisterns are patent. Extensive dural calcifications.  No skull fracture. The included ocular globes and orbital contents are non-suspicious. Mild chronic RIGHT maxillary sinusitis without paranasal sinus air-fluid levels. The mastoid air cells are well aerated, pneumatized LEFT greater than RIGHT petrous apices. Patient is edentulous.  IMPRESSION: No acute intracranial process.  Mild white matter changes suggest chronic small vessel ischemic disease.   Electronically Signed   By: Elon Alas   On: 09/26/2014 22:22    EKG: Independently reviewed.  Time spent: 70 min  GARDNER, JARED M. [facility] Pager [phone]  If 7PM-7AM, please contact night-coverage [email or web address] Password [email or web address] 09/27/2014, 3:32 AM

## 2014-09-27 NOTE — BH Assessment (Signed)
Assessment completed. Consulted Patriciaann Clan, PA-C who recommended that pt be evaluated by psychiatry in the morning. Dr. Colin Rhein has been made aware of the recommendation.

## 2014-09-27 NOTE — ED Notes (Signed)
Multiple attempts called to [facility] to determine the status on paper work for [employer] comments per Jillene Bucks with [facility].  Will try again or have oncoming RN make follow up calls.

## 2014-09-27 NOTE — Discharge Instructions (Signed)
Schizophrenia °Schizophrenia is a mental illness. It may cause disturbed or disorganized thinking, speech, or behavior. People with schizophrenia have problems functioning in one or more areas of life: work, school, home, or relationships. People with schizophrenia are at increased risk for suicide, certain chronic physical illnesses, and unhealthy behaviors, such as smoking and drug use. °People who have family members with schizophrenia are at higher risk of developing the illness. Schizophrenia affects men and women equally but usually appears at an earlier age (teenage or early adult years) in men.  °SYMPTOMS °The earliest symptoms are often subtle (prodrome) and may go unnoticed until the illness becomes more severe (first-break psychosis). Symptoms of schizophrenia may be continuous or may come and go in severity. Episodes often are triggered by major life events, such as family stress, college, military service, marriage, pregnancy or child birth, divorce, or loss of a loved one. People with schizophrenia may see, hear, or feel things that do not exist (hallucinations). They may have false beliefs in spite of obvious proof to the contrary (delusions). Sometimes speech is incoherent or behavior is odd or withdrawn.  °DIAGNOSIS °Schizophrenia is diagnosed through an assessment by your caregiver. Your caregiver will ask questions about your thoughts, behavior, mood, and ability to function in daily life. Your caregiver may ask questions about your medical history and use of alcohol or drugs, including prescription medication. Your caregiver may also order blood tests and imaging exams. Certain medical conditions and substances can cause symptoms that resemble schizophrenia. Your caregiver may refer you to a mental health specialist for evaluation. There are three major criterion for a diagnosis of schizophrenia: °· Two or more of the following five symptoms are present for a month or longer: °¨ Delusions. Often  the delusions are that you are being attacked, harassed, cheated, persecuted or conspired against (persecutory delusions). °¨ Hallucinations.   °¨ Disorganized speech that does not make sense to others. °¨ Grossly disorganized (confused or unfocused) behavior or extremely overactive or underactive motor activity (catatonia). °¨ Negative symptoms such as bland or blunted emotions (flat affect), loss of will power (avolition), and withdrawal from social contacts (social isolation). °· Level of functioning in one or more major areas of life (work, school, relationships, or self-care) is markedly below the level of functioning before the onset of illness.   °· There are continuous signs of illness (either mild symptoms or decreased level of functioning) for at least 6 months or longer. °TREATMENT  °Schizophrenia is a long-term illness. It is best controlled with continuous treatment rather than treatment only when symptoms occur. The following treatments are used to manage schizophrenia: °· Medication--Medication is the most effective and important form of treatment for schizophrenia. Antipsychotic medications are usually prescribed to help manage schizophrenia. Other types of medication may be added to relieve any symptoms that may occur despite the use of antipsychotic medications. °· Counseling or talk therapy--Individual, group, or family counseling may be helpful in providing education, support, and guidance. Many people with schizophrenia also benefit from social skills and job skills (vocational) training. °A combination of medication and counseling is best for managing the disorder over time. A procedure in which electricity is applied to the brain through the scalp (electroconvulsive therapy) may be used to treat catatonic schizophrenia or schizophrenia in people who cannot take or do not respond to medication and counseling. °Document Released: 11/15/2000 Document Revised: 07/21/2013 Document Reviewed:  02/10/2013 °ExitCare® Patient Information ©2015 [employer]. This information is not intended to replace advice given to you by   your health care provider. Make sure you discuss any questions you have with your health care provider. ° °

## 2014-09-27 NOTE — Consult Note (Signed)
Reason for Consult:Altered mental status Referring Physician: Colin Rhein  CC: Altered mental status  HPI: Lorraine Tapia is an 63 y.o. female who was brought to the ED from her group home.  Patient unable to provide history and all history obtained from the chart.  It seems that the patient started acting abnormal 2 days ago.  Today the patient refused medications.  She would not follow commands and became combative.  There was some question as to whether her baseline facial droop was worse. Patient was brought in for IVC.  With arrival of family they reported that this was a typical presentation for the patient when she is having psychiatric issues.    Past Medical History  Diagnosis Date  . Psychiatric disorder   . Depression   . Bipolar disorder   . Shortness of breath     with exertion  . 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 Hx of radiation therapy 02/21/14-04/06/14    right buccal 66y/51fx    Past Surgical History  Procedure Laterality Date  . Right buccal mucosa composite resection  10/19/2013    S/P right buccal mucosa composite resection with flap reconstruction and right modified neck dissection on 10/20/2039 by Dr. Fredricka Bonine at [facility]  . Multiple extractions with alveoloplasty N/A 02/01/2014    Procedure: Extraction of tooth #'s 6,7,8,10,11,12,15,16,22,23,25,26,27 with alveoloplasty.;  Surgeon: Lenn Cal, DDS;  Location: [facility];  Service: Oral Surgery;  Laterality: N/A;  ANESTHESIA: GENERAL WITH NASAL TUBE    Family History  Problem Relation Age of Onset  . Cancer Mother   . Hypertension Father     Social History:  reports that she has been smoking Cigarettes.  She has a 24.5 pack-year smoking history. She has never used smokeless tobacco. She reports that she drinks about 1.2 ounces of alcohol per week. She reports that she does not use illicit drugs.  Allergies  Allergen Reactions  . Other Itching    Rash and itching with pepper     Medications: I have reviewed the patient's current medications. Prior to Admission:  Current outpatient prescriptions: chlorhexidine (PERIDEX) 0.12 % solution, 15 mLs by Mouth Rinse route 2 (two) times daily., Disp: 120 mL, Rfl: 0;   clindamycin (CLEOCIN) 150 MG capsule, Take 300 mg by mouth 4 (four) times daily. rx was lost, Dr.moody re- ordered same rx for patient, Disp: , Rfl: ;   haloperidol decanoate (HALDOL DECANOATE) 50 MG/ML injection, Inject 200 mg into the muscle every 30 (thirty) days. Last dose 01/26/14, Disp: , Rfl:   ROS: History obtained from chart review  General ROS: negative for - chills, fatigue, fever, night sweats, weight gain or weight loss Psychological ROS: as noted in HPI Ophthalmic ROS: negative for - blurry vision, double vision, eye pain or loss of vision ENT ROS: negative for - epistaxis, nasal discharge, oral lesions, sore throat, tinnitus or vertigo Allergy and Immunology ROS: negative for - hives or itchy/watery eyes Hematological and Lymphatic ROS: negative for - bleeding problems, bruising or swollen lymph nodes Endocrine ROS: negative for - galactorrhea, hair pattern changes, polydipsia/polyuria or temperature intolerance Respiratory ROS: negative for - cough, hemoptysis, shortness of breath or wheezing Cardiovascular ROS: negative for - chest pain, dyspnea on exertion, edema or irregular heartbeat Gastrointestinal ROS: negative for - abdominal pain, diarrhea, hematemesis, nausea/vomiting or stool incontinence Genito-Urinary ROS: negative for - dysuria, hematuria, incontinence or urinary frequency/urgency Musculoskeletal ROS: negative for - joint swelling or muscular weakness Neurological ROS: as noted  in HPI Dermatological ROS: negative for rash and skin lesion changes  Physical Examination: Blood pressure 142/70, pulse 79, temperature 98.4 F (36.9 C), temperature source Oral, resp. rate 18, SpO2 99.00%.  Neurologic Examination Mental  Status: Lethargic.  Knew she was in the hospital but reported that it was [place].  Reported she was unable to tell me the date.  Asked me to leave her alone to sleep.  Speech fluent.  Follows commands.  Cranial Nerves: II: Discs flat bilaterally; Pupils equal, round, reactive to light and accommodation III,IV, VI: ptosis not present, extra-ocular motions grossly intact  V,VII: right facial droop VIII: hearing normal bilaterally IX,X: gag reflex present XI: bilateral shoulder shrug XII: midline tongue extension Motor: Right : Upper extremity   5/5    Left:     Upper extremity   5/5  Lower extremity   5/5     Lower extremity   5/5 Tone and bulk:normal tone throughout; no atrophy noted Sensory: Responds to tactile stimuli in all extremities Deep Tendon Reflexes: 2+ and symmetric with absent AJ's bilaterally Plantars: Right: mute   Left: mute Cerebellar: Unable to perform secondary to lethargy Gait: Unable to perform secondary to lethargy CV: pulses palpable throughout     Laboratory Studies:   Basic Metabolic Panel:  Recent Labs Lab 09/26/14 1743  NA 139  K 3.6*  CL 99  CO2 24  GLUCOSE 72  BUN 8  CREATININE 0.64  CALCIUM 9.7    Liver Function Tests:  Recent Labs Lab 09/26/14 1743  AST 14  ALT 8  ALKPHOS 55  BILITOT 2.0*  PROT 8.0  ALBUMIN 3.8   No results found for this basename: LIPASE, AMYLASE,  in the last 168 hours  Recent Labs Lab 09/26/14 2308  AMMONIA 20    CBC:  Recent Labs Lab 09/26/14 1743  WBC 5.3  NEUTROABS 4.3  HGB 13.8  HCT 38.9  MCV 103.7*  PLT 203    Cardiac Enzymes: No results found for this basename: CKTOTAL, CKMB, CKMBINDEX, TROPONINI,  in the last 168 hours  BNP: No components found with this basename: POCBNP,   CBG: No results found for this basename: GLUCAP,  in the last 168 hours  Microbiology: No results found for this or any previous visit.  Coagulation Studies: No results found for this basename:  LABPROT, INR,  in the last 72 hours  Urinalysis:  Recent Labs Lab 09/26/14 2128  COLORURINE AMBER*  LABSPEC 1.020  PHURINE 5.5  GLUCOSEU NEGATIVE  HGBUR NEGATIVE  BILIRUBINUR MODERATE*  KETONESUR 40*  PROTEINUR NEGATIVE  UROBILINOGEN 1.0  NITRITE NEGATIVE  LEUKOCYTESUR TRACE*    Lipid Panel:  No results found for this basename: chol, trig, hdl, cholhdl, vldl, ldlcalc    HgbA1C:  No results found for this basename: HGBA1C    Urine Drug Screen:     Component Value Date/Time   LABOPIA NONE DETECTED 09/26/2014 2128   COCAINSCRNUR NONE DETECTED 09/26/2014 2128   LABBENZ NONE DETECTED 09/26/2014 2128   AMPHETMU NONE DETECTED 09/26/2014 2128   THCU NONE DETECTED 09/26/2014 2128   LABBARB NONE DETECTED 09/26/2014 2128    Alcohol Level:  Recent Labs Lab 09/26/14 Kelly <11    Imaging: Dg Chest 1 View  09/26/2014   CLINICAL DATA:  Patient combative today, acting out and aggressive, which is abnormal behavior. Altered mental status.  EXAM: CHEST - 1 VIEW  COMPARISON:  01/31/2014.  FINDINGS: Cardiac silhouette is normal in size. No mediastinal or hilar masses  or evidence of adenopathy. Lungs are clear. No pleural effusion or pneumothorax.  Bony thorax is intact.  IMPRESSION: No active disease.   Electronically Signed   By: Lajean Manes M.D.   On: 09/26/2014 19:16   Ct Head Wo Contrast  09/26/2014   CLINICAL DATA:  Altered mental status. History of head and neck cancer, radiation therapy May 2015.  EXAM: CT HEAD WITHOUT CONTRAST  TECHNIQUE: Contiguous axial images were obtained from the base of the skull through the vertex without intravenous contrast.  COMPARISON:  CT of the head and cervical spine July 31, 2014  FINDINGS: The ventricles and sulci are normal. No intraparenchymal hemorrhage, mass effect nor midline shift. No acute large vascular territory infarcts. Mild white matter changes suggest chronic small vessel ischemic disease.  No abnormal extra-axial fluid  collections. Basal cisterns are patent. Extensive dural calcifications.  No skull fracture. The included ocular globes and orbital contents are non-suspicious. Mild chronic RIGHT maxillary sinusitis without paranasal sinus air-fluid levels. The mastoid air cells are well aerated, pneumatized LEFT greater than RIGHT petrous apices. Patient is edentulous.  IMPRESSION: No acute intracranial process.  Mild white matter changes suggest chronic small vessel ischemic disease.   Electronically Signed   By: Elon Alas   On: 09/26/2014 22:22     Assessment/Plan: 63 year old female with a history of bipolar disease on Haldol who has had an altered mental status for the past 2 days that acutely worsened today.  Patient was refusing medications and was transported to the ED for IVC due to medication refusal.  Patient has now had Haldol.  Is much more cooperative and appropriate although lethargic.  Unclear what her baseline is. No focality noted on neurological examination.  Head CT reviewed and shows no acute changes.  Presentation likely psychiatric in etiology.  Recommendations: 1.  Vitals, lab work, neurological examination and imaging unremarkable.  Do not suspect a medical or neurologic etiology for the patient's presentation.  Suspect a psychiatric etiology with patient improved after her Haldol administration.  No further neurologic intervention is recommended at this time.  If further questions arise, please call or page at that time.  Thank you for allowing neurology to participate in the care of this patient.  Case discussed with Dr. Markus Daft, MD [facility] [phone] 09/27/2014, 2:51 AM

## 2014-09-27 NOTE — ED Notes (Signed)
[facility] advised someone will be coming to ED to pick up pt when finishes w/counseling sessions.

## 2014-09-27 NOTE — ED Notes (Signed)
Pt's daughter advised she will come pick up pt w/in the hour Lorraine Tapia [phone].

## 2014-10-17 ENCOUNTER — Telehealth: Payer: Self-pay | Admitting: Emergency Medicine

## 2014-10-17 NOTE — Telephone Encounter (Signed)
-----   Message from Tresa Garter, MD sent at 10/09/2014  2:47 PM EST ----- Please inform patient that her chest x-ray shows no active disease.

## 2014-10-17 NOTE — Telephone Encounter (Signed)
Left message for pt to call for xray results

## 2014-11-12 ENCOUNTER — Emergency Department (HOSPITAL_COMMUNITY)
Admission: EM | Admit: 2014-11-12 | Discharge: 2014-11-13 | Disposition: A | Discharge status: Home / Self Care | Payer: Medicaid Other | Attending: Emergency Medicine | Admitting: Emergency Medicine

## 2014-11-12 ENCOUNTER — Encounter (HOSPITAL_COMMUNITY): Payer: Self-pay | Admitting: Emergency Medicine

## 2014-11-12 ENCOUNTER — Emergency Department (HOSPITAL_COMMUNITY): Payer: Medicaid Other

## 2014-11-12 DIAGNOSIS — Y92193 Bedroom in other specified residential institution as the place of occurrence of the external cause: Secondary | ICD-10-CM | POA: Diagnosis not present

## 2014-11-12 DIAGNOSIS — Z720 Tobacco use: Secondary | ICD-10-CM | POA: Diagnosis not present

## 2014-11-12 DIAGNOSIS — Z85819 Personal history of malignant neoplasm of unspecified site of lip, oral cavity, and pharynx: Secondary | ICD-10-CM | POA: Insufficient documentation

## 2014-11-12 DIAGNOSIS — Z043 Encounter for examination and observation following other accident: Secondary | ICD-10-CM | POA: Insufficient documentation

## 2014-11-12 DIAGNOSIS — Y9389 Activity, other specified: Secondary | ICD-10-CM | POA: Insufficient documentation

## 2014-11-12 DIAGNOSIS — Z79899 Other long term (current) drug therapy: Secondary | ICD-10-CM | POA: Diagnosis not present

## 2014-11-12 DIAGNOSIS — Y998 Other external cause status: Secondary | ICD-10-CM | POA: Diagnosis not present

## 2014-11-12 DIAGNOSIS — W06XXXA Fall from bed, initial encounter: Secondary | ICD-10-CM | POA: Diagnosis not present

## 2014-11-12 DIAGNOSIS — F10120 Alcohol abuse with intoxication, uncomplicated: Secondary | ICD-10-CM | POA: Diagnosis not present

## 2014-11-12 DIAGNOSIS — F99 Mental disorder, not otherwise specified: Secondary | ICD-10-CM | POA: Diagnosis not present

## 2014-11-12 DIAGNOSIS — F10920 Alcohol use, unspecified with intoxication, uncomplicated: Secondary | ICD-10-CM

## 2014-11-12 DIAGNOSIS — T1490XA Injury, unspecified, initial encounter: Secondary | ICD-10-CM

## 2014-11-12 DIAGNOSIS — Z923 Personal history of irradiation: Secondary | ICD-10-CM | POA: Insufficient documentation

## 2014-11-12 DIAGNOSIS — F10129 Alcohol abuse with intoxication, unspecified: Secondary | ICD-10-CM | POA: Diagnosis present

## 2014-11-12 LAB — CBC
HEMATOCRIT: 34.3 % — AB (ref 36.0–46.0)
HEMOGLOBIN: 11.8 g/dL — AB (ref 12.0–15.0)
MCH: 36.3 pg — ABNORMAL HIGH (ref 26.0–34.0)
MCHC: 34.4 g/dL (ref 30.0–36.0)
MCV: 105.5 fL — ABNORMAL HIGH (ref 78.0–100.0)
Platelets: 190 10*3/uL (ref 150–400)
RBC: 3.25 MIL/uL — AB (ref 3.87–5.11)
RDW: 13.7 % (ref 11.5–15.5)
WBC: 4.8 10*3/uL (ref 4.0–10.5)

## 2014-11-12 LAB — COMPREHENSIVE METABOLIC PANEL
ALT: 10 U/L (ref 0–35)
AST: 14 U/L (ref 0–37)
Albumin: 3.3 g/dL — ABNORMAL LOW (ref 3.5–5.2)
Alkaline Phosphatase: 54 U/L (ref 39–117)
Anion gap: 14 (ref 5–15)
BILIRUBIN TOTAL: 0.3 mg/dL (ref 0.3–1.2)
BUN: 6 mg/dL (ref 6–23)
CO2: 23 meq/L (ref 19–32)
Calcium: 9.2 mg/dL (ref 8.4–10.5)
Chloride: 99 mEq/L (ref 96–112)
Creatinine, Ser: 0.6 mg/dL (ref 0.50–1.10)
GFR calc Af Amer: >90 mL/min (ref >90)
GLUCOSE: 93 mg/dL (ref 70–99)
POTASSIUM: 3.5 meq/L — AB (ref 3.7–5.3)
SODIUM: 136 meq/L — AB (ref 137–147)
Total Protein: 7.2 g/dL (ref 6.0–8.3)

## 2014-11-12 LAB — RAPID URINE DRUG SCREEN, HOSP PERFORMED
Amphetamines: NOT DETECTED
Barbiturates: NOT DETECTED
Benzodiazepines: NOT DETECTED
COCAINE: NOT DETECTED
Opiates: NOT DETECTED
TETRAHYDROCANNABINOL: NOT DETECTED

## 2014-11-12 LAB — ETHANOL: Alcohol, Ethyl (B): 306 mg/dL — ABNORMAL HIGH (ref 0–11)

## 2014-11-12 NOTE — ED Notes (Signed)
Per roommate, chronic ETOH. Was found unconscious in the bed, slid off bed onto floor. Roommate states she did not hit her head. Alert to verbal, answering some questions.

## 2014-11-12 NOTE — ED Notes (Signed)
Bed: UW72 Expected date: 11/12/14 Expected time: 7:37 PM Means of arrival: Ambulance Comments: Etoh, lethargic

## 2014-11-12 NOTE — ED Provider Notes (Signed)
CSN: [id number]     Arrival date & time 11/12/14  1948 History   First MD Initiated Contact with Patient 11/12/14 2032     Chief Complaint  Patient presents with  . Alcohol Intoxication     (Consider location/radiation/quality/duration/timing/severity/associated sxs/prior Treatment) HPI Comments: Patient here after being found by her roommate on the floor. Patient admits to alcohol this evening and does use alcohol or daily basis. States that she slid out of her bed. Unknown loss of consciousness. Denies any neck or headache at this time. Denies any chest or abdominal pain. Patient is intoxicated at this time and no further history obtainable. EMS was called and patient transported here  Patient is a 63 y.o. female presenting with intoxication. The history is provided by the patient. The history is limited by the condition of the patient.  Alcohol Intoxication    Past Medical History  Diagnosis Date  . Psychiatric disorder   . Depression   . Bipolar disorder   . Shortness of breath     with exertion  . 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 Hx of radiation therapy 02/21/14-04/06/14    right buccal 66y/43fx   Past Surgical History  Procedure Laterality Date  . Right buccal mucosa composite resection  10/19/2013    S/P right buccal mucosa composite resection with flap reconstruction and right modified neck dissection on 10/20/2039 by Dr. Fredricka Bonine at [facility]  . Multiple extractions with alveoloplasty N/A 02/01/2014    Procedure: Extraction of tooth #'s 6,7,8,10,11,12,15,16,22,23,25,26,27 with alveoloplasty.;  Surgeon: Lenn Cal, DDS;  Location: [facility];  Service: Oral Surgery;  Laterality: N/A;  ANESTHESIA: GENERAL WITH NASAL TUBE   Family History  Problem Relation Age of Onset  . Cancer Mother   . Hypertension Father    History  Substance Use Topics  . Smoking status: Current Every Day Smoker -- 0.50 packs/day for 49 years    Types: Cigarettes  .  Smokeless tobacco: Never Used  . Alcohol Use: 1.2 oz/week    2 Cans of beer per week     Comment:  2 24  oz beer occasionally   OB History    No data available     Review of Systems  Unable to perform ROS     Allergies  Other  Home Medications   Prior to Admission medications   Medication Sig Start Date End Date Taking? Authorizing Provider  haloperidol decanoate (HALDOL DECANOATE) 50 MG/ML injection Inject 200 mg into the muscle every 30 (thirty) days. Last dose 01/26/14    Historical Provider, MD   BP 119/62 mmHg  Pulse 61  Temp(Src) 98.3 F (36.8 C) (Oral)  Resp 18  SpO2 99% Physical Exam  Constitutional: She is oriented to person, place, and time. She appears well-developed and well-nourished. She appears lethargic.  Non-toxic appearance. No distress.  HENT:  Head: Normocephalic and atraumatic.  Eyes: Conjunctivae, EOM and lids are normal. Pupils are equal, round, and reactive to light.  Neck: Normal range of motion. Neck supple. No tracheal deviation present. No thyroid mass present.  Cardiovascular: Normal rate, regular rhythm and normal heart sounds.  Exam reveals no gallop.   No murmur heard. Pulmonary/Chest: Effort normal and breath sounds normal. No stridor. No respiratory distress. She has no decreased breath sounds. She has no wheezes. She has no rhonchi. She has no rales.  Abdominal: Soft. Normal appearance and bowel sounds are normal. She exhibits no distension. There is no tenderness. There is no  rebound and no CVA tenderness.  Musculoskeletal: Normal range of motion. She exhibits no edema or tenderness.  Neurological: She is oriented to person, place, and time. She has normal strength. She appears lethargic. No cranial nerve deficit or sensory deficit. GCS eye subscore is 4. GCS verbal subscore is 5. GCS motor subscore is 6.  Skin: Skin is warm and dry. No abrasion and no rash noted.  Psychiatric: Her affect is blunt. Her speech is delayed. She is slowed.   Nursing note and vitals reviewed.   ED Course  Procedures (including critical care time) Labs Review Labs Reviewed  CBC  COMPREHENSIVE METABOLIC PANEL  ETHANOL  URINE RAPID DRUG SCREEN (HOSP PERFORMED)    Imaging Review No results found.   EKG Interpretation None      MDM   Final diagnoses:  None    10:46 PM Pt rechecked and arousable at this time--will monitor until clinically sober    Leota Jacobsen, MD 11/12/14 2247

## 2014-11-12 NOTE — ED Notes (Signed)
Pt lives at  [address].

## 2014-11-13 NOTE — ED Notes (Signed)
Resting quietly with eye closed. Easily arousable. Verbally responsive. Resp even and unlabored. ABC's intact. NAD noted.  

## 2014-11-13 NOTE — Discharge Instructions (Signed)
Alcohol Intoxication  Alcohol intoxication occurs when the amount of alcohol that a person has consumed impairs his or her ability to mentally and physically function. Alcohol directly impairs the normal chemical activity of the brain. Drinking large amounts of alcohol can lead to changes in mental function and behavior, and it can cause many physical effects that can be harmful.   Alcohol intoxication can range in severity from mild to very severe. Various factors can affect the level of intoxication that occurs, such as the person's age, gender, weight, frequency of alcohol consumption, and the presence of other medical conditions (such as diabetes, seizures, or heart conditions). Dangerous levels of alcohol intoxication may occur when people drink large amounts of alcohol in a short period (binge drinking). Alcohol can also be especially dangerous when combined with certain prescription medicines or "recreational" drugs.  SIGNS AND SYMPTOMS  Some common signs and symptoms of mild alcohol intoxication include:  · Loss of coordination.  · Changes in mood and behavior.  · Impaired judgment.  · Slurred speech.  As alcohol intoxication progresses to more severe levels, other signs and symptoms will appear. These may include:  · Vomiting.  · Confusion and impaired memory.  · Slowed breathing.  · Seizures.  · Loss of consciousness.  DIAGNOSIS   Your health care provider will take a medical history and perform a physical exam. You will be asked about the amount and type of alcohol you have consumed. Blood tests will be done to measure the concentration of alcohol in your blood. In many places, your blood alcohol level must be lower than 80 mg/dL (0.08%) to legally drive. However, many dangerous effects of alcohol can occur at much lower levels.   TREATMENT   People with alcohol intoxication often do not require treatment. Most of the effects of alcohol intoxication are temporary, and they go away as the alcohol naturally  leaves the body. Your health care provider will monitor your condition until you are stable enough to go home. Fluids are sometimes given through an IV access tube to help prevent dehydration.   HOME CARE INSTRUCTIONS  · Do not drive after drinking alcohol.  · Stay hydrated. Drink enough water and fluids to keep your urine clear or pale yellow. Avoid caffeine.    · Only take over-the-counter or prescription medicines as directed by your health care provider.    SEEK MEDICAL CARE IF:   · You have persistent vomiting.    · You do not feel better after a few days.  · You have frequent alcohol intoxication. Your health care provider can help determine if you should see a substance use treatment counselor.  SEEK IMMEDIATE MEDICAL CARE IF:   · You become shaky or tremble when you try to stop drinking.    · You shake uncontrollably (seizure).    · You throw up (vomit) blood. This may be bright red or may look like black coffee grounds.    · You have blood in your stool. This may be bright red or may appear as a black, tarry, bad smelling stool.    · You become lightheaded or faint.    MAKE SURE YOU:   · Understand these instructions.  · Will watch your condition.  · Will get help right away if you are not doing well or get worse.  Document Released: 08/28/2005 Document Revised: 07/21/2013 Document Reviewed: 04/23/2013  ExitCare® Patient Information ©2015 [employer]. This information is not intended to replace advice given to you by your health care provider. Make sure   you discuss any questions you have with your health care provider.

## 2014-11-13 NOTE — ED Provider Notes (Signed)
Patient is now awake and alert. Ambulating in the emergency department without any difficulty. Clinically the patient is sober. We'll discharge home with term precautions.  Lorraine Rice, MD 11/13/14 [phone]

## 2014-11-13 NOTE — ED Notes (Addendum)
Pt has ripped off monitor leads.  Other vitals stable. Pt sleeping.

## 2014-12-08 ENCOUNTER — Ambulatory Visit: Admission: RE | Admit: 2014-12-08 | Payer: Medicaid Other | Source: Ambulatory Visit | Admitting: Radiation Oncology

## 2015-02-28 ENCOUNTER — Ambulatory Visit: Payer: Self-pay | Admitting: Family Medicine

## 2015-03-09 ENCOUNTER — Ambulatory Visit: Payer: Self-pay | Admitting: Family Medicine

## 2015-03-24 ENCOUNTER — Ambulatory Visit: Payer: Self-pay | Admitting: Family Medicine

## 2015-03-24 ENCOUNTER — Ambulatory Visit: Payer: Medicaid Other | Attending: Family Medicine | Admitting: Family Medicine

## 2015-03-24 ENCOUNTER — Encounter: Payer: Self-pay | Admitting: Family Medicine

## 2015-03-24 VITALS — BP 145/76 | HR 65 | Temp 97.7°F | Resp 16 | Ht 71.0 in | Wt 156.0 lb

## 2015-03-24 DIAGNOSIS — F1010 Alcohol abuse, uncomplicated: Secondary | ICD-10-CM

## 2015-03-24 DIAGNOSIS — F17200 Nicotine dependence, unspecified, uncomplicated: Secondary | ICD-10-CM | POA: Diagnosis not present

## 2015-03-24 DIAGNOSIS — R609 Edema, unspecified: Secondary | ICD-10-CM | POA: Insufficient documentation

## 2015-03-24 DIAGNOSIS — E559 Vitamin D deficiency, unspecified: Secondary | ICD-10-CM | POA: Diagnosis not present

## 2015-03-24 DIAGNOSIS — F319 Bipolar disorder, unspecified: Secondary | ICD-10-CM | POA: Diagnosis not present

## 2015-03-24 DIAGNOSIS — L814 Other melanin hyperpigmentation: Secondary | ICD-10-CM | POA: Insufficient documentation

## 2015-03-24 DIAGNOSIS — F1099 Alcohol use, unspecified with unspecified alcohol-induced disorder: Secondary | ICD-10-CM | POA: Diagnosis not present

## 2015-03-24 DIAGNOSIS — Z114 Encounter for screening for human immunodeficiency virus [HIV]: Secondary | ICD-10-CM | POA: Diagnosis not present

## 2015-03-24 DIAGNOSIS — L599 Disorder of the skin and subcutaneous tissue related to radiation, unspecified: Secondary | ICD-10-CM | POA: Diagnosis not present

## 2015-03-24 DIAGNOSIS — Z0000 Encounter for general adult medical examination without abnormal findings: Secondary | ICD-10-CM | POA: Diagnosis not present

## 2015-03-24 DIAGNOSIS — I10 Essential (primary) hypertension: Secondary | ICD-10-CM | POA: Insufficient documentation

## 2015-03-24 DIAGNOSIS — C060 Malignant neoplasm of cheek mucosa: Secondary | ICD-10-CM | POA: Insufficient documentation

## 2015-03-24 DIAGNOSIS — Z923 Personal history of irradiation: Secondary | ICD-10-CM | POA: Insufficient documentation

## 2015-03-24 DIAGNOSIS — R718 Other abnormality of red blood cells: Secondary | ICD-10-CM

## 2015-03-24 LAB — CBC
HCT: 35.6 % — ABNORMAL LOW (ref 36.0–46.0)
HEMOGLOBIN: 11.7 g/dL — AB (ref 12.0–15.0)
MCH: 35.7 pg — AB (ref 26.0–34.0)
MCHC: 32.9 g/dL (ref 30.0–36.0)
MCV: 108.5 fL — ABNORMAL HIGH (ref 78.0–100.0)
MPV: 10.2 fL (ref 8.6–12.4)
Platelets: 210 10*3/uL (ref 150–400)
RBC: 3.28 MIL/uL — ABNORMAL LOW (ref 3.87–5.11)
RDW: 13.3 % (ref 11.5–15.5)
WBC: 3.9 10*3/uL — ABNORMAL LOW (ref 4.0–10.5)

## 2015-03-24 MED ORDER — CLONIDINE HCL 0.1 MG PO TABS
0.1000 mg | ORAL_TABLET | Freq: Once | ORAL | Status: AC
  Administered 2015-03-24: 0.1 mg via ORAL

## 2015-03-24 MED ORDER — CHLORTHALIDONE 25 MG PO TABS: 25.0000 mg | ORAL_TABLET | Freq: Every day | ORAL | Status: DC

## 2015-03-24 NOTE — Assessment & Plan Note (Signed)
Screening done today.  

## 2015-03-24 NOTE — Assessment & Plan Note (Addendum)
SUP:[id number]  Goal BP is < 150/90 at all times Start chlorthalidone 25 mg daily in AM  Cut down on salt intake   Lipitor 20 mg daily

## 2015-03-24 NOTE — Assessment & Plan Note (Signed)
Mammogram ordered GI referral for screening colonoscopy placed Pap smear at f/u appt

## 2015-03-24 NOTE — Assessment & Plan Note (Signed)
A: has swelling in face. No pain. Risk factors remain, smoking, alcohol. Missed last med onc appt. P: Advised patient to f/u with Dr. Lisbeth Renshaw, phone # provided.  Advised smoking cessation Advised ETOH limit of 2 servings

## 2015-03-24 NOTE — Progress Notes (Signed)
   Subjective:    Patient ID: Lorraine Tapia, female    DOB: 1951-07-05, 64 y.o.   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   Patient has been lost to f/u for some time,  1. HTN: no medications. Eats a high salt diet. Smokes.   2. 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 Skin color is lightening back to normal post radiation. Swelling has been the same. No oral pain. Still smokes, still drinks alcohol. Missed last f/u appt scheduled in 12/2014 with Dr. Lisbeth Renshaw.  3. Bipolar disorder: on haldol injections. Mood is stable. 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 Case Freight forwarder. Case manger request a clinical summary along with labs be faxed to [phone] once available.   4. Healthcare maintenance: due for pap, mammogram, screening HIV, Tdap, zostavax, colonoscopy.   Soc Hx: smoker, ETOH 2-3 servings per day  Review of Systems  Constitutional: Negative for fever, chills and unexpected weight change.  Eyes: Negative for visual disturbance.  Respiratory: Negative for cough and shortness of breath.   Cardiovascular: Negative for chest pain, palpitations and leg swelling.  Neurological: Negative for headaches.      Objective:   Physical Exam BP 145/76 mmHg  Pulse 65  Temp(Src) 97.7 F (36.5 C) (Oral)  Resp 16  Ht 5\' 11"  (1.803 m)  Wt 156 lb (70.761 kg)  BMI 21.77 kg/m2  SpO2 100% General appearance: alert, cooperative and no distress Eyes: conjunctivae/corneas clear. PERRL, EOM's intact. Fundi benign. Throat: swelling in R buccal and R jaw with hyperpigmentation of skin. edentulous. shallow crevice in R buccal mucosa w/o friability. Edentulous. No oral lesions. Tongue normal Neck: no adenopathy, supple, symmetrical, trachea midline and thyroid not enlarged, symmetric, no tenderness/mass/nodules Lungs: clear to auscultation bilaterally Heart: regular rate and rhythm, S1,  S2 normal, no murmur, click, rub or gallop Abdomen: soft, non-tender; bowel sounds normal; no masses,  no organomegaly Extremities: extremities normal, atraumatic, no cyanosis or edema  BP initially elevated patient treated with clonidine 0.1 mg PO x one in office         Assessment & Plan:

## 2015-03-24 NOTE — Progress Notes (Signed)
Establish Care Growth mouth  Hx Cancer

## 2015-03-24 NOTE — Patient Instructions (Signed)
Ms. Bundrick,  Thank you for coming in today. It was a pleasure meeting you. I look forward to being your primary doctor.  1. HTN: Goal BP is < 150/90 at all times Take your blood pressure medicine daily in the morning Cut down on salt intake   Mammogram ordered I will send a clinical summary once lab results are in  Call for f/u with Dr. Lisbeth Renshaw, radiation oncology, [phone]  F/u in 6 weeks for pap smear  Dr. Adrian Blackwater   DASH Eating Plan DASH stands for "Dietary Approaches to Stop Hypertension." The DASH eating plan is a healthy eating plan that has been shown to reduce high blood pressure (hypertension). Additional health benefits may include reducing the risk of type 2 diabetes mellitus, heart disease, and stroke. The DASH eating plan may also help with weight loss. WHAT DO I NEED TO KNOW ABOUT THE DASH EATING PLAN? For the DASH eating plan, you will follow these general guidelines:  Choose foods with a percent daily value for sodium of less than 5% (as listed on the food label).  Use salt-free seasonings or herbs instead of table salt or sea salt.  Check with your health care provider or pharmacist before using salt substitutes.  Eat lower-sodium products, often labeled as "lower sodium" or "no salt added."  Eat fresh foods.  Eat more vegetables, fruits, and low-fat dairy products.  Choose whole grains. Look for the word "whole" as the first word in the ingredient list.  Choose fish and skinless chicken or [place] more often than red meat. Limit fish, poultry, and meat to 6 oz (170 g) each day.  Limit sweets, desserts, sugars, and sugary drinks.  Choose heart-healthy fats.  Limit cheese to 1 oz (28 g) per day.  Eat more home-cooked food and less restaurant, buffet, and fast food.  Limit fried foods.  Cook foods using methods other than frying.  Limit canned vegetables. If you do use them, rinse them well to decrease the sodium.  When eating at a restaurant, ask  that your food be prepared with less salt, or no salt if possible. WHAT FOODS CAN I EAT? Seek help from a dietitian for individual calorie needs. Grains Whole grain or whole wheat bread. Brown rice. Whole grain or whole wheat pasta. Quinoa, bulgur, and whole grain cereals. Low-sodium cereals. Corn or whole wheat flour tortillas. Whole grain cornbread. Whole grain crackers. Low-sodium crackers. Vegetables Fresh or frozen vegetables (raw, steamed, roasted, or grilled). Low-sodium or reduced-sodium tomato and vegetable juices. Low-sodium or reduced-sodium tomato sauce and paste. Low-sodium or reduced-sodium canned vegetables.  Fruits All fresh, canned (in natural juice), or frozen fruits. Meat and Other Protein Products Ground beef (85% or leaner), grass-fed beef, or beef trimmed of fat. Skinless chicken or [place]. Ground chicken or [place]. Pork trimmed of fat. All fish and seafood. Eggs. Dried beans, peas, or lentils. Unsalted nuts and seeds. Unsalted canned beans. Dairy Low-fat dairy products, such as skim or 1% milk, 2% or reduced-fat cheeses, low-fat ricotta or cottage cheese, or plain low-fat yogurt. Low-sodium or reduced-sodium cheeses. Fats and Oils Tub margarines without trans fats. Light or reduced-fat mayonnaise and salad dressings (reduced sodium). Avocado. Safflower, olive, or canola oils. Natural peanut or almond butter. Other Unsalted popcorn and pretzels. The items listed above may not be a complete list of recommended foods or beverages. Contact your dietitian for more options. WHAT FOODS ARE NOT RECOMMENDED? Grains White bread. White pasta. White rice. Refined cornbread. Bagels and croissants. Crackers that contain  trans fat. Vegetables Creamed or fried vegetables. Vegetables in a cheese sauce. Regular canned vegetables. Regular canned tomato sauce and paste. Regular tomato and vegetable juices. Fruits Dried fruits. Canned fruit in light or heavy syrup. Fruit juice. Meat and  Other Protein Products Fatty cuts of meat. Ribs, chicken wings, bacon, sausage, bologna, salami, chitterlings, fatback, hot dogs, bratwurst, and packaged luncheon meats. Salted nuts and seeds. Canned beans with salt. Dairy Whole or 2% milk, cream, half-and-half, and cream cheese. Whole-fat or sweetened yogurt. Full-fat cheeses or blue cheese. Nondairy creamers and whipped toppings. Processed cheese, cheese spreads, or cheese curds. Condiments Onion and garlic salt, seasoned salt, table salt, and sea salt. Canned and packaged gravies. Worcestershire sauce. Tartar sauce. Barbecue sauce. Teriyaki sauce. Soy sauce, including reduced sodium. Steak sauce. Fish sauce. Oyster sauce. Cocktail sauce. Horseradish. Ketchup and mustard. Meat flavorings and tenderizers. Bouillon cubes. Hot sauce. Tabasco sauce. Marinades. Taco seasonings. Relishes. Fats and Oils Butter, stick margarine, lard, shortening, ghee, and bacon fat. Coconut, palm kernel, or palm oils. Regular salad dressings. Other Pickles and olives. Salted popcorn and pretzels. The items listed above may not be a complete list of foods and beverages to avoid. Contact your dietitian for more information. WHERE CAN I FIND MORE INFORMATION? National Heart, Lung, and Blood Institute: travelstabloid.com Document Released: 11/07/2011 Document Revised: 04/04/2014 Document Reviewed: 09/22/2013 [employer] Patient Information 2015 [place], [place]. This information is not intended to replace advice given to you by your health care provider. Make sure you discuss any questions you have with your health care provider.

## 2015-03-25 LAB — COMPLETE METABOLIC PANEL WITH GFR
ALT: 10 U/L (ref 0–35)
AST: 15 U/L (ref 0–37)
Albumin: 3.8 g/dL (ref 3.5–5.2)
Alkaline Phosphatase: 49 U/L (ref 39–117)
BUN: 9 mg/dL (ref 6–23)
CHLORIDE: 102 meq/L (ref 96–112)
CO2: 25 meq/L (ref 19–32)
CREATININE: 0.59 mg/dL (ref 0.50–1.10)
Calcium: 9.5 mg/dL (ref 8.4–10.5)
GFR, Est Non African American: >89 mL/min
Glucose, Bld: 106 mg/dL — ABNORMAL HIGH (ref 70–99)
Potassium: 5 mEq/L (ref 3.5–5.3)
SODIUM: 138 meq/L (ref 135–145)
TOTAL PROTEIN: 6.9 g/dL (ref 6.0–8.3)
Total Bilirubin: 1.1 mg/dL (ref 0.2–1.2)

## 2015-03-25 LAB — LIPID PANEL
CHOLESTEROL: 139 mg/dL (ref 0–200)
HDL: 59 mg/dL (ref >=46)
LDL Cholesterol: 66 mg/dL (ref 0–99)
TRIGLYCERIDES: 70 mg/dL (ref <150)
Total CHOL/HDL Ratio: 2.4 Ratio
VLDL: 14 mg/dL (ref 0–40)

## 2015-03-25 LAB — HIV ANTIBODY (ROUTINE TESTING W REFLEX): HIV 1&2 Ab, 4th Generation: NONREACTIVE

## 2015-03-25 LAB — VITAMIN D 25 HYDROXY (VIT D DEFICIENCY, FRACTURES): VIT D 25 HYDROXY: 10 ng/mL — AB (ref 30–100)

## 2015-03-27 ENCOUNTER — Telehealth: Payer: Self-pay | Admitting: *Deleted

## 2015-03-27 DIAGNOSIS — E559 Vitamin D deficiency, unspecified: Secondary | ICD-10-CM | POA: Insufficient documentation

## 2015-03-27 DIAGNOSIS — R718 Other abnormality of red blood cells: Secondary | ICD-10-CM | POA: Insufficient documentation

## 2015-03-27 MED ORDER — VITAMIN D (ERGOCALCIFEROL) 1.25 MG (50000 UNIT) PO CAPS: 50000.0000 [IU] | ORAL_CAPSULE | ORAL | Status: DC

## 2015-03-27 MED ORDER — FERROUS SULFATE 325 (65 FE) MG PO TABS: 325.0000 mg | ORAL_TABLET | Freq: Every day | ORAL | Status: DC

## 2015-03-27 MED ORDER — ATORVASTATIN CALCIUM 20 MG PO TABS: 20.0000 mg | ORAL_TABLET | Freq: Every day | ORAL | Status: DC

## 2015-03-27 NOTE — Assessment & Plan Note (Signed)
A: vit D def P: 50 K U weekly for 12 weeks

## 2015-03-27 NOTE — Assessment & Plan Note (Signed)
A: microcytosis without significant anemia P:  Iron supplement once daily

## 2015-03-27 NOTE — Addendum Note (Signed)
Addended by: Boykin Nearing on: 03/27/2015 09:21 AM   Modules accepted: Orders

## 2015-03-27 NOTE — Telephone Encounter (Signed)
Lorraine Tapia, Pharmacist at [facility] at [facility], called asking for okay to fill  ferrous sulfate 324 mg with 65 mg elemental iron with enteric coating tabs Okay given per PCP

## 2015-04-04 ENCOUNTER — Telehealth: Payer: Self-pay | Admitting: *Deleted

## 2015-04-04 NOTE — Telephone Encounter (Signed)
-----   Message from Boykin Nearing, MD sent at 03/27/2015  9:15 AM EDT ----- Vit D def 50 K U weekly for 12 weeks  Slight anemia with microcytosis suggesting iron def Smoker, HTN, lipitor 20 mg daily

## 2015-04-04 NOTE — Telephone Encounter (Signed)
Pt aware of results Notified Rx at [place] [facility]

## 2016-02-04 ENCOUNTER — Emergency Department (HOSPITAL_COMMUNITY): Payer: Medicaid Other

## 2016-02-04 ENCOUNTER — Encounter (HOSPITAL_COMMUNITY): Payer: Self-pay | Admitting: Emergency Medicine

## 2016-02-04 ENCOUNTER — Inpatient Hospital Stay (HOSPITAL_COMMUNITY)
Admission: EM | Admit: 2016-02-04 | Discharge: 2016-02-07 | DRG: 896 | Disposition: A | Discharge status: Home Health | Payer: Medicaid Other | Attending: Internal Medicine | Admitting: Internal Medicine

## 2016-02-04 DIAGNOSIS — J9600 Acute respiratory failure, unspecified whether with hypoxia or hypercapnia: Secondary | ICD-10-CM

## 2016-02-04 DIAGNOSIS — D329 Benign neoplasm of meninges, unspecified: Secondary | ICD-10-CM | POA: Diagnosis present

## 2016-02-04 DIAGNOSIS — R569 Unspecified convulsions: Secondary | ICD-10-CM

## 2016-02-04 DIAGNOSIS — Z85818 Personal history of malignant neoplasm of other sites of lip, oral cavity, and pharynx: Secondary | ICD-10-CM

## 2016-02-04 DIAGNOSIS — F10239 Alcohol dependence with withdrawal, unspecified: Principal | ICD-10-CM | POA: Diagnosis present

## 2016-02-04 DIAGNOSIS — G8384 Todd's paralysis (postepileptic): Secondary | ICD-10-CM | POA: Diagnosis present

## 2016-02-04 DIAGNOSIS — F17210 Nicotine dependence, cigarettes, uncomplicated: Secondary | ICD-10-CM | POA: Diagnosis present

## 2016-02-04 DIAGNOSIS — H5500 Unspecified nystagmus: Secondary | ICD-10-CM | POA: Diagnosis present

## 2016-02-04 DIAGNOSIS — F319 Bipolar disorder, unspecified: Secondary | ICD-10-CM | POA: Diagnosis present

## 2016-02-04 DIAGNOSIS — D509 Iron deficiency anemia, unspecified: Secondary | ICD-10-CM | POA: Diagnosis present

## 2016-02-04 DIAGNOSIS — N179 Acute kidney failure, unspecified: Secondary | ICD-10-CM | POA: Diagnosis present

## 2016-02-04 DIAGNOSIS — Z781 Physical restraint status: Secondary | ICD-10-CM

## 2016-02-04 DIAGNOSIS — G9340 Encephalopathy, unspecified: Secondary | ICD-10-CM | POA: Diagnosis present

## 2016-02-04 DIAGNOSIS — Z23 Encounter for immunization: Secondary | ICD-10-CM

## 2016-02-04 DIAGNOSIS — R531 Weakness: Secondary | ICD-10-CM

## 2016-02-04 DIAGNOSIS — E871 Hypo-osmolality and hyponatremia: Secondary | ICD-10-CM | POA: Diagnosis not present

## 2016-02-04 DIAGNOSIS — F1010 Alcohol abuse, uncomplicated: Secondary | ICD-10-CM | POA: Diagnosis present

## 2016-02-04 DIAGNOSIS — E785 Hyperlipidemia, unspecified: Secondary | ICD-10-CM | POA: Diagnosis present

## 2016-02-04 DIAGNOSIS — Z923 Personal history of irradiation: Secondary | ICD-10-CM

## 2016-02-04 DIAGNOSIS — R4189 Other symptoms and signs involving cognitive functions and awareness: Secondary | ICD-10-CM

## 2016-02-04 DIAGNOSIS — Z0189 Encounter for other specified special examinations: Secondary | ICD-10-CM

## 2016-02-04 LAB — CBC WITH DIFFERENTIAL/PLATELET
Basophils Absolute: 0 10*3/uL (ref 0.0–0.1)
Basophils Relative: 0 %
EOS ABS: 0 10*3/uL (ref 0.0–0.7)
Eosinophils Relative: 0 %
HCT: 35.3 % — ABNORMAL LOW (ref 36.0–46.0)
Hemoglobin: 12.1 g/dL (ref 12.0–15.0)
LYMPHS ABS: 1.4 10*3/uL (ref 0.7–4.0)
LYMPHS PCT: 14 %
MCH: 34.7 pg — AB (ref 26.0–34.0)
MCHC: 34.3 g/dL (ref 30.0–36.0)
MCV: 101.1 fL — AB (ref 78.0–100.0)
MONOS PCT: 6 %
Monocytes Absolute: 0.6 10*3/uL (ref 0.1–1.0)
NEUTROS PCT: 80 %
Neutro Abs: 7.6 10*3/uL (ref 1.7–7.7)
Platelets: 280 10*3/uL (ref 150–400)
RBC: 3.49 MIL/uL — ABNORMAL LOW (ref 3.87–5.11)
RDW: 13.9 % (ref 11.5–15.5)
WBC: 9.6 10*3/uL (ref 4.0–10.5)

## 2016-02-04 LAB — RAPID URINE DRUG SCREEN, HOSP PERFORMED
Amphetamines: NOT DETECTED
BENZODIAZEPINES: NOT DETECTED
Barbiturates: NOT DETECTED
COCAINE: NOT DETECTED
Opiates: NOT DETECTED
Tetrahydrocannabinol: NOT DETECTED

## 2016-02-04 LAB — COMPREHENSIVE METABOLIC PANEL
ALBUMIN: 4.2 g/dL (ref 3.5–5.0)
ALT: 17 U/L (ref 14–54)
ANION GAP: 13 (ref 5–15)
AST: 34 U/L (ref 15–41)
Alkaline Phosphatase: 58 U/L (ref 38–126)
BUN: 11 mg/dL (ref 6–20)
CALCIUM: 10 mg/dL (ref 8.9–10.3)
CO2: 26 mmol/L (ref 22–32)
Chloride: 103 mmol/L (ref 101–111)
Creatinine, Ser: 0.93 mg/dL (ref 0.44–1.00)
GFR calc Af Amer: >60 mL/min (ref >60)
GFR calc non Af Amer: >60 mL/min (ref >60)
GLUCOSE: 165 mg/dL — AB (ref 65–99)
Potassium: 3.7 mmol/L (ref 3.5–5.1)
SODIUM: 142 mmol/L (ref 135–145)
Total Bilirubin: 1.6 mg/dL — ABNORMAL HIGH (ref 0.3–1.2)
Total Protein: 7.8 g/dL (ref 6.5–8.1)

## 2016-02-04 LAB — URINALYSIS, ROUTINE W REFLEX MICROSCOPIC
Glucose, UA: NEGATIVE mg/dL
KETONES UR: NEGATIVE mg/dL
LEUKOCYTES UA: NEGATIVE
NITRITE: NEGATIVE
PH: 5 (ref 5.0–8.0)
PROTEIN: 100 mg/dL — AB
Specific Gravity, Urine: 1.02 (ref 1.005–1.030)

## 2016-02-04 LAB — CBG MONITORING, ED: Glucose-Capillary: 111 mg/dL — ABNORMAL HIGH (ref 65–99)

## 2016-02-04 LAB — ETHANOL: Alcohol, Ethyl (B): <5 mg/dL (ref <5)

## 2016-02-04 LAB — LIPASE, BLOOD: Lipase: 25 U/L (ref 11–51)

## 2016-02-04 LAB — URINE MICROSCOPIC-ADD ON: WBC, UA: NONE SEEN WBC/hpf (ref 0–5)

## 2016-02-04 MED ORDER — PROPOFOL 10 MG/ML IV BOLUS
INTRAVENOUS | Status: AC
Start: 2016-02-04 — End: 2016-02-04
  Administered 2016-02-04: 200 mg
  Filled 2016-02-04: qty 20

## 2016-02-04 MED ORDER — PROPOFOL 1000 MG/100ML IV EMUL
5.0000 ug/kg/min | Freq: Once | INTRAVENOUS | Status: DC
  Administered 2016-02-04: 10 ug/kg/min via INTRAVENOUS

## 2016-02-04 MED ORDER — LIDOCAINE HCL (CARDIAC) 20 MG/ML IV SOLN
INTRAVENOUS | Status: AC | PRN
  Administered 2016-02-04: 100 mg via INTRAVENOUS

## 2016-02-04 MED ORDER — SODIUM CHLORIDE 0.9 % IV SOLN
Freq: Once | INTRAVENOUS | Status: AC
  Administered 2016-02-04: 23:00:00 via INTRAVENOUS

## 2016-02-04 MED ORDER — SODIUM CHLORIDE 0.9 % IV SOLN
INTRAVENOUS | Status: DC
  Administered 2016-02-05 – 2016-02-06 (×3): via INTRAVENOUS

## 2016-02-04 MED ORDER — PROPOFOL 1000 MG/100ML IV EMUL
INTRAVENOUS | Status: AC
  Filled 2016-02-04: qty 100

## 2016-02-04 MED ORDER — LORAZEPAM 2 MG/ML IJ SOLN
INTRAMUSCULAR | Status: AC | PRN
  Administered 2016-02-04: 1 mg via INTRAVENOUS

## 2016-02-04 MED ORDER — PROPOFOL 10 MG/ML IV BOLUS
40.0000 mg | Freq: Once | INTRAVENOUS | Status: AC
  Administered 2016-02-04: 40 mg via INTRAVENOUS
  Filled 2016-02-04: qty 20

## 2016-02-04 MED ORDER — PROPOFOL 10 MG/ML IV BOLUS
40.0000 mg | Freq: Once | INTRAVENOUS | Status: AC
  Administered 2016-02-04: 40 mg via INTRAVENOUS

## 2016-02-04 MED ORDER — LORAZEPAM 2 MG/ML IJ SOLN
1.0000 mg | Freq: Once | INTRAMUSCULAR | Status: DC
  Filled 2016-02-04: qty 1

## 2016-02-04 MED ORDER — SUCCINYLCHOLINE CHLORIDE 20 MG/ML IJ SOLN
INTRAMUSCULAR | Status: AC | PRN
  Administered 2016-02-04: 100 mg via INTRAVENOUS

## 2016-02-04 MED ORDER — NALOXONE HCL 0.4 MG/ML IJ SOLN
0.4000 mg | Freq: Once | INTRAMUSCULAR | Status: AC
  Administered 2016-02-04: 0.4 mg via INTRAVENOUS
  Filled 2016-02-04: qty 1

## 2016-02-04 MED ORDER — ETOMIDATE 2 MG/ML IV SOLN
INTRAVENOUS | Status: AC | PRN
  Administered 2016-02-04: 10 mg via INTRAVENOUS

## 2016-02-04 MED ORDER — IOHEXOL 350 MG/ML SOLN
100.0000 mL | Freq: Once | INTRAVENOUS | Status: AC | PRN
  Administered 2016-02-04: 100 mL via INTRAVENOUS

## 2016-02-04 NOTE — H&P (Signed)
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 EMS was called to her residence for seizure activity. She had a witnessed seizure en route to the hospital. She was noted to have rightward gaze deviation with nystagmus. She was intubated for airway protection and started on propofol. History is obtained from the chart and ED provider. No family is available.   Labs are notable for benign UA, normal glucose, mild AKI (Cr 0.98 from 0.59 10 months ago), Tbili 1.6.  Ct head was relatively unremarkable. CTA head and neck is pending.   PAST MEDICAL HISTORY :  She  has a past medical history of Psychiatric disorder; Depression; Bipolar disorder ([place]); Shortness of breath; Cancer [facility]) (10-19-2013); and radiation therapy (02/21/14-04/06/14).  PAST SURGICAL HISTORY: She  has past surgical history that includes Right buccal mucosa composite resection (10/19/2013) and Multiple extractions with alveoloplasty (N/A, 02/01/2014).  Allergies  Allergen Reactions  . Other Itching    Rash and itching with pepper    No current facility-administered medications on file prior to encounter.   Current Outpatient Prescriptions on File Prior to Encounter  Medication Sig  . atorvastatin (LIPITOR) 20 MG tablet Take 1 tablet (20 mg total) by mouth daily.  . chlorthalidone (HYGROTON) 25 MG tablet Take 1 tablet (25 mg total) by mouth daily.  . ferrous sulfate 325 (65 FE) MG tablet Take 1 tablet (325 mg total) by mouth daily with breakfast.  . haloperidol decanoate (HALDOL DECANOATE) 50 MG/ML injection Inject 200 mg into the muscle every 30 (thirty)  days. Last dose 01/26/14  . Vitamin D, Ergocalciferol, (DRISDOL) 50000 UNITS CAPS capsule Take 1 capsule (50,000 Units total) by mouth every 7 (seven) days. For 12 weeks    FAMILY HISTORY:  Her indicated that her mother is deceased. She indicated that her father is deceased.   SOCIAL HISTORY: She  reports that she has been smoking Cigarettes.  She has a 24.5 pack-year smoking history. She has never used smokeless tobacco. She reports that she drinks about 1.2 oz of alcohol per week. She reports that she does not use illicit drugs.  REVIEW OF SYSTEMS:   Unable to obtain 2/2 unresponsive and intubated  VITAL SIGNS: Temp:  [97.7 F (36.5 C)-99.5 F (37.5 C)] 98.4 F (36.9 C) (03/05 2132) Pulse Rate:  [83-111] 83 (03/05 2346) Resp:  [14-24] 19 (03/05 2346) BP: (105-191)/(71-100) 140/78 mmHg (03/05 2346) SpO2:  [92 %-100 %] 100 % (03/05 2346) FiO2 (%):  [40 %] 40 % (03/05 2346)  HEMODYNAMICS:    VENTILATOR SETTINGS: Vent Mode:  [-] PRVC FiO2 (%):  [40 %] 40 % Set Rate:  [18 bmp] 18 bmp Vt Set:  [530 mL] 530 mL PEEP:  [5 cmH20] 5 cmH20  INTAKE / OUTPUT:    PHYSICAL EXAMINATION: General:  Thin, well-developed AAF, unresponsive, intubated Neuro:  Upward gaze, pupils equal, round, reactive, no spontaneous movements, unresponsive to noxious stimuli (on propofol during exam). HEENT:  Post-surgical / post-radiation changes to face/cheek. Edentulous. ETT/OGT in place. Cardiovascular:  Regular rhythm, normal rate, s1, s2, no m/r/g. Distal pulses palpable.  Lungs:  Coarse bilaterally, but generally clear to auscultation without  wheezes, rales or ronchi. Vent-assisted effort. Symmetrical expansion Abdomen:  Soft, non-distended, +bs, no organomegaly or mass. Unable to assess tenderness Musculoskeletal:  No bony deformities.  Skin:  Dry, intact. No rashes / sores / ulcers.   LABS:  BMET  Recent Labs Lab 02/04/16 2000  NA 142  K 3.7  CL 103  CO2 26  BUN 11  CREATININE 0.93   GLUCOSE 165*    Electrolytes  Recent Labs Lab 02/04/16 2000  CALCIUM 10.0    CBC  Recent Labs Lab 02/04/16 2000  WBC 9.6  HGB 12.1  HCT 35.3*  PLT 280    Coag's No results for input(s): APTT, INR in the last 168 hours.  Sepsis Markers No results for input(s): LATICACIDVEN, PROCALCITON, O2SATVEN in the last 168 hours.  ABG No results for input(s): PHART, PCO2ART, PO2ART in the last 168 hours.  Liver Enzymes  Recent Labs Lab 02/04/16 2000  AST 34  ALT 17  ALKPHOS 58  BILITOT 1.6*  ALBUMIN 4.2    Cardiac Enzymes No results for input(s): TROPONINI, PROBNP in the last 168 hours.  Glucose  Recent Labs Lab 02/04/16 2113  GLUCAP 111*    Imaging Ct Head Wo Contrast  02/04/2016  CLINICAL DATA:  Patient was found unresponsive and having seizures. Possible drug related. EXAM: CT HEAD WITHOUT CONTRAST TECHNIQUE: Contiguous axial images were obtained from the base of the skull through the vertex without intravenous contrast. COMPARISON:  11/12/2014 FINDINGS: Mild cerebral atrophy. Mild patchy low-attenuation change in the deep white matter may indicate small vessel ischemia. Borderline for ventricular dilatation. No mass effect or midline shift. No abnormal extra-axial fluid collections. Gray-white matter junctions are distinct. Basal cisterns are not effaced. No evidence of acute intracranial hemorrhage. No depressed skull fractures. Visualized paranasal sinuses and mastoid air cells are not opacified. IMPRESSION: No acute intracranial abnormalities. Mild chronic atrophy and small vessel ischemic changes suggested. Electronically Signed   By: Lucienne Capers M.D.   On: 02/04/2016 22:32   Dg Chest Port 1 View  02/04/2016  CLINICAL DATA:  Endotracheal tube adjustment. EXAM: PORTABLE CHEST 1 VIEW COMPARISON:  Earlier this day. FINDINGS: Endotracheal tube is now visualized 7.9 cm from the carina. Previous asymmetric aeration of the lungs is less well visualized on the  current exam. Cardiomediastinal contours are normal. No pulmonary edema. Vague increased density in the right suprahilar lung. IMPRESSION: 1. Endotracheal tube 7.9 cm from the carina. 2. Decreasing asymmetric lung aeration. 3. Vague density in the right suprahilar lung, may simply represent overlapping osseous structures. When patient is able, recommend PA and lateral views. Electronically Signed   By: Jeb Levering M.D.   On: 02/04/2016 23:01   Dg Chest Portable 1 View  02/04/2016  CLINICAL DATA:  Shortness of breath.  Intubation. EXAM: PORTABLE CHEST 1 VIEW COMPARISON:  09/26/2014 FINDINGS: Endotracheal tube is not visualized. There is asymmetric lung aeration, right increased compared to the left with relative lucency of the right lung. No significant rotation is seen. Heart size and mediastinal contours are normal. No evidence of pulmonary edema, confluent airspace disease or large pleural effusion. IMPRESSION: 1. Endotracheal tube not visualized. Tube may be above the thoracic inlet not included in the field of view, however recommend repositioning and repeat exam. 2. Asymmetric lung aeration, with the right lung hyperaerated and with decreased density compared to the left. Preferential right mainstem intubation with subsequent retraction could produce this appearance. An anterior pneumothorax is not excluded. Continued follow-up is recommended. These results were  called by telephone at the time of interpretation on 02/04/2016 at 9:41 pm to Dr. Fredia Sorrow , who verbally acknowledged these results. Patient is reportedly ventilating with normal oxygen saturation and equal breath sounds. Electronically Signed   By: Jeb Levering M.D.   On: 02/04/2016 21:44     STUDIES:  Imaging as above.   CULTURES: None  ANTIBIOTICS: None  SIGNIFICANT EVENTS: Change in gaze preference; concern for stroke -> CTA head/neck  LINES/TUBES: PIV ETT 3/5 >> Foley 3/5>>  DISCUSSION: Ms. Manzoor is a 77F with  reported history of alcohol abuse and psychiatric disorders who was found seizing earlier this evening. The etiology of her seizures is unclear. Her alcohol level was nil, raising the question of EtOH withdrawal seizures. Neurology is following. Imaging has thus far been unrevealing. No one is available to provide any additional history.   ASSESSMENT / PLAN:  PULMONARY A: Intubated for airway protection P:   Continue ventilatory support Wean as tolerated SBT when able VAP bundle  CARDIOVASCULAR A:  No acute issues P:  n/a  RENAL A:   Mild AKI (Cr. 0.93 from 0.59) P:   Trend Cr IVF Avoid nephrotoxins  GASTROINTESTINAL A:   No acute issues P:   NPO Stress ulcer ppx  HEMATOLOGIC A:   No acute issues P:  n/a  INFECTIOUS A:   No acute issues P:   n/a  ENDOCRINE A:   No acute issues   P:   n/a  NEUROLOGIC A:   Seizures - etiology unclear; EtOH w/d vs other drug/toxin vs structural lesion P:   Neurology following  Loading w/ keppra per their recs Follow up imaging (CTA head / neck, perfusion) EEG  Continue sedation with propofol RASS goal: 0 to -1    FAMILY  - Updates: None  - Inter-disciplinary family meet or Palliative Care meeting due by:  day 7   The patient is critically ill with multiple organ system failure and requires high complexity decision making for assessment and support, frequent evaluation and titration of therapies, advanced monitoring, review of radiographic studies and interpretation of complex data.   Critical Care Time devoted to patient care services, exclusive of separately billable procedures, described in this note is 40 minutes.   Yisroel Ramming, MD Pulmonary and [place] Pager: [phone]  02/04/2016, 11:45 PM

## 2016-02-04 NOTE — ED Notes (Signed)
Patient transported to CT 

## 2016-02-04 NOTE — ED Notes (Addendum)
Respiratory in room, bolus given per provider, transport to CT

## 2016-02-04 NOTE — Progress Notes (Signed)
Pt was intubated by EDP for airway protection due to seizure activity. ETT 7.5 21@lip  secured with tube holder. Positive color changed on ETCO2. Placed on vent PRVC 530/18/40/5. No complications or distress noted at this time, Will cont to monitor.

## 2016-02-04 NOTE — Consult Note (Signed)
Neurology Consultation Reason for Consult: Seizure Referring Physician: Gloris Manchester  CC: Seizures  History is obtained from:Referring Provider  HPI: Lorraine Tapia is a 65 y.o. female with a history of EtOH abuse who had EMS called due to SEizure activity. On arrival, she was seen by paramedics to have a seizure as well. She was brought to the ED where she was seen to have right sided gaze deviation with nystagmus. She was given ativan and intubated and started on propofol.   At the time of my initial examination, after pausing propofol she became responsive, trying to reach for the tube with her left hand. She had significantly less movement of her right arm and did not withdrawal to noxious stimuli. She also had a left gaze preference she did come back to midline. She was therefore taken for CT perfusion/angiogram to rule out large vessel occlusion versus days or thrombosis (in case the seizures were actually posturing). This was negative.  She does have a history of psychiatric disease and is on Haldol decanoate, also with a history of alcoholism.  LKW: unclear tpa given?: no, unclear time of onset, seizure at onset Premorbid modified rankin scale: unknown  ROS:  Unable to obtain due to altered mental status.   Past Medical History  Diagnosis Date  . Psychiatric disorder   . Depression   . Bipolar disorder ([place])   . Shortness of breath     with exertion  . 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 Hx of radiation therapy 02/21/14-04/06/14    right buccal 66y/70fx     Family History  Problem Relation Age of Onset  . Cancer Mother   . Hypertension Mother   . Hypertension Father      Social History:  reports that she has been smoking Cigarettes.  She has a 24.5 pack-year smoking history. She has never used smokeless tobacco. She reports that she drinks about 1.2 oz of alcohol per week. She reports that she does not use illicit drugs.   Exam: Current vital signs: BP  106/71 mmHg  Pulse 95  Temp(Src) 98.4 F (36.9 C)  Resp 21  Ht 5\' 9"  (1.753 m)  SpO2 99% Vital signs in last 24 hours: Temp:  [97.7 F (36.5 C)-99.5 F (37.5 C)] 98.4 F (36.9 C) (03/05 2132) Pulse Rate:  [95-111] 95 (03/05 2132) Resp:  [14-24] 21 (03/05 2132) BP: (105-191)/(71-100) 106/71 mmHg (03/05 2132) SpO2:  [92 %-100 %] 99 % (03/05 2132) FiO2 (%):  [40 %] 40 % (03/05 2018)   Physical Exam  Constitutional: Appears well-developed and well-nourished.  Psych: Affect appropriate to situation Eyes: No scleral injection HENT: No OP obstrucion Head: Normocephalic.  Cardiovascular: Normal rate and regular rhythm.  Respiratory: Effort normal and breath sounds normal to anterior ascultation GI: Soft.  No distension. There is no tenderness.  Skin: WDI  Neuro: Mental Status: Patient is obtunded but does arouse with noxious stimuli. She does not follow commands.  Cranial Nerves: II: She does not blink to threat from either direction Pupils are equal, round, and reactive to light.   III,IV, VI: She has a left gaze preference V: Blinks to eyelid stimulation bilaterally VII: Facial movement is difficult to judge due to ET tube VIII, X, XI, XII: Unable to assess secondary to patient's altered mental status.  Motor: She has some voluntary, purposeful movements in the left arm and leg, initially she has very little movement of the right arm but subsequently begins moving it  purposefully as well. She does withdraw briskly to noxious stimuli in both legs. Sensory: She grimaces to noxious stimuli of the right arm, withdraws in all other extremities  Cerebellar: Does not comply   I have reviewed labs in epic and the results pertinent to this consultation are: CMP-unremarkable  I have reviewed the images obtained: CTA-no large vessel occlusions. There does appear to be a small enhancing lesion in the left parietal region,? Meningioma.  Impression: 65 year old female with a history  of EtOH abuse who presents with focal seizures. EtOH withdrawal seizures are typically generalized in nature. 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  Recommendations: 1) Keppra 1 g now and then 500 twice a day 2) MRI brain with/without contrast 3) EEG   Roland Rack, MD [facility] [phone]  If 7pm- 7am, please page neurology on call as listed in [place].

## 2016-02-04 NOTE — ED Notes (Signed)
Physician aware of trend in bp, called CT - ready in CT2, called resp to help transport

## 2016-02-04 NOTE — Progress Notes (Signed)
Pt transported for stat CT and back to room A09 without incident. Willl cont to monitor.  Sharyne Richters

## 2016-02-04 NOTE — Progress Notes (Signed)
Pt transported to CT and back to A09 without incident. Will cont to monitor.  Sharyne Richters

## 2016-02-04 NOTE — ED Notes (Signed)
EMS was called to boarding house by [facility], pt was found having a seizure w/ possible drug related.  Unresponsive except to painful stimuli, did have another seizure (70minute) in the squad.  Pupiles, 3, equal and responsive.  Placed on non-rebreather, maintained at 96%.

## 2016-02-04 NOTE — ED Provider Notes (Signed)
CSN: [id number]     Arrival date & time 02/04/16  1938 History   First MD Initiated Contact with Patient 02/04/16 1943     Chief Complaint  Patient presents with  . Seizures     (Consider location/radiation/quality/duration/timing/severity/associated sxs/prior Treatment) Patient is a 65 y.o. female presenting with seizures. The history is provided by the EMS personnel. The history is limited by the condition of the patient.  Seizures  Patient with called EMS from a boarding house for seizures. First responders noted seizures that then stop. EMS witnessed another seizure. Patient arrived here unresponsive but was satting 100% on nonrebreather. Initially patient would respond to painful stimuli. Patient blood sugar was checked at the scene and it was normal. Patient has a history of drinking heavily. His missing many times for alcohol abuse. As was a history of psychiatric problems bipolar. Lithium is not listed as one of her medications.  Past Medical History  Diagnosis Date  . Psychiatric disorder   . Depression   . Bipolar disorder ([place])   . Shortness of breath     with exertion  . 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 Hx of radiation therapy 02/21/14-04/06/14    right buccal 66y/13fx   Past Surgical History  Procedure Laterality Date  . Right buccal mucosa composite resection  10/19/2013    S/P right buccal mucosa composite resection with flap reconstruction and right modified neck dissection on 10/20/2039 by Dr. Fredricka Bonine at [facility]  . Multiple extractions with alveoloplasty N/A 02/01/2014    Procedure: Extraction of tooth #'s 6,7,8,10,11,12,15,16,22,23,25,26,27 with alveoloplasty.;  Surgeon: Lenn Cal, DDS;  Location: [facility];  Service: Oral Surgery;  Laterality: N/A;  ANESTHESIA: GENERAL WITH NASAL TUBE   Family History  Problem Relation Age of Onset  . Cancer Mother   . Hypertension Mother   . Hypertension Father    Social History  Substance  Use Topics  . Smoking status: Current Every Day Smoker -- 0.50 packs/day for 49 years    Types: Cigarettes  . Smokeless tobacco: Never Used  . Alcohol Use: 1.2 oz/week    2 Cans of beer per week     Comment:  2 24  oz beer occasionally   OB History    No data available     Review of Systems  Unable to perform ROS: Mental status change  Neurological: Positive for seizures.      Allergies  Other  Home Medications   Prior to Admission medications   Medication Sig Start Date End Date Taking? Authorizing Provider  atorvastatin (LIPITOR) 20 MG tablet Take 1 tablet (20 mg total) by mouth daily. 03/27/15   Josalyn Funches, MD  chlorthalidone (HYGROTON) 25 MG tablet Take 1 tablet (25 mg total) by mouth daily. 03/24/15   Boykin Nearing, MD  ferrous sulfate 325 (65 FE) MG tablet Take 1 tablet (325 mg total) by mouth daily with breakfast. 03/27/15   Boykin Nearing, MD  haloperidol decanoate (HALDOL DECANOATE) 50 MG/ML injection Inject 200 mg into the muscle every 30 (thirty) days. Last dose 01/26/14    Historical Provider, MD  Vitamin D, Ergocalciferol, (DRISDOL) 50000 UNITS CAPS capsule Take 1 capsule (50,000 Units total) by mouth every 7 (seven) days. For 12 weeks 03/27/15   Josalyn Funches, MD   BP 106/71 mmHg  Pulse 95  Temp(Src) 98.4 F (36.9 C)  Resp 21  Ht 5\' 9"  (1.753 m)  SpO2 99% Physical Exam  Constitutional: She appears well-developed and  well-nourished.  HENT:  Head: Normocephalic and atraumatic.  Mouth/Throat: Oropharynx is clear and moist.  Eyes:  Pupils pinpoint eyes looking forward  Neck: Normal range of motion.  Cardiovascular: Normal rate, regular rhythm and normal heart sounds.   No murmur heard. Pulmonary/Chest: Effort normal and breath sounds normal.  Abdominal: Soft. Bowel sounds are normal. There is no tenderness.  Musculoskeletal: Normal range of motion.  Neurological:  Unresponsive and no movement. No gag.  Skin: Skin is warm.  Nursing note and vitals  reviewed.   ED Course  Procedures (including critical care time) Labs Review Labs Reviewed  COMPREHENSIVE METABOLIC PANEL - Abnormal; Notable for the following:    Glucose, Bld 165 (*)    Total Bilirubin 1.6 (*)    All other components within normal limits  CBC WITH DIFFERENTIAL/PLATELET - Abnormal; Notable for the following:    RBC 3.49 (*)    HCT 35.3 (*)    MCV 101.1 (*)    MCH 34.7 (*)    All other components within normal limits  URINALYSIS, ROUTINE W REFLEX MICROSCOPIC (NOT AT [facility]) - Abnormal; Notable for the following:    Color, Urine AMBER (*)    APPearance CLOUDY (*)    Hgb urine dipstick MODERATE (*)    Bilirubin Urine SMALL (*)    Protein, ur 100 (*)    All other components within normal limits  URINE MICROSCOPIC-ADD ON - Abnormal; Notable for the following:    Squamous Epithelial / LPF 0-5 (*)    Bacteria, UA FEW (*)    Casts GRANULAR CAST (*)    All other components within normal limits  CBG MONITORING, ED - Abnormal; Notable for the following:    Glucose-Capillary 111 (*)    All other components within normal limits  LIPASE, BLOOD  ETHANOL  URINE RAPID DRUG SCREEN, HOSP PERFORMED  ACETAMINOPHEN LEVEL   Results for orders placed or performed during the hospital encounter of 02/04/16  Comprehensive metabolic panel  Result Value Ref Range   Sodium 142 135 - 145 mmol/L   Potassium 3.7 3.5 - 5.1 mmol/L   Chloride 103 101 - 111 mmol/L   CO2 26 22 - 32 mmol/L   Glucose, Bld 165 (H) 65 - 99 mg/dL   BUN 11 6 - 20 mg/dL   Creatinine, Ser 0.93 0.44 - 1.00 mg/dL   Calcium 10.0 8.9 - 10.3 mg/dL   Total Protein 7.8 6.5 - 8.1 g/dL   Albumin 4.2 3.5 - 5.0 g/dL   AST 34 15 - 41 U/L   ALT 17 14 - 54 U/L   Alkaline Phosphatase 58 38 - 126 U/L   Total Bilirubin 1.6 (H) 0.3 - 1.2 mg/dL   GFR calc non Af Amer >60 >60 mL/min   GFR calc Af Amer >60 >60 mL/min   Anion gap 13 5 - 15  Lipase, blood  Result Value Ref Range   Lipase 25 11 - 51 U/L  CBC with  Differential/Platelet  Result Value Ref Range   WBC 9.6 4.0 - 10.5 K/uL   RBC 3.49 (L) 3.87 - 5.11 MIL/uL   Hemoglobin 12.1 12.0 - 15.0 g/dL   HCT 35.3 (L) 36.0 - 46.0 %   MCV 101.1 (H) 78.0 - 100.0 fL   MCH 34.7 (H) 26.0 - 34.0 pg   MCHC 34.3 30.0 - 36.0 g/dL   RDW 13.9 11.5 - 15.5 %   Platelets 280 150 - 400 K/uL   Neutrophils Relative % 80 %  Neutro Abs 7.6 1.7 - 7.7 K/uL   Lymphocytes Relative 14 %   Lymphs Abs 1.4 0.7 - 4.0 K/uL   Monocytes Relative 6 %   Monocytes Absolute 0.6 0.1 - 1.0 K/uL   Eosinophils Relative 0 %   Eosinophils Absolute 0.0 0.0 - 0.7 K/uL   Basophils Relative 0 %   Basophils Absolute 0.0 0.0 - 0.1 K/uL  Ethanol  Result Value Ref Range   Alcohol, Ethyl (B) <5 <5 mg/dL  Urinalysis, Routine w reflex microscopic (not at [facility])  Result Value Ref Range   Color, Urine AMBER (A) YELLOW   APPearance CLOUDY (A) CLEAR   Specific Gravity, Urine 1.020 1.005 - 1.030   pH 5.0 5.0 - 8.0   Glucose, UA NEGATIVE NEGATIVE mg/dL   Hgb urine dipstick MODERATE (A) NEGATIVE   Bilirubin Urine SMALL (A) NEGATIVE   Ketones, ur NEGATIVE NEGATIVE mg/dL   Protein, ur 100 (A) NEGATIVE mg/dL   Nitrite NEGATIVE NEGATIVE   Leukocytes, UA NEGATIVE NEGATIVE  Urine rapid drug screen (hosp performed)  Result Value Ref Range   Opiates NONE DETECTED NONE DETECTED   Cocaine NONE DETECTED NONE DETECTED   Benzodiazepines NONE DETECTED NONE DETECTED   Amphetamines NONE DETECTED NONE DETECTED   Tetrahydrocannabinol NONE DETECTED NONE DETECTED   Barbiturates NONE DETECTED NONE DETECTED  Urine microscopic-add on  Result Value Ref Range   Squamous Epithelial / LPF 0-5 (A) NONE SEEN   WBC, UA NONE SEEN 0 - 5 WBC/hpf   RBC / HPF 0-5 0 - 5 RBC/hpf   Bacteria, UA FEW (A) NONE SEEN   Casts GRANULAR CAST (A) NEGATIVE   Urine-Other AMORPHOUS URATES/PHOSPHATES   POC CBG, ED  Result Value Ref Range   Glucose-Capillary 111 (H) 65 - 99 mg/dL     Imaging Review Ct Head Wo  Contrast  02/04/2016  CLINICAL DATA:  Patient was found unresponsive and having seizures. Possible drug related. EXAM: CT HEAD WITHOUT CONTRAST TECHNIQUE: Contiguous axial images were obtained from the base of the skull through the vertex without intravenous contrast. COMPARISON:  11/12/2014 FINDINGS: Mild cerebral atrophy. Mild patchy low-attenuation change in the deep white matter may indicate small vessel ischemia. Borderline for ventricular dilatation. No mass effect or midline shift. No abnormal extra-axial fluid collections. Gray-white matter junctions are distinct. Basal cisterns are not effaced. No evidence of acute intracranial hemorrhage. No depressed skull fractures. Visualized paranasal sinuses and mastoid air cells are not opacified. IMPRESSION: No acute intracranial abnormalities. Mild chronic atrophy and small vessel ischemic changes suggested. Electronically Signed   By: Lucienne Capers M.D.   On: 02/04/2016 22:32   Dg Chest Port 1 View  02/04/2016  CLINICAL DATA:  Endotracheal tube adjustment. EXAM: PORTABLE CHEST 1 VIEW COMPARISON:  Earlier this day. FINDINGS: Endotracheal tube is now visualized 7.9 cm from the carina. Previous asymmetric aeration of the lungs is less well visualized on the current exam. Cardiomediastinal contours are normal. No pulmonary edema. Vague increased density in the right suprahilar lung. IMPRESSION: 1. Endotracheal tube 7.9 cm from the carina. 2. Decreasing asymmetric lung aeration. 3. Vague density in the right suprahilar lung, may simply represent overlapping osseous structures. When patient is able, recommend PA and lateral views. Electronically Signed   By: Jeb Levering M.D.   On: 02/04/2016 23:01   Dg Chest Portable 1 View  02/04/2016  CLINICAL DATA:  Shortness of breath.  Intubation. EXAM: PORTABLE CHEST 1 VIEW COMPARISON:  09/26/2014 FINDINGS: Endotracheal tube is not visualized. There is  asymmetric lung aeration, right increased compared to the left with  relative lucency of the right lung. No significant rotation is seen. Heart size and mediastinal contours are normal. No evidence of pulmonary edema, confluent airspace disease or large pleural effusion. IMPRESSION: 1. Endotracheal tube not visualized. Tube may be above the thoracic inlet not included in the field of view, however recommend repositioning and repeat exam. 2. Asymmetric lung aeration, with the right lung hyperaerated and with decreased density compared to the left. Preferential right mainstem intubation with subsequent retraction could produce this appearance. An anterior pneumothorax is not excluded. Continued follow-up is recommended. These results were called by telephone at the time of interpretation on 02/04/2016 at 9:41 pm to Dr. Fredia Sorrow , who verbally acknowledged these results. Patient is reportedly ventilating with normal oxygen saturation and equal breath sounds. Electronically Signed   By: Jeb Levering M.D.   On: 02/04/2016 21:44   I have personally reviewed and evaluated these images and lab results as part of my medical decision-making.   EKG Interpretation   Date/Time:  Sunday February 04 2016 19:54:06 EST Ventricular Rate:  104 PR Interval:  142 QRS Duration: 81 QT Interval:  329 QTC Calculation: 433 R Axis:   54 Text Interpretation:  Sinus tachycardia Atrial premature complexes  Consider left ventricular hypertrophy Confirmed by Katerina Zurn  MD, Lameeka Schleifer  ([id number]) on 02/04/2016 10:01:38 PM       CRITICAL CARE Performed by: Fredia Sorrow Total critical care time: 30 minutes Critical care time was exclusive of separately billable procedures and treating other patients. Critical care was necessary to treat or prevent imminent or life-threatening deterioration. Critical care was time spent personally by me on the following activities: development of treatment plan with patient and/or surrogate as well as nursing, discussions with consultants, evaluation of  patient's response to treatment, examination of patient, obtaining history from patient or surrogate, ordering and performing treatments and interventions, ordering and review of laboratory studies, ordering and review of radiographic studies, pulse oximetry and re-evaluation of patient's condition.  INTUBATION Performed by: Kashius Dominic  Required items: required blood products, implants, devices, and special equipment available Patient identity confirmed: provided demographic data and hospital-assigned identification number Time out: Immediately prior to procedure a "time out" was called to verify the correct patient, procedure, equipment, support staff and site/side marked as required.  Indications: unresponsive  Intubation method: Glidescope Laryngoscopy   Preoxygenation:  100%BVM  Sedatives:  20 mg Etomidate Paralytic:  100 mg Succinylcholine Also Lidocaine 100 mg Tube Size:  7.5 cuffed  Post-procedure assessment: chest rise and ETCO2 monitor Breath sounds: equal and absent over the epigastrium Tube secured with: ETT holder Chest x-ray interpreted by radiologist and me.  Chest x-ray findings: endotracheal tube in appropriate positiont was repositioned  Patient tolerated the procedure well with no immediate complications.    MDM   Final diagnoses:  Unresponsiveness  Seizure ([place])   Shortly after arrival patient had eyes deviating to the right side with nystagmus. I was some concern that perhaps she was maybe still seizing. So patient was intubated. Patient given Ativan was tried Narcan prior to intubation no significant change. Patient was intubated and started on propofol drip. Head CT showed no acute findings. No evidence of head bleed. Patient's blood pressure was originally quite high but then came down mostly due to the propofol. Critical care and neurology consult. Neurology to patient over for the CTA vascular studies to rule out any major vessel problems. They were  negative.  They are  going to start her on a Keppra drip. Patient will be admitted by critical care. Patient's lab workup without any explanation urine drug screen was negative. 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  Patient brought in by EMS 2 witnessed seizures prior to arrival 1 by first responders and then the other by the paramedics. There was some concern that it may be drug related. The call came from a boarding house. Upon arrival patient was unresponsive even to painful stimuli however was showing no evidence of seizure.        Fredia Sorrow, MD 02/04/16 [phone]

## 2016-02-05 ENCOUNTER — Inpatient Hospital Stay (HOSPITAL_COMMUNITY): Payer: Medicaid Other

## 2016-02-05 DIAGNOSIS — G8384 Todd's paralysis (postepileptic): Secondary | ICD-10-CM | POA: Diagnosis present

## 2016-02-05 DIAGNOSIS — J9600 Acute respiratory failure, unspecified whether with hypoxia or hypercapnia: Secondary | ICD-10-CM

## 2016-02-05 DIAGNOSIS — G9340 Encephalopathy, unspecified: Secondary | ICD-10-CM | POA: Diagnosis not present

## 2016-02-05 DIAGNOSIS — E871 Hypo-osmolality and hyponatremia: Secondary | ICD-10-CM | POA: Diagnosis not present

## 2016-02-05 DIAGNOSIS — Z85818 Personal history of malignant neoplasm of other sites of lip, oral cavity, and pharynx: Secondary | ICD-10-CM | POA: Diagnosis not present

## 2016-02-05 DIAGNOSIS — Z23 Encounter for immunization: Secondary | ICD-10-CM | POA: Diagnosis not present

## 2016-02-05 DIAGNOSIS — D329 Benign neoplasm of meninges, unspecified: Secondary | ICD-10-CM | POA: Diagnosis present

## 2016-02-05 DIAGNOSIS — Z781 Physical restraint status: Secondary | ICD-10-CM | POA: Diagnosis not present

## 2016-02-05 DIAGNOSIS — F17210 Nicotine dependence, cigarettes, uncomplicated: Secondary | ICD-10-CM | POA: Diagnosis present

## 2016-02-05 DIAGNOSIS — D509 Iron deficiency anemia, unspecified: Secondary | ICD-10-CM | POA: Diagnosis present

## 2016-02-05 DIAGNOSIS — F319 Bipolar disorder, unspecified: Secondary | ICD-10-CM | POA: Diagnosis present

## 2016-02-05 DIAGNOSIS — F1010 Alcohol abuse, uncomplicated: Secondary | ICD-10-CM | POA: Diagnosis not present

## 2016-02-05 DIAGNOSIS — F10239 Alcohol dependence with withdrawal, unspecified: Secondary | ICD-10-CM | POA: Diagnosis present

## 2016-02-05 DIAGNOSIS — E785 Hyperlipidemia, unspecified: Secondary | ICD-10-CM | POA: Diagnosis present

## 2016-02-05 DIAGNOSIS — R569 Unspecified convulsions: Secondary | ICD-10-CM | POA: Diagnosis present

## 2016-02-05 DIAGNOSIS — N179 Acute kidney failure, unspecified: Secondary | ICD-10-CM | POA: Diagnosis present

## 2016-02-05 DIAGNOSIS — Z923 Personal history of irradiation: Secondary | ICD-10-CM | POA: Diagnosis not present

## 2016-02-05 DIAGNOSIS — H5500 Unspecified nystagmus: Secondary | ICD-10-CM | POA: Diagnosis present

## 2016-02-05 LAB — BASIC METABOLIC PANEL
ANION GAP: 15 (ref 5–15)
BUN: 14 mg/dL (ref 6–20)
CHLORIDE: 106 mmol/L (ref 101–111)
CO2: 20 mmol/L — ABNORMAL LOW (ref 22–32)
Calcium: 9.4 mg/dL (ref 8.9–10.3)
Creatinine, Ser: 0.89 mg/dL (ref 0.44–1.00)
GFR calc Af Amer: >60 mL/min (ref >60)
GLUCOSE: 73 mg/dL (ref 65–99)
POTASSIUM: 4.5 mmol/L (ref 3.5–5.1)
Sodium: 141 mmol/L (ref 135–145)

## 2016-02-05 LAB — CBC
HEMATOCRIT: 34.7 % — AB (ref 36.0–46.0)
HEMOGLOBIN: 11.8 g/dL — AB (ref 12.0–15.0)
MCH: 34.3 pg — ABNORMAL HIGH (ref 26.0–34.0)
MCHC: 34 g/dL (ref 30.0–36.0)
MCV: 100.9 fL — AB (ref 78.0–100.0)
PLATELETS: 207 10*3/uL (ref 150–400)
RBC: 3.44 MIL/uL — AB (ref 3.87–5.11)
RDW: 13.9 % (ref 11.5–15.5)
WBC: 10.3 10*3/uL (ref 4.0–10.5)

## 2016-02-05 LAB — I-STAT ARTERIAL BLOOD GAS, ED
ACID-BASE DEFICIT: 1 mmol/L (ref 0.0–2.0)
Bicarbonate: 24.2 mEq/L — ABNORMAL HIGH (ref 20.0–24.0)
O2 Saturation: 99 %
PH ART: 7.383 (ref 7.350–7.450)
PO2 ART: 135 mmHg — AB (ref 80.0–100.0)
TCO2: 25 mmol/L (ref 0–100)
pCO2 arterial: 40.6 mmHg (ref 35.0–45.0)

## 2016-02-05 LAB — CREATININE, SERUM
Creatinine, Ser: 0.94 mg/dL (ref 0.44–1.00)
GFR calc Af Amer: >60 mL/min (ref >60)
GFR calc non Af Amer: >60 mL/min (ref >60)

## 2016-02-05 LAB — PHOSPHORUS: Phosphorus: 5 mg/dL — ABNORMAL HIGH (ref 2.5–4.6)

## 2016-02-05 LAB — ACETAMINOPHEN LEVEL: Acetaminophen (Tylenol), Serum: <10 ug/mL — ABNORMAL LOW (ref 10–30)

## 2016-02-05 LAB — MAGNESIUM: Magnesium: 2 mg/dL (ref 1.7–2.4)

## 2016-02-05 LAB — MRSA PCR SCREENING: MRSA BY PCR: NEGATIVE

## 2016-02-05 MED ORDER — CHLORHEXIDINE GLUCONATE 0.12% ORAL RINSE (MEDLINE KIT)
15.0000 mL | Freq: Two times a day (BID) | OROMUCOSAL | Status: DC
  Administered 2016-02-05: 15 mL via OROMUCOSAL

## 2016-02-05 MED ORDER — ANTISEPTIC ORAL RINSE SOLUTION (CORINZ)
7.0000 mL | Freq: Four times a day (QID) | OROMUCOSAL | Status: DC
  Administered 2016-02-05: 7 mL via OROMUCOSAL

## 2016-02-05 MED ORDER — FAMOTIDINE 40 MG/5ML PO SUSR: 20.0000 mg | Freq: Two times a day (BID) | ORAL | Status: DC

## 2016-02-05 MED ORDER — VITAMIN B-1 100 MG PO TABS
100.0000 mg | ORAL_TABLET | Freq: Every day | ORAL | Status: DC
  Administered 2016-02-05 – 2016-02-07 (×3): 100 mg via ORAL
  Filled 2016-02-05 (×3): qty 1

## 2016-02-05 MED ORDER — SODIUM CHLORIDE 0.9 % IV SOLN
1000.0000 mg | Freq: Once | INTRAVENOUS | Status: AC
  Administered 2016-02-05: 1000 mg via INTRAVENOUS
  Filled 2016-02-05: qty 10

## 2016-02-05 MED ORDER — ENOXAPARIN SODIUM 40 MG/0.4ML ~~LOC~~ SOLN
40.0000 mg | Freq: Every day | SUBCUTANEOUS | Status: DC
  Administered 2016-02-05 – 2016-02-07 (×3): 40 mg via SUBCUTANEOUS
  Filled 2016-02-05 (×3): qty 0.4

## 2016-02-05 MED ORDER — LORAZEPAM 2 MG/ML IJ SOLN
1.0000 mg | INTRAMUSCULAR | Status: DC | PRN
  Administered 2016-02-05 – 2016-02-06 (×2): 2 mg via INTRAVENOUS
  Filled 2016-02-05 (×2): qty 1

## 2016-02-05 MED ORDER — PROPOFOL 1000 MG/100ML IV EMUL
INTRAVENOUS | Status: AC
  Filled 2016-02-05: qty 100

## 2016-02-05 MED ORDER — FAMOTIDINE 20 MG PO TABS
20.0000 mg | ORAL_TABLET | Freq: Two times a day (BID) | ORAL | Status: DC
  Filled 2016-02-05: qty 1

## 2016-02-05 MED ORDER — ADULT MULTIVITAMIN W/MINERALS CH
1.0000 | ORAL_TABLET | Freq: Every day | ORAL | Status: DC
  Administered 2016-02-05 – 2016-02-07 (×3): 1 via ORAL
  Filled 2016-02-05 (×3): qty 1

## 2016-02-05 MED ORDER — ANTISEPTIC ORAL RINSE SOLUTION (CORINZ)
7.0000 mL | OROMUCOSAL | Status: DC
  Administered 2016-02-05 (×3): 7 mL via OROMUCOSAL

## 2016-02-05 MED ORDER — CETYLPYRIDINIUM CHLORIDE 0.05 % MT LIQD
7.0000 mL | Freq: Two times a day (BID) | OROMUCOSAL | Status: DC
  Administered 2016-02-05 – 2016-02-06 (×3): 7 mL via OROMUCOSAL

## 2016-02-05 MED ORDER — SODIUM CHLORIDE 0.9 % IV SOLN
250.0000 mL | INTRAVENOUS | Status: DC | PRN
Start: 2016-02-05 — End: 2016-02-07
  Administered 2016-02-06: 250 mL via INTRAVENOUS

## 2016-02-05 MED ORDER — SODIUM CHLORIDE 0.9 % IV SOLN
500.0000 mg | Freq: Two times a day (BID) | INTRAVENOUS | Status: DC
  Administered 2016-02-05 – 2016-02-07 (×5): 500 mg via INTRAVENOUS
  Filled 2016-02-05 (×7): qty 5

## 2016-02-05 MED ORDER — FOLIC ACID 1 MG PO TABS
1.0000 mg | ORAL_TABLET | Freq: Every day | ORAL | Status: DC
  Administered 2016-02-05 – 2016-02-07 (×3): 1 mg via ORAL
  Filled 2016-02-05 (×3): qty 1

## 2016-02-05 NOTE — Progress Notes (Signed)
Pt transported from ED to 3M08 on vent. No complications.

## 2016-02-05 NOTE — Procedures (Signed)
Extubation Procedure Note  Patient Details:   Name: NA GATTO DOB: 06-04-1951 MRN: [id number]   Airway Documentation:  Airway 7.5 mm (Active)  Secured at (cm) 23 cm 02/05/2016  3:41 AM  Measured From Lips 02/05/2016  3:41 AM  [place] 02/05/2016  3:41 AM  Secured By [employer] 02/05/2016  3:41 AM  Tube Holder Repositioned Yes 02/05/2016  3:41 AM  Site Condition Cool;Dry 02/05/2016  3:41 AM    Evaluation  O2 sats: stable throughout Complications: No apparent complications Patient did tolerate procedure well. Bilateral Breath Sounds: Clear, Diminished Suctioning: Airway Yes   Patient was extubated to a 4L [place]. Cuff leak was heard. No stridor was noted. RN was at the bedside with RT. RT will continue to monitor.  Tiburcio Bash 02/05/2016, 8:39 AM

## 2016-02-05 NOTE — Procedures (Cosign Needed)
ELECTROENCEPHALOGRAM REPORT  Date of Study: 02/05/2016  Patient's Name: Lorraine Tapia MRN: [id number] Date of Birth: 05-25-1951  Referring Provider: Roland Rack, MD  Indication: 65 year old woman with new onset seizures, possibly alcohol-withdrawal  Medications: folic acid (FOLVITE) tablet 1 mg levETIRAcetam (KEPPRA) 500 mg in sodium chloride 0.9 % 100 mL IVPB LORazepam (ATIVAN) injection 1-2 mg multivitamin with minerals tablet 1 tablet thiamine (VITAMIN B-1) tablet 100 mg  Technical Summary: This is a multichannel digital EEG recording, using the international 10-20 placement system with electrodes applied with paste and impedances below 5000 ohms.    Description: The EEG background is mostly recorded in the drowsy state, with a posterior dominant rhythm of 7 Hz.  Diffuse beta activity is seen.  Intermittent left temporal slowing is noted.  No focal or generalized epileptiform discharges are seen.  Some of the recording is compromised by muscle and chew artifact.  Stage II sleep is not seen.  Hyperventilation and photic stimulation were not performed.  ECG revealed normal cardiac rate and rhythm.  Impression: This is an abnormal EEG of the awake and mostly drowsy state due to intermittent left temporal slowing.  This is indicative of a possible structural or physiologic abnormality from this region.  No epileptiform discharges were seen.  Adam R. Tomi Likens, DO

## 2016-02-05 NOTE — Progress Notes (Signed)
Subjective: No recurrence of seizure activity reported overnight. Patient has been agitated and required restraints.  Objective: Current vital signs: BP 124/60 mmHg  Pulse 72  Temp(Src) 99.9 F (37.7 C) (Core (Comment))  Resp 18  Ht 5\' 9"  (1.753 m)  Wt 67.9 kg (149 lb 11.1 oz)  BMI 22.10 kg/m2  SpO2 100%  Neurologic Exam: Patient was awake and moderately agitated. She was able to follow simple commands. She was still intubated but was about to be extubated. Extraocular movements were full and conjugate. Face was symmetrical. Patient moved extremities equally with symmetrical strength throughout. Tone was normal throughout. Deep tendon reflexes were 1+ and symmetrical.  Plantar responses were mute.  Medications: I have reviewed the patient's current medications.  Assessment/Plan:   65 year old lady admitted following recurrent generalized seizures of new onset, possibly related to alcohol withdrawal. Patient had focal weakness as well which has resolved. 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 MRI of the brain is pending to rule out possible stroke. EEG is also pending. Patient's currently on Keppra no recurrence of seizure activity.  Recommend no changes in current management, including continuing Keppra for now. We will continue to follow this patient with you  C.R. Nicole Kindred, MD [facility] [phone]  02/05/2016  10:13 AM

## 2016-02-05 NOTE — Progress Notes (Signed)
EEG completed, results pending. 

## 2016-02-05 NOTE — Progress Notes (Signed)
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 EMS was called to her residence for seizure activity. She had a witnessed seizure en route to the hospital. She was noted to have rightward gaze deviation with nystagmus. She was intubated for airway protection and started on propofol. History is obtained from the chart and ED provider. No family is available.   Labs are notable for benign UA, normal glucose, mild AKI (Cr 0.98 from 0.59 10 months ago), Tbili 1.6.  Ct head was relatively unremarkable. CTA head and neck neg   SUBJ - agitated off propofol gt Low gr febrile   VITAL SIGNS: Temp:  [97.7 F (36.5 C)-101.5 F (38.6 C)] 99.5 F (37.5 C) (03/06 1100) Pulse Rate:  [67-111] 67 (03/06 1100) Resp:  [0-30] 14 (03/06 1100) BP: (78-191)/(55-108) 128/64 mmHg (03/06 1100) SpO2:  [92 %-100 %] 100 % (03/06 1100) FiO2 (%):  [40 %] 40 % (03/06 0830) Weight:  [67.9 kg (149 lb 11.1 oz)] 67.9 kg (149 lb 11.1 oz) (03/06 0312)  HEMODYNAMICS:    VENTILATOR SETTINGS: Vent Mode:  [-] PSV;CPAP FiO2 (%):  [40 %] 40 % Set Rate:  [18 bmp] 18 bmp Vt Set:  [530 mL] 530 mL PEEP:  [5 cmH20] 5 cmH20 Pressure Support:  [5 cmH20] 5 cmH20 Plateau Pressure:  [12 cmH20-15 cmH20] 12 cmH20  INTAKE / OUTPUT: I/O last 3 completed shifts: In: 2047.2 [I.V.:1947.2; IV Piggyback:100] Out: 780 [Urine:780]  PHYSICAL EXAMINATION: General:  Thin, well-developed AAF, intubated Neuro:  Awake, agitated, follows 1 step commands HEENT:  Post-surgical / post-radiation changes to face/cheek. Edentulous. ETT/OGT in place. Cardiovascular:   Regular rhythm, normal rate, s1, s2, no m/r/g. Distal pulses palpable.  Lungs:  Coarse bilaterally, but generally clear to auscultation without wheezes, rales or ronchi. Vent-assisted effort. Symmetrical expansion Abdomen:  Soft, non-distended, +bs, no organomegaly or mass. Unable to assess tenderness Musculoskeletal:  No bony deformities.  Skin:  Dry, intact. No rashes / sores / ulcers.   LABS:  BMET  Recent Labs Lab 02/04/16 2000 02/05/16 0226  NA 142 141  K 3.7 4.5  CL 103 106  CO2 26 20*  BUN 11 14  CREATININE 0.93 0.89  0.94  GLUCOSE 165* 73    Electrolytes  Recent Labs Lab 02/04/16 2000 02/05/16 0226  CALCIUM 10.0 9.4  MG  --  2.0  PHOS  --  5.0*    CBC  Recent Labs Lab 02/04/16 2000 02/05/16 0226  WBC 9.6 10.3  HGB 12.1 11.8*  HCT 35.3* 34.7*  PLT 280 207    Coag's No results for input(s): APTT, INR in the last 168 hours.  Sepsis Markers No results for input(s): LATICACIDVEN, PROCALCITON, O2SATVEN in the last 168 hours.  ABG  Recent Labs Lab 02/05/16 0233  PHART 7.383  PCO2ART 40.6  PO2ART 135.0*    Liver Enzymes  Recent Labs Lab 02/04/16 2000  AST 34  ALT 17  ALKPHOS 58  BILITOT 1.6*  ALBUMIN 4.2    Cardiac Enzymes No results for input(s): TROPONINI, PROBNP in the last 168 hours.  Glucose  Recent Labs Lab 02/04/16 2113  GLUCAP 111*  Imaging Ct Angio Head W/cm &/or Wo Cm  02/05/2016  CLINICAL DATA:  Initial evaluation for acute right-sided weakness. EXAM: CT CEREBRAL PERFUSION WITH CONTRAST; CT ANGIOGRAPHY NECK; CT ANGIOGRAPHY HEAD TECHNIQUE: Multidetector CT imaging of the neck was performed during bolus injection of intravenous contrast. Multiplanar CT angiographic image reconstructions including MIPs were generated to evaluate the extra-cranial carotid arteries.; Multidetector CT imaging of the brain was performed during bolus injection of intravenous contrast. Multiplanar CT angiographic image reconstructions  including MIPs were generated to evaluate cerebral vasculature centered at the [place]. CONTRAST:  126mL OMNIPAQUE IOHEXOL 350 MG/ML SOLN COMPARISON:  Prior noncontrast CT from earlier the same day. FINDINGS: CTA NECK: Visualized aortic arch of normal caliber with normal 3 vessel morphology. Scattered calcified plaque within the arch itself and about the origin of the left subclavian artery without high-grade stenosis. Subclavian arteries are well opacified distally. RIGHT CAROTID SYSTEM: Right common carotid artery widely patent from its origin to the bifurcation. Scattered eccentric calcified plaque about the bifurcation without high-grade stenosis. Right ICA patent from the bifurcation to the skullbase. Right external carotid artery and its branches within normal limits. No high-grade flow-limiting stenosis, dissection, or vascular occlusion identified within the right carotid artery system. LEFT CAROTID SYSTEM: Left common carotid artery patent from its origin to the bifurcation. Mild calcified plaque about the bifurcation without high-grade stenosis. Minimal a centric plaque within the proximal left ICA without stenosis. Left ICA widely patent from its origin to the skullbase. Left external carotid artery and its branches within normal limits. No high-grade stenosis, dissection, or vascular occlusion within left carotid artery system. VERTEBRAL ARTERIES: Both vertebral arteries arise from the subclavian arteries. Vertebral arteries well opacified along their entire course without evidence for dissection, stenosis, or occlusion. SKELETON: No acute osseous abnormality. No worrisome lytic or blastic osseous lesions. Degenerative disc bulge noted at C3-4. SOFT TISSUES: Patient is intubated. Fluid within the hypopharynx and nasopharynx likely related intubation. Scattered subcentimeter nodules noted within the left lobe of thyroid, of doubtful clinical significance. No adenopathy or acute soft tissue  abnormality within the neck. Mild asymmetric thickening along the right platysmas, which may be postsurgical in nature given scattered surgical clips within this region. Visualized superior mediastinum within normal limits. The visualized lungs are clear.  Mild centrilobular emphysema. CTA HEAD: ANTERIOR: The petrous, cavernous, and supraclinoid segments of both ICAs are widely patent without stenosis or occlusion. A1 segments patent. Anterior communicating artery normal. Anterior cerebral arteries well opacified to their distal aspects. M1 segments patent without occlusion or high-grade stenosis. MCA bifurcations within normal limits. No proximal M2 branch occlusion. Distal MCA branches fairly symmetric and well opacified bilaterally. POSTERIOR: Vertebral arteries patent to the vertebrobasilar junction. Posterior inferior cerebral arteries patent bilaterally. Basilar artery mildly irregular without high-grade stenosis, and well opacified to its distal aspect. Superior cerebellar arteries patent. Both posterior cerebral arteries arise from the basilar artery and are well opacified to their distal aspects. Small right posterior communicating artery noted. VENOUS: Grossly patent without venous sinus thrombosis. VARIANTS: None.  No aneurysm. DELAYED: Not performed. Incidental note made of a small 1 cm meningioma overlying the left cerebral convexity (series 502, image 65). No significant mass effect. CT PERFUSION: No perfusion mismatch to suggest acute stroke or active ischemia identified. Relatively symmetric cerebral blood flow and cerebral blood volume, as well as mean transit time and time to peak present. No evidence for penumbra. IMPRESSION: CTA NECK IMPRESSION: 1. No critical or high-grade flow-limiting stenosis identified within the  major arterial vasculature of the neck. 2. Fairly mild atheromatous plaque about the carotid bifurcations without significant stenosis. 3. Mild centrilobular emphysema. CTA HEAD  IMPRESSION: 1. Negative intracranial CTA. No large or proximal arterial branch occlusion within the intracranial circulation. No high-grade or flow-limiting stenosis. 2. 1 cm meningioma overlying the left cerebral convexity without mass effect. CT PERFUSION IMPRESSION: Negative CT perfusion. No perfusion mismatch to suggest acute stroke or active ischemia. Critical Value/emergent results were called by telephone at the time of interpretation on 02/05/2016 at 12:14 am to Dr. Roland Rack , who verbally acknowledged these results. Electronically Signed   By: Jeannine Boga M.D.   On: 02/05/2016 00:39   Ct Head Wo Contrast  02/04/2016  CLINICAL DATA:  Patient was found unresponsive and having seizures. Possible drug related. EXAM: CT HEAD WITHOUT CONTRAST TECHNIQUE: Contiguous axial images were obtained from the base of the skull through the vertex without intravenous contrast. COMPARISON:  11/12/2014 FINDINGS: Mild cerebral atrophy. Mild patchy low-attenuation change in the deep white matter may indicate small vessel ischemia. Borderline for ventricular dilatation. No mass effect or midline shift. No abnormal extra-axial fluid collections. Gray-white matter junctions are distinct. Basal cisterns are not effaced. No evidence of acute intracranial hemorrhage. No depressed skull fractures. Visualized paranasal sinuses and mastoid air cells are not opacified. IMPRESSION: No acute intracranial abnormalities. Mild chronic atrophy and small vessel ischemic changes suggested. Electronically Signed   By: Lucienne Capers M.D.   On: 02/04/2016 22:32   Ct Angio Neck W/cm &/or Wo/cm  02/05/2016  CLINICAL DATA:  Initial evaluation for acute right-sided weakness. EXAM: CT CEREBRAL PERFUSION WITH CONTRAST; CT ANGIOGRAPHY NECK; CT ANGIOGRAPHY HEAD TECHNIQUE: Multidetector CT imaging of the neck was performed during bolus injection of intravenous contrast. Multiplanar CT angiographic image reconstructions including MIPs  were generated to evaluate the extra-cranial carotid arteries.; Multidetector CT imaging of the brain was performed during bolus injection of intravenous contrast. Multiplanar CT angiographic image reconstructions including MIPs were generated to evaluate cerebral vasculature centered at the [place]. CONTRAST:  14mL OMNIPAQUE IOHEXOL 350 MG/ML SOLN COMPARISON:  Prior noncontrast CT from earlier the same day. FINDINGS: CTA NECK: Visualized aortic arch of normal caliber with normal 3 vessel morphology. Scattered calcified plaque within the arch itself and about the origin of the left subclavian artery without high-grade stenosis. Subclavian arteries are well opacified distally. RIGHT CAROTID SYSTEM: Right common carotid artery widely patent from its origin to the bifurcation. Scattered eccentric calcified plaque about the bifurcation without high-grade stenosis. Right ICA patent from the bifurcation to the skullbase. Right external carotid artery and its branches within normal limits. No high-grade flow-limiting stenosis, dissection, or vascular occlusion identified within the right carotid artery system. LEFT CAROTID SYSTEM: Left common carotid artery patent from its origin to the bifurcation. Mild calcified plaque about the bifurcation without high-grade stenosis. Minimal a centric plaque within the proximal left ICA without stenosis. Left ICA widely patent from its origin to the skullbase. Left external carotid artery and its branches within normal limits. No high-grade stenosis, dissection, or vascular occlusion within left carotid artery system. VERTEBRAL ARTERIES: Both vertebral arteries arise from the subclavian arteries. Vertebral arteries well opacified along their entire course without evidence for dissection, stenosis, or occlusion. SKELETON: No acute osseous abnormality. No worrisome lytic or blastic osseous lesions. Degenerative disc bulge noted at C3-4. SOFT TISSUES: Patient is intubated. Fluid  within the hypopharynx and nasopharynx likely related intubation. Scattered subcentimeter nodules noted within the left lobe of thyroid,  of doubtful clinical significance. No adenopathy or acute soft tissue abnormality within the neck. Mild asymmetric thickening along the right platysmas, which may be postsurgical in nature given scattered surgical clips within this region. Visualized superior mediastinum within normal limits. The visualized lungs are clear.  Mild centrilobular emphysema. CTA HEAD: ANTERIOR: The petrous, cavernous, and supraclinoid segments of both ICAs are widely patent without stenosis or occlusion. A1 segments patent. Anterior communicating artery normal. Anterior cerebral arteries well opacified to their distal aspects. M1 segments patent without occlusion or high-grade stenosis. MCA bifurcations within normal limits. No proximal M2 branch occlusion. Distal MCA branches fairly symmetric and well opacified bilaterally. POSTERIOR: Vertebral arteries patent to the vertebrobasilar junction. Posterior inferior cerebral arteries patent bilaterally. Basilar artery mildly irregular without high-grade stenosis, and well opacified to its distal aspect. Superior cerebellar arteries patent. Both posterior cerebral arteries arise from the basilar artery and are well opacified to their distal aspects. Small right posterior communicating artery noted. VENOUS: Grossly patent without venous sinus thrombosis. VARIANTS: None.  No aneurysm. DELAYED: Not performed. Incidental note made of a small 1 cm meningioma overlying the left cerebral convexity (series 502, image 65). No significant mass effect. CT PERFUSION: No perfusion mismatch to suggest acute stroke or active ischemia identified. Relatively symmetric cerebral blood flow and cerebral blood volume, as well as mean transit time and time to peak present. No evidence for penumbra. IMPRESSION: CTA NECK IMPRESSION: 1. No critical or high-grade flow-limiting  stenosis identified within the major arterial vasculature of the neck. 2. Fairly mild atheromatous plaque about the carotid bifurcations without significant stenosis. 3. Mild centrilobular emphysema. CTA HEAD IMPRESSION: 1. Negative intracranial CTA. No large or proximal arterial branch occlusion within the intracranial circulation. No high-grade or flow-limiting stenosis. 2. 1 cm meningioma overlying the left cerebral convexity without mass effect. CT PERFUSION IMPRESSION: Negative CT perfusion. No perfusion mismatch to suggest acute stroke or active ischemia. Critical Value/emergent results were called by telephone at the time of interpretation on 02/05/2016 at 12:14 am to Dr. Roland Rack , who verbally acknowledged these results. Electronically Signed   By: Jeannine Boga M.D.   On: 02/05/2016 00:39   Ct Cerebral Perfusion W/cm  02/05/2016  CLINICAL DATA:  Initial evaluation for acute right-sided weakness. EXAM: CT CEREBRAL PERFUSION WITH CONTRAST; CT ANGIOGRAPHY NECK; CT ANGIOGRAPHY HEAD TECHNIQUE: Multidetector CT imaging of the neck was performed during bolus injection of intravenous contrast. Multiplanar CT angiographic image reconstructions including MIPs were generated to evaluate the extra-cranial carotid arteries.; Multidetector CT imaging of the brain was performed during bolus injection of intravenous contrast. Multiplanar CT angiographic image reconstructions including MIPs were generated to evaluate cerebral vasculature centered at the [place]. CONTRAST:  163mL OMNIPAQUE IOHEXOL 350 MG/ML SOLN COMPARISON:  Prior noncontrast CT from earlier the same day. FINDINGS: CTA NECK: Visualized aortic arch of normal caliber with normal 3 vessel morphology. Scattered calcified plaque within the arch itself and about the origin of the left subclavian artery without high-grade stenosis. Subclavian arteries are well opacified distally. RIGHT CAROTID SYSTEM: Right common carotid artery widely  patent from its origin to the bifurcation. Scattered eccentric calcified plaque about the bifurcation without high-grade stenosis. Right ICA patent from the bifurcation to the skullbase. Right external carotid artery and its branches within normal limits. No high-grade flow-limiting stenosis, dissection, or vascular occlusion identified within the right carotid artery system. LEFT CAROTID SYSTEM: Left common carotid artery patent from its origin to the bifurcation. Mild calcified plaque about the bifurcation without  high-grade stenosis. Minimal a centric plaque within the proximal left ICA without stenosis. Left ICA widely patent from its origin to the skullbase. Left external carotid artery and its branches within normal limits. No high-grade stenosis, dissection, or vascular occlusion within left carotid artery system. VERTEBRAL ARTERIES: Both vertebral arteries arise from the subclavian arteries. Vertebral arteries well opacified along their entire course without evidence for dissection, stenosis, or occlusion. SKELETON: No acute osseous abnormality. No worrisome lytic or blastic osseous lesions. Degenerative disc bulge noted at C3-4. SOFT TISSUES: Patient is intubated. Fluid within the hypopharynx and nasopharynx likely related intubation. Scattered subcentimeter nodules noted within the left lobe of thyroid, of doubtful clinical significance. No adenopathy or acute soft tissue abnormality within the neck. Mild asymmetric thickening along the right platysmas, which may be postsurgical in nature given scattered surgical clips within this region. Visualized superior mediastinum within normal limits. The visualized lungs are clear.  Mild centrilobular emphysema. CTA HEAD: ANTERIOR: The petrous, cavernous, and supraclinoid segments of both ICAs are widely patent without stenosis or occlusion. A1 segments patent. Anterior communicating artery normal. Anterior cerebral arteries well opacified to their distal aspects.  M1 segments patent without occlusion or high-grade stenosis. MCA bifurcations within normal limits. No proximal M2 branch occlusion. Distal MCA branches fairly symmetric and well opacified bilaterally. POSTERIOR: Vertebral arteries patent to the vertebrobasilar junction. Posterior inferior cerebral arteries patent bilaterally. Basilar artery mildly irregular without high-grade stenosis, and well opacified to its distal aspect. Superior cerebellar arteries patent. Both posterior cerebral arteries arise from the basilar artery and are well opacified to their distal aspects. Small right posterior communicating artery noted. VENOUS: Grossly patent without venous sinus thrombosis. VARIANTS: None.  No aneurysm. DELAYED: Not performed. Incidental note made of a small 1 cm meningioma overlying the left cerebral convexity (series 502, image 65). No significant mass effect. CT PERFUSION: No perfusion mismatch to suggest acute stroke or active ischemia identified. Relatively symmetric cerebral blood flow and cerebral blood volume, as well as mean transit time and time to peak present. No evidence for penumbra. IMPRESSION: CTA NECK IMPRESSION: 1. No critical or high-grade flow-limiting stenosis identified within the major arterial vasculature of the neck. 2. Fairly mild atheromatous plaque about the carotid bifurcations without significant stenosis. 3. Mild centrilobular emphysema. CTA HEAD IMPRESSION: 1. Negative intracranial CTA. No large or proximal arterial branch occlusion within the intracranial circulation. No high-grade or flow-limiting stenosis. 2. 1 cm meningioma overlying the left cerebral convexity without mass effect. CT PERFUSION IMPRESSION: Negative CT perfusion. No perfusion mismatch to suggest acute stroke or active ischemia. Critical Value/emergent results were called by telephone at the time of interpretation on 02/05/2016 at 12:14 am to Dr. Roland Rack , who verbally acknowledged these results.  Electronically Signed   By: Jeannine Boga M.D.   On: 02/05/2016 00:39   Dg Chest Port 1 View  02/04/2016  CLINICAL DATA:  Endotracheal tube adjustment. EXAM: PORTABLE CHEST 1 VIEW COMPARISON:  Earlier this day. FINDINGS: Endotracheal tube is now visualized 7.9 cm from the carina. Previous asymmetric aeration of the lungs is less well visualized on the current exam. Cardiomediastinal contours are normal. No pulmonary edema. Vague increased density in the right suprahilar lung. IMPRESSION: 1. Endotracheal tube 7.9 cm from the carina. 2. Decreasing asymmetric lung aeration. 3. Vague density in the right suprahilar lung, may simply represent overlapping osseous structures. When patient is able, recommend PA and lateral views. Electronically Signed   By: Jeb Levering M.D.   On: 02/04/2016 23:01  Dg Chest Portable 1 View  02/04/2016  CLINICAL DATA:  Shortness of breath.  Intubation. EXAM: PORTABLE CHEST 1 VIEW COMPARISON:  09/26/2014 FINDINGS: Endotracheal tube is not visualized. There is asymmetric lung aeration, right increased compared to the left with relative lucency of the right lung. No significant rotation is seen. Heart size and mediastinal contours are normal. No evidence of pulmonary edema, confluent airspace disease or large pleural effusion. IMPRESSION: 1. Endotracheal tube not visualized. Tube may be above the thoracic inlet not included in the field of view, however recommend repositioning and repeat exam. 2. Asymmetric lung aeration, with the right lung hyperaerated and with decreased density compared to the left. Preferential right mainstem intubation with subsequent retraction could produce this appearance. An anterior pneumothorax is not excluded. Continued follow-up is recommended. These results were called by telephone at the time of interpretation on 02/04/2016 at 9:41 pm to Dr. Fredia Sorrow , who verbally acknowledged these results. Patient is reportedly ventilating with normal  oxygen saturation and equal breath sounds. Electronically Signed   By: Jeb Levering M.D.   On: 02/04/2016 21:44   Dg Abd Portable 1v  02/05/2016  CLINICAL DATA:  Orogastric tube placement. EXAM: PORTABLE ABDOMEN - 1 VIEW COMPARISON:  None. FINDINGS: A size normal. Lung bases are clear. Side port of the NG tube is in the fundus stomach. The stomach is decompressed. The bowel gas pattern is normal. Contrast is noted within the renal collecting systems bilaterally. IMPRESSION: Satisfactory positioning of the orogastric tube. Electronically Signed   By: San Morelle M.D.   On: 02/05/2016 08:16     STUDIES:  Imaging as above.   CULTURES: None  ANTIBIOTICS: None  SIGNIFICANT EVENTS: Change in gaze preference; concern for stroke -> CTA head/neck  LINES/TUBES: PIV ETT 3/5 >>3/6 Foley 3/5>>  DISCUSSION: Ms. Lorraine Tapia is a 10F with reported history of alcohol abuse and psychiatric disorders who was found seizing earlier this evening. The etiology of her seizures is unclear. Her alcohol level was nil, raising the question of EtOH withdrawal seizures. Neurology is following. Imaging has thus far been unrevealing. No one is available to provide any additional history.   ASSESSMENT / PLAN:  PULMONARY A:Acute resp failure -Intubated for airway protection P:   Rapid SBts to extubate  CARDIOVASCULAR A:  No acute issues P:  n/a  RENAL A:   Mild AKI (Cr. 0.93 from 0.59) P:   Trend Cr IVF Avoid nephrotoxins  GASTROINTESTINAL A:   No acute issues P:   NPO Stress ulcer ppx  HEMATOLOGIC A:   No acute issues P:  n/a  INFECTIOUS A:   No acute issues P:   n/a  ENDOCRINE A:   No acute issues   P:   n/a  NEUROLOGIC A:   Seizures - etiology unclear; EtOH w/d vs other drug/toxin vs structural lesion Todds paralysis - resolved P:   Neurology following  ct keppra per their recs Follow up MRI EEG  dc propofol     FAMILY  - Updates: None  -  Inter-disciplinary family meet or Palliative Care meeting due by:  day 7   The patient is critically ill with multiple organ system failure and requires high complexity decision making for assessment and support, frequent evaluation and titration of therapies, advanced monitoring, review of radiographic studies and interpretation of complex data.   Critical Care Time devoted to patient care services, exclusive of separately billable procedures, described in this note is 35 minutes.   Kara Mead MD.  [facility]P.  Pulmonary & Critical care Pager [phone] If no response call [phone]   02/05/2016, 12:09 PM

## 2016-02-05 NOTE — Progress Notes (Signed)
eLink Physician-Brief Progress Note Patient Name: Lorraine Tapia DOB: 05-Sep-1951 MRN: [id number]   Date of Service  02/05/2016  HPI/Events of Note  65 year old female status post extubation this morning at 8:34 AM. Camera check on patient post extubation shows resting comfortably on room air saturating nearly 100%. No increased work of breathing. Appears to be sleeping at this time.  eICU Interventions  Continue close monitoring in the intensive care unit.     Intervention Category Major Interventions: Respiratory failure - evaluation and management  Tera Partridge 02/05/2016, 4:29 PM

## 2016-02-05 NOTE — ED Notes (Signed)
Attempted to call report

## 2016-02-06 ENCOUNTER — Inpatient Hospital Stay (HOSPITAL_COMMUNITY): Payer: Medicaid Other

## 2016-02-06 LAB — BASIC METABOLIC PANEL
ANION GAP: 10 (ref 5–15)
BUN: 7 mg/dL (ref 6–20)
CHLORIDE: 104 mmol/L (ref 101–111)
CO2: 20 mmol/L — AB (ref 22–32)
CREATININE: 0.75 mg/dL (ref 0.44–1.00)
Calcium: 8.7 mg/dL — ABNORMAL LOW (ref 8.9–10.3)
GFR calc non Af Amer: >60 mL/min (ref >60)
Glucose, Bld: 107 mg/dL — ABNORMAL HIGH (ref 65–99)
POTASSIUM: 3.7 mmol/L (ref 3.5–5.1)
Sodium: 134 mmol/L — ABNORMAL LOW (ref 135–145)

## 2016-02-06 LAB — CBC
HEMATOCRIT: 30 % — AB (ref 36.0–46.0)
HEMOGLOBIN: 10.2 g/dL — AB (ref 12.0–15.0)
MCH: 34.6 pg — AB (ref 26.0–34.0)
MCHC: 34 g/dL (ref 30.0–36.0)
MCV: 101.7 fL — AB (ref 78.0–100.0)
Platelets: 200 10*3/uL (ref 150–400)
RBC: 2.95 MIL/uL — AB (ref 3.87–5.11)
RDW: 13.7 % (ref 11.5–15.5)
WBC: 8.1 10*3/uL (ref 4.0–10.5)

## 2016-02-06 MED ORDER — PNEUMOCOCCAL VAC POLYVALENT 25 MCG/0.5ML IJ INJ
0.5000 mL | INJECTION | INTRAMUSCULAR | Status: AC
  Administered 2016-02-07: 0.5 mL via INTRAMUSCULAR
  Filled 2016-02-06: qty 0.5

## 2016-02-06 MED ORDER — LORAZEPAM 2 MG/ML IJ SOLN: 1.0000 mg | INTRAMUSCULAR | Status: DC | PRN

## 2016-02-06 NOTE — Care Management Note (Signed)
Case Management Note  Patient Details  Name: Lorraine Tapia MRN: [id number] Date of Birth: [birth year]  Subjective/Objective:                    Action/Plan: Patient was admitted following a witnessed seizure. Will follow for discharge needs pending PT/OT evals and physician orders.  Expected Discharge Date:                  Expected Discharge Plan:     In-House Referral:     Discharge planning Services     Post Acute Care Choice:    Choice offered to:     DME Arranged:    DME Agency:     HH Arranged:    HH Agency:     Status of Service:  In process, will continue to follow  Medicare Important Message Given:    Date Medicare IM Given:    Medicare IM give by:    Date Additional Medicare IM Given:    Additional Medicare Important Message give by:     If discussed at [place] of Stay Meetings, dates discussed:    Additional Comments:  Rolm Baptise, RN 02/06/2016, 11:26 AM [phone]

## 2016-02-06 NOTE — Progress Notes (Signed)
Attempted to get MRI. Pt restless, not laying still in MRI machine. PRN ativan given. Pt continued to move around, unable to get imaging.MRI rescheduled for day shift.

## 2016-02-06 NOTE — Progress Notes (Signed)
Pt received at this time stable, with no noted distress. Pt denies pain or discomfort. Foley removed prior to arrival. Pt oriented to room. Safety measures in place. Call bell within reach. Will continue to monitor.

## 2016-02-06 NOTE — Progress Notes (Signed)
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 EMS was called to her residence for seizure activity. She had a witnessed seizure en route to the hospital. She was noted to have rightward gaze deviation with nystagmus. She was intubated for airway protection and started on propofol. History is obtained from the chart and ED provider. No family is available.   Labs are notable for benign UA, normal glucose, mild AKI (Cr 0.98 from 0.59 10 months ago), Tbili 1.6.  Ct head was relatively unremarkable. CTA head and neck neg   SUBJ - Afebrile Denies pain No seizure activity Good UO   VITAL SIGNS: Temp:  [97.9 F (36.6 C)-99.9 F (37.7 C)] 97.9 F (36.6 C) (03/07 0700) Pulse Rate:  [58-113] 62 (03/07 0700) Resp:  [14-32] 18 (03/07 0700) BP: (78-151)/(42-92) 113/65 mmHg (03/07 0700) SpO2:  [94 %-100 %] 99 % (03/07 0700) FiO2 (%):  [40 %] 40 % (03/06 0830) Weight:  [71.4 kg (157 lb 6.5 oz)] 71.4 kg (157 lb 6.5 oz) (03/07 0700)  HEMODYNAMICS:    VENTILATOR SETTINGS: Vent Mode:  [-] PSV;CPAP FiO2 (%):  [40 %] 40 % PEEP:  [5 cmH20] 5 cmH20 Pressure Support:  [5 cmH20] 5 cmH20  INTAKE / OUTPUT: I/O last 3 completed shifts: In: 5847.8 [P.O.:1700; I.V.:3837.8; IV Piggyback:310] Out: 2735 [Urine:2735]  PHYSICAL EXAMINATION: General:  Thin, well-developed AAF Neuro:  Awake, interactive HEENT:  Post-surgical / post-radiation changes to face/cheek. Edentulous.  Cardiovascular:  Regular rhythm, normal rate, s1, s2, no m/r/g. Distal pulses palpable.  Lungs:  Coarse bilaterally, but generally clear to auscultation  without wheezes, rales or ronchi. Vent-assisted effort. Symmetrical expansion Abdomen:  Soft, non-distended, +bs, no organomegaly or mass. Unable to assess tenderness Musculoskeletal:  No bony deformities.  Skin:  Dry, intact. No rashes / sores / ulcers.   LABS:  BMET  Recent Labs Lab 02/04/16 2000 02/05/16 0226 02/06/16 0317  NA 142 141 134*  K 3.7 4.5 3.7  CL 103 106 104  CO2 26 20* 20*  BUN 11 14 7   CREATININE 0.93 0.89  0.94 0.75  GLUCOSE 165* 73 107*    Electrolytes  Recent Labs Lab 02/04/16 2000 02/05/16 0226 02/06/16 0317  CALCIUM 10.0 9.4 8.7*  MG  --  2.0  --   PHOS  --  5.0*  --     CBC  Recent Labs Lab 02/04/16 2000 02/05/16 0226 02/06/16 0317  WBC 9.6 10.3 8.1  HGB 12.1 11.8* 10.2*  HCT 35.3* 34.7* 30.0*  PLT 280 207 200    Coag's No results for input(s): APTT, INR in the last 168 hours.  Sepsis Markers No results for input(s): LATICACIDVEN, PROCALCITON, O2SATVEN in the last 168 hours.  ABG  Recent Labs Lab 02/05/16 0233  PHART 7.383  PCO2ART 40.6  PO2ART 135.0*    Liver Enzymes  Recent Labs Lab 02/04/16 2000  AST 34  ALT 17  ALKPHOS 58  BILITOT 1.6*  ALBUMIN 4.2    Cardiac Enzymes No results for input(s): TROPONINI, PROBNP in the last 168 hours.  Glucose  Recent Labs Lab 02/04/16 2113  GLUCAP 111*    Imaging  No results found.   STUDIES:  Imaging as above.   CULTURES: None  ANTIBIOTICS: None  SIGNIFICANT EVENTS: Change in gaze preference; concern for stroke -> CTA head/neck  LINES/TUBES: PIV ETT 3/5 >>3/6 Foley 3/5>>  DISCUSSION: Lorraine Tapia is [name] with reported history of alcohol abuse and psychiatric disorders who was found seizing earlier this evening. The etiology of her seizures is unclear. Her alcohol level was nil, raising the question of EtOH withdrawal seizures. Neurology is following. Imaging has thus far been unrevealing. No one is available to provide any additional history.    ASSESSMENT / PLAN:  PULMONARY A:Acute resp failure -Intubated for airway protection P:    extubated  CARDIOVASCULAR A:  No acute issues P:  n/a  RENAL A:   Mild AKI (Cr. 0.93 from 0.59) -resolved hyponatremia P:    Avoid nephrotoxins  GASTROINTESTINAL A:   No acute issues P:   Advance PO   NEUROLOGIC A:   Seizures - etiology unclear; EtOH w/d vs other drug/toxin vs structural lesion Todds paralysis - resolved EEG - left temporal slowing P:   Neurology following  ct keppra per their recs Follow up MRI W/f withdrawal     FAMILY  - Updates: None  - Inter-disciplinary family meet or Palliative Care meeting due by:  day 7   Transfer to floor & [facility] 3/8  Lorraine Mead MD. [facility]. [facility] Pager [phone] If no response call [phone]   02/06/2016, 8:07 AM

## 2016-02-06 NOTE — Progress Notes (Signed)
Subjective: Patient had no complaints. No recurrence of seizure activity reported. MRI could not be obtained because of agitation, which did not improve significantly with Ativan.  Objective: Current vital signs: BP 106/72 mmHg  Pulse 61  Temp(Src) 98.1 F (36.7 C) (Core (Comment))  Resp 19  Ht 5\' 9"  (1.753 m)  Wt 71.4 kg (157 lb 6.5 oz)  BMI 23.23 kg/m2  SpO2 100%  Neurologic Exam: Patient was alert and in no acute distress. She was well oriented to correct age as well as place. She did not know the current month, however. Extraocular movements were full and conjugate. Speech was normal. Patient moved extremities equally with normal strength throughout. Coordination was normal.  Medications: I have reviewed the patient's current medications.  EEG on 02/05/2016 showed intermittent left temporal slowing. No epileptiform discharges were recorded.  Assessment/Plan: 65 year old lady admitted following recurrent generalized seizures of new onset, possibly related to alcohol withdrawal. Patient had focal weakness as well which has resolved. 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 Significance of intermittent left temporal slowing on EEG is unclear but may be due to postictal state. Repeat EEG study will be obtained to see [place] focal slowing resolved. MRI of the brain will be canceled.  We will continue to follow this patient with you.  C.R. Nicole Kindred, MD [facility] [phone]  02/06/2016  9:20 AM

## 2016-02-07 DIAGNOSIS — Z716 Tobacco abuse counseling: Secondary | ICD-10-CM

## 2016-02-07 DIAGNOSIS — F1010 Alcohol abuse, uncomplicated: Secondary | ICD-10-CM

## 2016-02-07 DIAGNOSIS — F319 Bipolar disorder, unspecified: Secondary | ICD-10-CM

## 2016-02-07 DIAGNOSIS — G9340 Encephalopathy, unspecified: Secondary | ICD-10-CM | POA: Diagnosis present

## 2016-02-07 MED ORDER — LEVETIRACETAM 500 MG PO TABS: 500.0000 mg | ORAL_TABLET | Freq: Two times a day (BID) | ORAL | Status: DC

## 2016-02-07 MED ORDER — ADULT MULTIVITAMIN W/MINERALS CH: 1.0000 | ORAL_TABLET | Freq: Every day | ORAL | Status: DC

## 2016-02-07 NOTE — Discharge Instructions (Signed)
Seizure, Adult °A seizure means there is unusual activity in the brain. A seizure can cause changes in attention or behavior. Seizures often cause shaking (convulsions). Seizures often last from 30 seconds to 2 minutes. °HOME CARE  °· If you are given medicines, take them exactly as told by your doctor. °· Keep all doctor visits as told. °· Do not swim or drive until your doctor says it is okay. °· Teach others what to do if you have a seizure. They should: °¨ Lay you on the ground. °¨ Put a cushion under your head. °¨ Loosen any tight clothing around your neck. °¨ Turn you on your side. °¨ Stay with you until you get better. °GET HELP RIGHT AWAY IF:  °· The seizure lasts longer than 2 to 5 minutes. °· The seizure is very bad. °· The person does not wake up after the seizure. °· The person's attention or behavior changes. °Drive the person to the emergency room or call your local emergency services (911 in [place] °MAKE SURE YOU:  °· Understand these instructions. °· Will watch your condition. °· Will get help right away if you are not doing well or get worse. °  °This information is not intended to replace advice given to you by your health care provider. Make sure you discuss any questions you have with your health care provider. °  °Document Released: 05/06/2008 Document Revised: 02/10/2012 Document Reviewed: 06/30/2013 °Elsevier Interactive Patient Education ©2016 [employer] ° °

## 2016-02-07 NOTE — Evaluation (Signed)
Physical Therapy Evaluation Patient Details Name: Lorraine Tapia MRN: [id number] DOB: 1951/04/14 Today's Date: 02/07/2016   History of Present Illness  65 year old lady admitted following seizure at her residence and another seizure en route to the hospital.  She was noted to have rightward gaze w/ nystagmus.  She was intubated for airway protection and extubated on 3/6.Patient had focal weakness as well which has resolved. 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 CT negative.  Significance of intermittent left temporal slowing on EEG is unclear but may be due to postictal state. Pt's PMH includes depression, bipolar disorder, prior malignancy (buccal cancer) s/p resection/reconstruction, alcohol and tobacco abuse.      Clinical Impression  Pt admitted with above diagnosis. Pt currently with functional limitations due to the deficits listed below (see PT Problem List). Lorraine Tapia presents w/ cognitive and balance impairments and decreased strength.  She currently requires min guard assist for safe ambulation due to drifting to the Rt and instability w/ high level balance activities.  She reports she is from a [place] and that she is never at home alone but unable to provide name of Group Home.  She provides an invalid address in [place].  Recommend pt to return to [facility] as long as 24/7 supervision is available, otherwise pt will likely need SNF level of care as pt is a fall risk.  Pt will benefit from skilled PT to increase their independence and safety with mobility to allow discharge to the venue listed below.      Follow Up Recommendations Home health PT;Supervision for mobility/OOB    Equipment Recommendations  None recommended by PT    Recommendations for Other Services       Precautions / Restrictions Precautions Precautions: Fall Precaution Comments: seizure precautions Restrictions Weight Bearing Restrictions: No      Mobility  Bed Mobility                General bed mobility comments: Pt sitting in recliner chair upon PT arrival  Transfers Overall transfer level: Needs assistance Equipment used: None Transfers: Sit to/from Stand Sit to Stand: Supervision         General transfer comment: Min instability upon standing but no outside assist needed.  Supervision for safety.  Ambulation/Gait Ambulation/Gait assistance: Min guard Ambulation Distance (Feet): 250 Feet Assistive device: None Gait Pattern/deviations: Drifts right/left;Narrow base of support;Step-through pattern   Gait velocity interpretation: Below normal speed for age/gender General Gait Details: Pt drifts to the Rt and requires cues to ambulate in middle of hallway.  Instability but no LOB or need for outside support.  Close min guard assist for safety.    Stairs Stairs: Yes Stairs assistance: Min guard Stair Management: Two rails;Forwards;Step to pattern;Alternating pattern Number of Stairs: 5 General stair comments: Alternating steps w/ ascent and step to pattern w/ descent. Unsteady but no LOB or outside assist needed.  Wheelchair Mobility    Modified Rankin (Stroke Patients Only)       Balance Overall balance assessment: Needs assistance Sitting-balance support: No upper extremity supported;Feet supported Sitting balance-Leahy Scale: Good     Standing balance support: No upper extremity supported;During functional activity Standing balance-Leahy Scale: Fair               High level balance activites: Sudden stops;Head turns;Backward walking;Other (comment) (stepping over object) High Level Balance Comments: Pt attempt to look at ceiling when asked to but says, "I can't do that, it makes me want to fall".  Instability noted w/ walking backwards.             Pertinent Vitals/Pain Pain Assessment: No/denies pain    Home Living Family/patient expects to be discharged to:: Group home Living Arrangements:  Non-relatives/Friends Available Help at Discharge: Friend(s);Available 24 hours/day (Housemates) Type of Home: Group Home Home Access: Ramped entrance     Home Layout: Two level;Able to live on main level with bedroom/bathroom (ramp from outside to 2nd floor where she lives) Home Equipment: None Additional Comments: Pt cannot recall the name of the [facility] where she lives.  She reports the address as [place] in [place] but cannot find any connection in [place].  She says that [employer] her up to live in this group home and she has been doing so for the past year and a half.  She says she is never alone at home.    Prior Function Level of Independence: Independent               Hand Dominance        Extremity/Trunk Assessment   Upper Extremity Assessment: Overall WFL for tasks assessed           Lower Extremity Assessment: RLE deficits/detail;LLE deficits/detail RLE Deficits / Details: strength grossly 4/5 LLE Deficits / Details: strength grossly 4/5     Communication   Communication: Other (comment) (slurred due to h/o buccal cancer w/ resection/reconstruction)  Cognition Arousal/Alertness: Awake/alert Behavior During Therapy: WFL for tasks assessed/performed Overall Cognitive Status: No family/caregiver present to determine baseline cognitive functioning Area of Impairment: Orientation;Memory;Safety/judgement;Awareness;Problem solving Orientation Level: Disoriented to;Time;Situation   Memory: Decreased short-term memory   Safety/Judgement: Decreased awareness of safety;Decreased awareness of deficits Awareness: Emergent Problem Solving: Slow processing;Requires verbal cues General Comments: Says she's in the hospital for a heart attack and that it is February.  Pt unable to recall name of group home where she lives.  She appears unaware of her unsteadiness while ambulating.    General Comments      Exercises         Assessment/Plan    PT Assessment Patient needs continued PT services  PT Diagnosis Difficulty walking   PT Problem List Decreased strength;Decreased balance;Decreased coordination;Decreased cognition;Decreased safety awareness  PT Treatment Interventions DME instruction;Gait training;Functional mobility training;Stair training;Therapeutic activities;Therapeutic exercise;Balance training;Cognitive remediation;Patient/family education   PT Goals (Current goals can be found in the Care Plan section) Acute Rehab PT Goals Patient Stated Goal: to go back to group home PT Goal Formulation: With patient Time For Goal Achievement: 02/16/16 Potential to Achieve Goals: Good    Frequency Min 3X/week   Barriers to discharge        Co-evaluation               End of Session Equipment Utilized During Treatment: Gait belt Activity Tolerance: Patient tolerated treatment well Patient left: in chair;with call bell/phone within reach;with chair alarm set Nurse Communication: Mobility status         Time: [id number] PT Time Calculation (min) (ACUTE ONLY): 36 min   Charges:   PT Evaluation $PT Eval Moderate Complexity: 1 Procedure PT Treatments $Gait Training: 8-22 mins   PT G Codes:       Collie Siad PT, DPT  Pager: [phone] Phone: [phone] 02/07/2016, 3:50 PM

## 2016-02-07 NOTE — Discharge Summary (Signed)
Physician Discharge Summary  Lorraine Tapia [id number] DOB: Jan 03, 1951 DOA: 02/04/2016  PCP: Minerva Ends, MD  Admit date: 02/04/2016 Discharge date: 02/07/2016  Time spent: 25 minutes  Recommendations for Outpatient Follow-up:  1. Discharged back to group home with home health PT 2. Recommend repeat EEG in 4 weeks to see for resolution of focal left temporal slowing seen during hospitalization.   Discharge Diagnoses:  Principal problem Acute generalized seizures  Active Problems:     Acute respiratory failure (HCC)   Acute encephalopathy   Bipolar disorder with psychotic features ([place])   ETOH abuse   Tobacco abuse     Discharge Condition: Fair  Diet recommendation: Regular  CODE STATUS: Full code  [facility] Weights   02/05/16 0312 02/06/16 0700 02/07/16 0451  Weight: 67.9 kg (149 lb 11.1 oz) 71.4 kg (157 lb 6.5 oz) 75.978 kg (167 lb 8 oz)    History of present illness:  Please refer to admission H&P for details, in brief, 65 year old female with history of bipolar disease/depression, 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 EMS was called and brought her to the hospital. She had a witnessed seizure and now to the hospital and was found to have a rightward gaze deviation with nystagmus. She was intubated for airway protection and started on propofol and admitted to ICU. CT head, CT angiogram of the head and neck were unremarkable. Blood work showed total bili of 1.6. Alcohol level was negative.   Hospital Course:  Acute generalized seizure First episode. Suspect due to alcohol withdrawal. Patient reports drinking 1-2 beers daily and a bottle of wine every week (on the days she gets her paycheck).  No further seizure activity. EEG done showed intermittent left temporal slowing. Patient followed by neurology. Recommends to start her on Keppra  500 mg twice daily. Recommends repeat EEG to see for resolution of the left temporal slowing. Discussed with Dr. Nicole Kindred today and recommends that this can be done as outpatient. Patient seen by PT and recommends and discharged back to group home. Will arrange home health PT at the facility. Patient counseled strongly on alcohol cessation.  Acute encephalopathy secondary to seizures. Resolved.  Alcohol and tobacco abuse Counseled strongly on cessation.  Bipolar disorder Receives Haldol injection every 28 days.  Hyperlipidemia Continue statin  Iron deficiency anemia Continue iron supplements   Procedures:  Head CT  CT angiogram of the head and neck  EEG  Consultations:  PC CM  Neurology  Discharge Exam: Filed Vitals:   02/07/16 1000 02/07/16 1438  BP: 109/73 143/85  Pulse: 81 56  Temp: 98.2 F (36.8 C) 97.7 F (36.5 C)  Resp: 18 18    General: Related female not in distress HEENT: No pallor, moist mucosa, swollen right lower lip (chronic from her buccal CA) Cardiovascular: S1 and S2, no murmurs Chest: Clear bilaterally GI: Soft, nondistended, nontender Musculoskeletal: Warm, no edema CNS: Alert and oriented  Discharge Instructions    Current Discharge Medication List    START taking these medications   Details  levETIRAcetam (KEPPRA) 500 MG tablet Take 1 tablet (500 mg total) by mouth 2 (two) times daily. Qty: 60 tablet, Refills: 0    Multiple Vitamin (MULTIVITAMIN WITH MINERALS) TABS tablet Take 1 tablet by mouth daily. Qty: 30 tablet, Refills: 0      CONTINUE these medications which have NOT CHANGED   Details  atorvastatin (LIPITOR)  20 MG tablet Take 1 tablet (20 mg total) by mouth daily. Qty: 90 tablet, Refills: 3   Associated Diagnoses: Essential hypertension    chlorthalidone (HYGROTON) 25 MG tablet Take 1 tablet (25 mg total) by mouth daily. Qty: 30 tablet, Refills: 6   Associated Diagnoses: Essential hypertension    ferrous sulfate  325 (65 FE) MG tablet Take 1 tablet (325 mg total) by mouth daily with breakfast. Qty: 60 tablet, Refills: 3   Associated Diagnoses: RBC microcytosis    haloperidol decanoate (HALDOL DECANOATE) 50 MG/ML injection Inject 200 mg into the muscle every 30 (thirty) days. Last dose 01/26/14    Vitamin D, Ergocalciferol, (DRISDOL) 50000 UNITS CAPS capsule Take 1 capsule (50,000 Units total) by mouth every 7 (seven) days. For 12 weeks Qty: 12 capsule, Refills: 0   Associated Diagnoses: Vitamin D deficiency       Allergies  Allergen Reactions  . Other Itching    Rash and itching with pepper   Follow-up Information    Follow up with Minerva Ends, MD. Schedule an appointment as soon as possible for a visit in 1 week.   Specialty:  Family Medicine   Contact information:   [place] [id number] [phone]        The results of significant diagnostics from this hospitalization (including imaging, microbiology, ancillary and laboratory) are listed below for reference.    Significant Diagnostic Studies: Ct Angio Head W/cm &/or Wo Cm  02/05/2016  CLINICAL DATA:  Initial evaluation for acute right-sided weakness. EXAM: CT CEREBRAL PERFUSION WITH CONTRAST; CT ANGIOGRAPHY NECK; CT ANGIOGRAPHY HEAD TECHNIQUE: Multidetector CT imaging of the neck was performed during bolus injection of intravenous contrast. Multiplanar CT angiographic image reconstructions including MIPs were generated to evaluate the extra-cranial carotid arteries.; Multidetector CT imaging of the brain was performed during bolus injection of intravenous contrast. Multiplanar CT angiographic image reconstructions including MIPs were generated to evaluate cerebral vasculature centered at the [place] of [place]. CONTRAST:  179mL OMNIPAQUE IOHEXOL 350 MG/ML SOLN COMPARISON:  Prior noncontrast CT from earlier the same day. FINDINGS: CTA NECK: Visualized aortic arch of normal caliber with normal 3 vessel morphology.  Scattered calcified plaque within the arch itself and about the origin of the left subclavian artery without high-grade stenosis. Subclavian arteries are well opacified distally. RIGHT CAROTID SYSTEM: Right common carotid artery widely patent from its origin to the bifurcation. Scattered eccentric calcified plaque about the bifurcation without high-grade stenosis. Right ICA patent from the bifurcation to the skullbase. Right external carotid artery and its branches within normal limits. No high-grade flow-limiting stenosis, dissection, or vascular occlusion identified within the right carotid artery system. LEFT CAROTID SYSTEM: Left common carotid artery patent from its origin to the bifurcation. Mild calcified plaque about the bifurcation without high-grade stenosis. Minimal a centric plaque within the proximal left ICA without stenosis. Left ICA widely patent from its origin to the skullbase. Left external carotid artery and its branches within normal limits. No high-grade stenosis, dissection, or vascular occlusion within left carotid artery system. VERTEBRAL ARTERIES: Both vertebral arteries arise from the subclavian arteries. Vertebral arteries well opacified along their entire course without evidence for dissection, stenosis, or occlusion. SKELETON: No acute osseous abnormality. No worrisome lytic or blastic osseous lesions. Degenerative disc bulge noted at C3-4. SOFT TISSUES: Patient is intubated. Fluid within the hypopharynx and nasopharynx likely related intubation. Scattered subcentimeter nodules noted within the left lobe of thyroid, of doubtful clinical significance. No adenopathy or acute soft tissue  abnormality within the neck. Mild asymmetric thickening along the right platysmas, which may be postsurgical in nature given scattered surgical clips within this region. Visualized superior mediastinum within normal limits. The visualized lungs are clear.  Mild centrilobular emphysema. CTA HEAD: ANTERIOR:  The petrous, cavernous, and supraclinoid segments of both ICAs are widely patent without stenosis or occlusion. A1 segments patent. Anterior communicating artery normal. Anterior cerebral arteries well opacified to their distal aspects. M1 segments patent without occlusion or high-grade stenosis. MCA bifurcations within normal limits. No proximal M2 branch occlusion. Distal MCA branches fairly symmetric and well opacified bilaterally. POSTERIOR: Vertebral arteries patent to the vertebrobasilar junction. Posterior inferior cerebral arteries patent bilaterally. Basilar artery mildly irregular without high-grade stenosis, and well opacified to its distal aspect. Superior cerebellar arteries patent. Both posterior cerebral arteries arise from the basilar artery and are well opacified to their distal aspects. Small right posterior communicating artery noted. VENOUS: Grossly patent without venous sinus thrombosis. VARIANTS: None.  No aneurysm. DELAYED: Not performed. Incidental note made of a small 1 cm meningioma overlying the left cerebral convexity (series 502, image 65). No significant mass effect. CT PERFUSION: No perfusion mismatch to suggest acute stroke or active ischemia identified. Relatively symmetric cerebral blood flow and cerebral blood volume, as well as mean transit time and time to peak present. No evidence for penumbra. IMPRESSION: CTA NECK IMPRESSION: 1. No critical or high-grade flow-limiting stenosis identified within the major arterial vasculature of the neck. 2. Fairly mild atheromatous plaque about the carotid bifurcations without significant stenosis. 3. Mild centrilobular emphysema. CTA HEAD IMPRESSION: 1. Negative intracranial CTA. No large or proximal arterial branch occlusion within the intracranial circulation. No high-grade or flow-limiting stenosis. 2. 1 cm meningioma overlying the left cerebral convexity without mass effect. CT PERFUSION IMPRESSION: Negative CT perfusion. No perfusion  mismatch to suggest acute stroke or active ischemia. Critical Value/emergent results were called by telephone at the time of interpretation on 02/05/2016 at 12:14 am to Dr. Roland Rack , who verbally acknowledged these results. Electronically Signed   By: Jeannine Boga M.D.   On: 02/05/2016 00:39   Ct Head Wo Contrast  02/04/2016  CLINICAL DATA:  Patient was found unresponsive and having seizures. Possible drug related. EXAM: CT HEAD WITHOUT CONTRAST TECHNIQUE: Contiguous axial images were obtained from the base of the skull through the vertex without intravenous contrast. COMPARISON:  11/12/2014 FINDINGS: Mild cerebral atrophy. Mild patchy low-attenuation change in the deep white matter may indicate small vessel ischemia. Borderline for ventricular dilatation. No mass effect or midline shift. No abnormal extra-axial fluid collections. Gray-white matter junctions are distinct. Basal cisterns are not effaced. No evidence of acute intracranial hemorrhage. No depressed skull fractures. Visualized paranasal sinuses and mastoid air cells are not opacified. IMPRESSION: No acute intracranial abnormalities. Mild chronic atrophy and small vessel ischemic changes suggested. Electronically Signed   By: Lucienne Capers M.D.   On: 02/04/2016 22:32   Ct Angio Neck W/cm &/or Wo/cm  02/05/2016  CLINICAL DATA:  Initial evaluation for acute right-sided weakness. EXAM: CT CEREBRAL PERFUSION WITH CONTRAST; CT ANGIOGRAPHY NECK; CT ANGIOGRAPHY HEAD TECHNIQUE: Multidetector CT imaging of the neck was performed during bolus injection of intravenous contrast. Multiplanar CT angiographic image reconstructions including MIPs were generated to evaluate the extra-cranial carotid arteries.; Multidetector CT imaging of the brain was performed during bolus injection of intravenous contrast. Multiplanar CT angiographic image reconstructions including MIPs were generated to evaluate cerebral vasculature centered at the Circle of  [place]. CONTRAST:  146mL OMNIPAQUE IOHEXOL  350 MG/ML SOLN COMPARISON:  Prior noncontrast CT from earlier the same day. FINDINGS: CTA NECK: Visualized aortic arch of normal caliber with normal 3 vessel morphology. Scattered calcified plaque within the arch itself and about the origin of the left subclavian artery without high-grade stenosis. Subclavian arteries are well opacified distally. RIGHT CAROTID SYSTEM: Right common carotid artery widely patent from its origin to the bifurcation. Scattered eccentric calcified plaque about the bifurcation without high-grade stenosis. Right ICA patent from the bifurcation to the skullbase. Right external carotid artery and its branches within normal limits. No high-grade flow-limiting stenosis, dissection, or vascular occlusion identified within the right carotid artery system. LEFT CAROTID SYSTEM: Left common carotid artery patent from its origin to the bifurcation. Mild calcified plaque about the bifurcation without high-grade stenosis. Minimal a centric plaque within the proximal left ICA without stenosis. Left ICA widely patent from its origin to the skullbase. Left external carotid artery and its branches within normal limits. No high-grade stenosis, dissection, or vascular occlusion within left carotid artery system. VERTEBRAL ARTERIES: Both vertebral arteries arise from the subclavian arteries. Vertebral arteries well opacified along their entire course without evidence for dissection, stenosis, or occlusion. SKELETON: No acute osseous abnormality. No worrisome lytic or blastic osseous lesions. Degenerative disc bulge noted at C3-4. SOFT TISSUES: Patient is intubated. Fluid within the hypopharynx and nasopharynx likely related intubation. Scattered subcentimeter nodules noted within the left lobe of thyroid, of doubtful clinical significance. No adenopathy or acute soft tissue abnormality within the neck. Mild asymmetric thickening along the right platysmas, which may be  postsurgical in nature given scattered surgical clips within this region. Visualized superior mediastinum within normal limits. The visualized lungs are clear.  Mild centrilobular emphysema. CTA HEAD: ANTERIOR: The petrous, cavernous, and supraclinoid segments of both ICAs are widely patent without stenosis or occlusion. A1 segments patent. Anterior communicating artery normal. Anterior cerebral arteries well opacified to their distal aspects. M1 segments patent without occlusion or high-grade stenosis. MCA bifurcations within normal limits. No proximal M2 branch occlusion. Distal MCA branches fairly symmetric and well opacified bilaterally. POSTERIOR: Vertebral arteries patent to the vertebrobasilar junction. Posterior inferior cerebral arteries patent bilaterally. Basilar artery mildly irregular without high-grade stenosis, and well opacified to its distal aspect. Superior cerebellar arteries patent. Both posterior cerebral arteries arise from the basilar artery and are well opacified to their distal aspects. Small right posterior communicating artery noted. VENOUS: Grossly patent without venous sinus thrombosis. VARIANTS: None.  No aneurysm. DELAYED: Not performed. Incidental note made of a small 1 cm meningioma overlying the left cerebral convexity (series 502, image 65). No significant mass effect. CT PERFUSION: No perfusion mismatch to suggest acute stroke or active ischemia identified. Relatively symmetric cerebral blood flow and cerebral blood volume, as well as mean transit time and time to peak present. No evidence for penumbra. IMPRESSION: CTA NECK IMPRESSION: 1. No critical or high-grade flow-limiting stenosis identified within the major arterial vasculature of the neck. 2. Fairly mild atheromatous plaque about the carotid bifurcations without significant stenosis. 3. Mild centrilobular emphysema. CTA HEAD IMPRESSION: 1. Negative intracranial CTA. No large or proximal arterial branch occlusion within the  intracranial circulation. No high-grade or flow-limiting stenosis. 2. 1 cm meningioma overlying the left cerebral convexity without mass effect. CT PERFUSION IMPRESSION: Negative CT perfusion. No perfusion mismatch to suggest acute stroke or active ischemia. Critical Value/emergent results were called by telephone at the time of interpretation on 02/05/2016 at 12:14 am to Dr. Roland Rack , who verbally acknowledged these results.  Electronically Signed   By: Jeannine Boga M.D.   On: 02/05/2016 00:39   Ct Cerebral Perfusion W/cm  02/05/2016  CLINICAL DATA:  Initial evaluation for acute right-sided weakness. EXAM: CT CEREBRAL PERFUSION WITH CONTRAST; CT ANGIOGRAPHY NECK; CT ANGIOGRAPHY HEAD TECHNIQUE: Multidetector CT imaging of the neck was performed during bolus injection of intravenous contrast. Multiplanar CT angiographic image reconstructions including MIPs were generated to evaluate the extra-cranial carotid arteries.; Multidetector CT imaging of the brain was performed during bolus injection of intravenous contrast. Multiplanar CT angiographic image reconstructions including MIPs were generated to evaluate cerebral vasculature centered at the Circle of [place]. CONTRAST:  140mL OMNIPAQUE IOHEXOL 350 MG/ML SOLN COMPARISON:  Prior noncontrast CT from earlier the same day. FINDINGS: CTA NECK: Visualized aortic arch of normal caliber with normal 3 vessel morphology. Scattered calcified plaque within the arch itself and about the origin of the left subclavian artery without high-grade stenosis. Subclavian arteries are well opacified distally. RIGHT CAROTID SYSTEM: Right common carotid artery widely patent from its origin to the bifurcation. Scattered eccentric calcified plaque about the bifurcation without high-grade stenosis. Right ICA patent from the bifurcation to the skullbase. Right external carotid artery and its branches within normal limits. No high-grade flow-limiting stenosis, dissection, or  vascular occlusion identified within the right carotid artery system. LEFT CAROTID SYSTEM: Left common carotid artery patent from its origin to the bifurcation. Mild calcified plaque about the bifurcation without high-grade stenosis. Minimal a centric plaque within the proximal left ICA without stenosis. Left ICA widely patent from its origin to the skullbase. Left external carotid artery and its branches within normal limits. No high-grade stenosis, dissection, or vascular occlusion within left carotid artery system. VERTEBRAL ARTERIES: Both vertebral arteries arise from the subclavian arteries. Vertebral arteries well opacified along their entire course without evidence for dissection, stenosis, or occlusion. SKELETON: No acute osseous abnormality. No worrisome lytic or blastic osseous lesions. Degenerative disc bulge noted at C3-4. SOFT TISSUES: Patient is intubated. Fluid within the hypopharynx and nasopharynx likely related intubation. Scattered subcentimeter nodules noted within the left lobe of thyroid, of doubtful clinical significance. No adenopathy or acute soft tissue abnormality within the neck. Mild asymmetric thickening along the right platysmas, which may be postsurgical in nature given scattered surgical clips within this region. Visualized superior mediastinum within normal limits. The visualized lungs are clear.  Mild centrilobular emphysema. CTA HEAD: ANTERIOR: The petrous, cavernous, and supraclinoid segments of both ICAs are widely patent without stenosis or occlusion. A1 segments patent. Anterior communicating artery normal. Anterior cerebral arteries well opacified to their distal aspects. M1 segments patent without occlusion or high-grade stenosis. MCA bifurcations within normal limits. No proximal M2 branch occlusion. Distal MCA branches fairly symmetric and well opacified bilaterally. POSTERIOR: Vertebral arteries patent to the vertebrobasilar junction. Posterior inferior cerebral arteries  patent bilaterally. Basilar artery mildly irregular without high-grade stenosis, and well opacified to its distal aspect. Superior cerebellar arteries patent. Both posterior cerebral arteries arise from the basilar artery and are well opacified to their distal aspects. Small right posterior communicating artery noted. VENOUS: Grossly patent without venous sinus thrombosis. VARIANTS: None.  No aneurysm. DELAYED: Not performed. Incidental note made of a small 1 cm meningioma overlying the left cerebral convexity (series 502, image 65). No significant mass effect. CT PERFUSION: No perfusion mismatch to suggest acute stroke or active ischemia identified. Relatively symmetric cerebral blood flow and cerebral blood volume, as well as mean transit time and time to peak present. No evidence for penumbra. IMPRESSION: CTA  NECK IMPRESSION: 1. No critical or high-grade flow-limiting stenosis identified within the major arterial vasculature of the neck. 2. Fairly mild atheromatous plaque about the carotid bifurcations without significant stenosis. 3. Mild centrilobular emphysema. CTA HEAD IMPRESSION: 1. Negative intracranial CTA. No large or proximal arterial branch occlusion within the intracranial circulation. No high-grade or flow-limiting stenosis. 2. 1 cm meningioma overlying the left cerebral convexity without mass effect. CT PERFUSION IMPRESSION: Negative CT perfusion. No perfusion mismatch to suggest acute stroke or active ischemia. Critical Value/emergent results were called by telephone at the time of interpretation on 02/05/2016 at 12:14 am to Dr. Roland Rack , who verbally acknowledged these results. Electronically Signed   By: Jeannine Boga M.D.   On: 02/05/2016 00:39   Dg Chest Port 1 View  02/04/2016  CLINICAL DATA:  Endotracheal tube adjustment. EXAM: PORTABLE CHEST 1 VIEW COMPARISON:  Earlier this day. FINDINGS: Endotracheal tube is now visualized 7.9 cm from the carina. Previous asymmetric  aeration of the lungs is less well visualized on the current exam. Cardiomediastinal contours are normal. No pulmonary edema. Vague increased density in the right suprahilar lung. IMPRESSION: 1. Endotracheal tube 7.9 cm from the carina. 2. Decreasing asymmetric lung aeration. 3. Vague density in the right suprahilar lung, may simply represent overlapping osseous structures. When patient is able, recommend PA and lateral views. Electronically Signed   By: Jeb Levering M.D.   On: 02/04/2016 23:01   Dg Chest Portable 1 View  02/04/2016  CLINICAL DATA:  Shortness of breath.  Intubation. EXAM: PORTABLE CHEST 1 VIEW COMPARISON:  09/26/2014 FINDINGS: Endotracheal tube is not visualized. There is asymmetric lung aeration, right increased compared to the left with relative lucency of the right lung. No significant rotation is seen. Heart size and mediastinal contours are normal. No evidence of pulmonary edema, confluent airspace disease or large pleural effusion. IMPRESSION: 1. Endotracheal tube not visualized. Tube may be above the thoracic inlet not included in the field of view, however recommend repositioning and repeat exam. 2. Asymmetric lung aeration, with the right lung hyperaerated and with decreased density compared to the left. Preferential right mainstem intubation with subsequent retraction could produce this appearance. An anterior pneumothorax is not excluded. Continued follow-up is recommended. These results were called by telephone at the time of interpretation on 02/04/2016 at 9:41 pm to Dr. Fredia Sorrow , who verbally acknowledged these results. Patient is reportedly ventilating with normal oxygen saturation and equal breath sounds. Electronically Signed   By: Jeb Levering M.D.   On: 02/04/2016 21:44   Dg Abd Portable 1v  02/05/2016  CLINICAL DATA:  Orogastric tube placement. EXAM: PORTABLE ABDOMEN - 1 VIEW COMPARISON:  None. FINDINGS: A size normal. Lung bases are clear. Side port of the NG  tube is in the fundus stomach. The stomach is decompressed. The bowel gas pattern is normal. Contrast is noted within the renal collecting systems bilaterally. IMPRESSION: Satisfactory positioning of the orogastric tube. Electronically Signed   By: San Morelle M.D.   On: 02/05/2016 08:16    Microbiology: Recent Results (from the past 240 hour(s))  MRSA PCR Screening     Status: None   Collection Time: 02/05/16  2:51 AM  Result Value Ref Range Status   MRSA by PCR NEGATIVE NEGATIVE Final    Comment:        The GeneXpert MRSA Assay (FDA approved for NASAL specimens only), is one component of a comprehensive MRSA colonization surveillance program. It is not intended to diagnose MRSA infection  nor to guide or monitor treatment for MRSA infections.      Labs: Basic Metabolic Panel:  Recent Labs Lab 02/04/16 2000 02/05/16 0226 02/06/16 0317  NA 142 141 134*  K 3.7 4.5 3.7  CL 103 106 104  CO2 26 20* 20*  GLUCOSE 165* 73 107*  BUN 11 14 7   CREATININE 0.93 0.89  0.94 0.75  CALCIUM 10.0 9.4 8.7*  MG  --  2.0  --   PHOS  --  5.0*  --    Liver Function Tests:  Recent Labs Lab 02/04/16 2000  AST 34  ALT 17  ALKPHOS 58  BILITOT 1.6*  PROT 7.8  ALBUMIN 4.2    Recent Labs Lab 02/04/16 2000  LIPASE 25   No results for input(s): AMMONIA in the last 168 hours. CBC:  Recent Labs Lab 02/04/16 2000 02/05/16 0226 02/06/16 0317  WBC 9.6 10.3 8.1  NEUTROABS 7.6  --   --   HGB 12.1 11.8* 10.2*  HCT 35.3* 34.7* 30.0*  MCV 101.1* 100.9* 101.7*  PLT 280 207 200   Cardiac Enzymes: No results for input(s): CKTOTAL, CKMB, CKMBINDEX, TROPONINI in the last 168 hours. BNP: BNP (last 3 results) No results for input(s): BNP in the last 8760 hours.  ProBNP (last 3 results) No results for input(s): PROBNP in the last 8760 hours.  CBG:  Recent Labs Lab 02/04/16 2113  GLUCAP 111*       Signed:  Louellen Molder MD.  [facility] 02/07/2016, 3:43  PM

## 2016-02-07 NOTE — Care Management Note (Signed)
Case Management Note  Patient Details  Name: Lorraine Tapia MRN: [id number] Date of Birth: May 06, 1951  Subjective/Objective:                    Action/Plan: Patient discharging to home with [facility] services. CM met with the patient and provided her a list of [facility] agencies in the [place] area. She selected [facility]. Manuela Schwartz with [facility] notified and accepted the referral. Patient also states she does not have transportation home. CM spoke with CSW and gave the bedside RN a taxi voucher for the patient. Patient informed. Bedside RN updated.   Expected Discharge Date:                  Expected Discharge Plan:     In-House Referral:     Discharge planning Services     Post Acute Care Choice:    Choice offered to:     DME Arranged:    DME Agency:     HH Arranged:    HH Agency:     Status of Service:  In process, will continue to follow  Medicare Important Message Given:    Date Medicare IM Given:    Medicare IM give by:    Date Additional Medicare IM Given:    Additional Medicare Important Message give by:     If discussed at [place] of Stay Meetings, dates discussed:    Additional Comments:  Pollie Friar, RN 02/07/2016, 4:27 PM

## 2016-02-07 NOTE — Progress Notes (Signed)
Pt discharged from hospital per orders from MD. Pt educated on discharge instructions. Pt verbalized understanding of instructions. IV was removed by pt prior to discharge. All questions and concerns were addressed. Pt provided taxi voucher for discharge. Pt exited hospital via wheelchair.

## 2016-03-18 ENCOUNTER — Ambulatory Visit: Payer: Self-pay | Admitting: Family Medicine

## 2016-03-29 ENCOUNTER — Ambulatory Visit: Payer: Self-pay | Admitting: Family Medicine

## 2016-06-25 ENCOUNTER — Encounter: Payer: Self-pay | Admitting: Family Medicine

## 2016-06-25 ENCOUNTER — Ambulatory Visit: Payer: Medicaid Other | Attending: Family Medicine | Admitting: Family Medicine

## 2016-06-25 VITALS — BP 93/60 | HR 84 | Temp 98.3°F | Resp 17 | Ht 70.0 in | Wt 143.4 lb

## 2016-06-25 DIAGNOSIS — F17210 Nicotine dependence, cigarettes, uncomplicated: Secondary | ICD-10-CM | POA: Diagnosis not present

## 2016-06-25 DIAGNOSIS — I10 Essential (primary) hypertension: Secondary | ICD-10-CM | POA: Diagnosis not present

## 2016-06-25 DIAGNOSIS — F319 Bipolar disorder, unspecified: Secondary | ICD-10-CM | POA: Diagnosis not present

## 2016-06-25 DIAGNOSIS — Z1231 Encounter for screening mammogram for malignant neoplasm of breast: Secondary | ICD-10-CM | POA: Diagnosis not present

## 2016-06-25 DIAGNOSIS — F1010 Alcohol abuse, uncomplicated: Secondary | ICD-10-CM

## 2016-06-25 DIAGNOSIS — K000 Anodontia: Secondary | ICD-10-CM

## 2016-06-25 DIAGNOSIS — K08109 Complete loss of teeth, unspecified cause, unspecified class: Secondary | ICD-10-CM

## 2016-06-25 DIAGNOSIS — Z79899 Other long term (current) drug therapy: Secondary | ICD-10-CM | POA: Diagnosis not present

## 2016-06-25 DIAGNOSIS — R634 Abnormal weight loss: Secondary | ICD-10-CM | POA: Diagnosis not present

## 2016-06-25 DIAGNOSIS — Z1159 Encounter for screening for other viral diseases: Secondary | ICD-10-CM

## 2016-06-25 DIAGNOSIS — E2839 Other primary ovarian failure: Secondary | ICD-10-CM

## 2016-06-25 LAB — HEMOCCULT GUIAC POC 1CARD (OFFICE): FECAL OCCULT BLD: NEGATIVE

## 2016-06-25 LAB — COMPLETE METABOLIC PANEL WITH GFR
ALBUMIN: 3.8 g/dL (ref 3.6–5.1)
ALK PHOS: 55 U/L (ref 33–130)
ALT: 11 U/L (ref 6–29)
AST: 20 U/L (ref 10–35)
BUN: 11 mg/dL (ref 7–25)
CHLORIDE: 99 mmol/L (ref 98–110)
CO2: 25 mmol/L (ref 20–31)
Calcium: 9.3 mg/dL (ref 8.6–10.4)
Creat: 0.61 mg/dL (ref 0.50–0.99)
GFR, Est African American: >89 mL/min (ref >=60)
Glucose, Bld: 88 mg/dL (ref 65–99)
POTASSIUM: 4.6 mmol/L (ref 3.5–5.3)
SODIUM: 133 mmol/L — AB (ref 135–146)
Total Bilirubin: 1.1 mg/dL (ref 0.2–1.2)
Total Protein: 7 g/dL (ref 6.1–8.1)

## 2016-06-25 LAB — CBC
HCT: 33.4 % — ABNORMAL LOW (ref 35.0–45.0)
HEMOGLOBIN: 11.5 g/dL — AB (ref 11.7–15.5)
MCH: 35.5 pg — AB (ref 27.0–33.0)
MCHC: 34.4 g/dL (ref 32.0–36.0)
MCV: 103.1 fL — ABNORMAL HIGH (ref 80.0–100.0)
MPV: 10.2 fL (ref 7.5–12.5)
Platelets: 207 10*3/uL (ref 140–400)
RBC: 3.24 MIL/uL — ABNORMAL LOW (ref 3.80–5.10)
RDW: 14.2 % (ref 11.0–15.0)
WBC: 3.6 10*3/uL — ABNORMAL LOW (ref 3.8–10.8)

## 2016-06-25 MED ORDER — VITAMIN B-1 100 MG PO TABS: 100.0000 mg | ORAL_TABLET | Freq: Every day | ORAL | 5 refills | Status: AC

## 2016-06-25 MED ORDER — THERA VITAL M PO TABS: 1.0000 | ORAL_TABLET | Freq: Every day | ORAL | 5 refills | Status: AC

## 2016-06-25 MED ORDER — FOLIC ACID 1 MG PO TABS: 1.0000 mg | ORAL_TABLET | Freq: Every day | ORAL | 5 refills | Status: AC

## 2016-06-25 NOTE — Progress Notes (Signed)
Subjective:  Patient ID: Lorraine Tapia, female    DOB: [birth year]  Age: 65 y.o. MRN: [id number]  CC: Weight Loss   HPI ZORYANA MONROE has Hx of R buccal adenocarcinoma, HTN, smoker, ETOH abuse, bipolar disorder with psychotic features she presents for  She stays in a room in a boarding house She presents today with a social worker from [facility] who also manages her finances.   1. Weight loss: no oral pain, no change in diet, no trouble swallowing. Drinking alcohol, beer, every day. 16 oz can per day. 1 can. When she gets paid she drinks 40 oz. Smoking 11-12 cigs per day. No streets drugs. No CP, SOB, GI upset (NV/D/constipation). No blood.  No skin changes. No swelling. Eating 1 meal a day. May eat bacon and eggs, fried chicken. She is cooking. She has enough money for food. She also gets food stamps. She gets $55/week to spend as she sees fit.   She has monthly visits from mental health for haldol injections.   She is overdue for multiple healthcare maintenance elements   Social History  Substance Use Topics  . Smoking status: Current Every Day Smoker    Packs/day: 0.50    Years: 49.00    Types: Cigarettes  . Smokeless tobacco: Never Used  . Alcohol use 1.2 oz/week    2 Cans of beer per week     Comment:  2 24  oz beer occasionally   Outpatient Medications Prior to Visit  Medication Sig Dispense Refill  . haloperidol decanoate (HALDOL DECANOATE) 50 MG/ML injection Inject 200 mg into the muscle every 30 (thirty) days. Last dose 01/26/14    . atorvastatin (LIPITOR) 20 MG tablet Take 1 tablet (20 mg total) by mouth daily. (Patient not taking: Reported on 06/25/2016) 90 tablet 3  . chlorthalidone (HYGROTON) 25 MG tablet Take 1 tablet (25 mg total) by mouth daily. (Patient not taking: Reported on 06/25/2016) 30 tablet 6  . ferrous sulfate 325 (65 FE) MG tablet Take 1 tablet (325 mg total) by mouth daily with breakfast. (Patient not taking: Reported on 06/25/2016) 60 tablet 3    . levETIRAcetam (KEPPRA) 500 MG tablet Take 1 tablet (500 mg total) by mouth 2 (two) times daily. (Patient not taking: Reported on 06/25/2016) 60 tablet 0  . Multiple Vitamin (MULTIVITAMIN WITH MINERALS) TABS tablet Take 1 tablet by mouth daily. (Patient not taking: Reported on 06/25/2016) 30 tablet 0  . Vitamin D, Ergocalciferol, (DRISDOL) 50000 UNITS CAPS capsule Take 1 capsule (50,000 Units total) by mouth every 7 (seven) days. For 12 weeks (Patient not taking: Reported on 06/25/2016) 12 capsule 0   No facility-administered medications prior to visit.     ROS Review of Systems  Constitutional: Negative for activity change, appetite change, chills, fatigue, fever and unexpected weight change.  Eyes: Negative for visual disturbance.  Respiratory: Negative for shortness of breath.   Cardiovascular: Negative for chest pain.  Gastrointestinal: Negative for abdominal distention, abdominal pain, anal bleeding, blood in stool, constipation, diarrhea, nausea, rectal pain and vomiting.  Musculoskeletal: Negative for arthralgias and back pain.  Skin: Negative for rash.  Allergic/Immunologic: Negative for immunocompromised state.  Hematological: Negative for adenopathy. Does not bruise/bleed easily.  Psychiatric/Behavioral: Negative for dysphoric mood and suicidal ideas.    Objective:  BP 93/60 (BP Location: Right Arm, Cuff Size: Small)   Pulse 84   Temp 98.3 F (36.8 C) (Oral)   Resp 17   Ht 5\' 10"  (1.778 m)  Wt 143 lb 6.4 oz (65 kg)   SpO2 100%   BMI 20.58 kg/m   BP/Weight 06/25/2016 02/07/2016 0000000  Systolic BP 93 [id number]  Diastolic BP 60 85 76  Wt. (Lbs) 143.4 167.5 156  BMI 20.58 24.72 21.77    Physical Exam  Constitutional: She is oriented to person, place, and time. She appears well-developed. No distress.  Unkempt appearance with uncombed hair and unwashed body    HENT:  Head: Normocephalic and atraumatic.  Edentulous Hypertrophied  R buccal mucosa   Cardiovascular:  Normal rate, regular rhythm, normal heart sounds and intact distal pulses.   Pulmonary/Chest: Effort normal and breath sounds normal.  Genitourinary: Rectum normal. Rectal exam shows guaiac negative stool.  Musculoskeletal: She exhibits no edema.  Neurological: She is alert and oriented to person, place, and time.  Skin: Skin is warm and dry. No rash noted.  Psychiatric: She has a normal mood and affect.     Assessment & Plan:  Areen was seen today for weight loss.  Diagnoses and all orders for this visit:  Unintentional weight loss -     POCT occult blood stool -     CBC -     COMPLETE METABOLIC PANEL WITH GFR -     DG Chest 2 View; Future -     Ambulatory referral to Gastroenterology -     Amb ref to Medical Nutrition Therapy-MNT  ETOH abuse -     thiamine (VITAMIN B-1) 100 MG tablet; Take 1 tablet (100 mg total) by mouth daily. -     folic acid (FOLVITE) 1 MG tablet; Take 1 tablet (1 mg total) by mouth daily. -     Multiple Vitamins-Minerals (MULTIVITAMIN) tablet; Take 1 tablet by mouth daily. -     Amb ref to Medical Nutrition Therapy-MNT  Need for hepatitis C screening test -     Hepatitis C antibody, reflex  Visit for screening mammogram -     MM DIGITAL SCREENING BILATERAL; Future  Edentulism -     Ambulatory referral to Dentistry  Estrogen deficiency -     DG Bone Density; Future   There are no diagnoses linked to this encounter.  No orders of the defined types were placed in this encounter.   Follow-up: Return in about 2 months (around 08/26/2016) for weight check and pap smear .   Boykin Nearing MD

## 2016-06-25 NOTE — Assessment & Plan Note (Signed)
Unintentional weightloss in female smoker with heme negative stools. Hx of adenocarcinoma in R buccal mucosa, current alcohol abuse, mental illness. Suspect primary cause of alcohol abuse and poor nutrition.   Plan: CXR GI referral for screening c-scope CMP,CBC,  Referral to nutrition Counseled patient to decrease etoh intake and increase intake of food Dental referral for dentures

## 2016-06-25 NOTE — Patient Instructions (Signed)
Lorraine Tapia was seen today for weight loss.  Diagnoses and all orders for this visit:  Unintentional weight loss -     POCT occult blood stool -     CBC -     COMPLETE METABOLIC PANEL WITH GFR -     DG Chest 2 View; Future -     Ambulatory referral to Gastroenterology  ETOH abuse -     thiamine (VITAMIN B-1) 100 MG tablet; Take 1 tablet (100 mg total) by mouth daily. -     folic acid (FOLVITE) 1 MG tablet; Take 1 tablet (1 mg total) by mouth daily. -     Multiple Vitamins-Minerals (MULTIVITAMIN) tablet; Take 1 tablet by mouth daily.  Need for hepatitis C screening test -     Hepatitis C antibody, reflex  Visit for screening mammogram -     MM DIGITAL SCREENING BILATERAL; Future  Edentulism -     Ambulatory referral to Dentistry  Estrogen deficiency -     DG Bone Density; Future    For weight loss I suspect main cause of overuse of alcohol and poor nutrition  Nutrition consult placed Decrease alcohol to 12 oz of beer per day   Labs ordered Studies ordered- chest x-ray, mammogram, bone density scan Referral- nutrition, GI for screening colonoscopy, dentist for dentures  F/u in 2 months for weight check and pap smear  Dr. Adrian Blackwater

## 2016-06-26 LAB — HEPATITIS C ANTIBODY: HCV Ab: NEGATIVE

## 2016-07-01 ENCOUNTER — Telehealth: Payer: Self-pay

## 2016-07-01 NOTE — Telephone Encounter (Signed)
Left message on patient's voicemail.

## 2016-07-03 NOTE — Telephone Encounter (Signed)
Patient did not answer. No voicemail. Patients results will be mailed out to her on 8/2

## 2016-07-07 ENCOUNTER — Encounter (HOSPITAL_COMMUNITY): Payer: Self-pay | Admitting: *Deleted

## 2016-07-07 ENCOUNTER — Emergency Department (HOSPITAL_COMMUNITY)
Admission: EM | Admit: 2016-07-07 | Discharge: 2016-07-08 | Disposition: A | Discharge status: Home / Self Care | Payer: Medicare Other | Attending: Emergency Medicine | Admitting: Emergency Medicine

## 2016-07-07 DIAGNOSIS — R569 Unspecified convulsions: Secondary | ICD-10-CM | POA: Insufficient documentation

## 2016-07-07 DIAGNOSIS — Z85818 Personal history of malignant neoplasm of other sites of lip, oral cavity, and pharynx: Secondary | ICD-10-CM | POA: Diagnosis not present

## 2016-07-07 LAB — COMPREHENSIVE METABOLIC PANEL
ALT: 24 U/L (ref 14–54)
ANION GAP: 8 (ref 5–15)
AST: 64 U/L — AB (ref 15–41)
Albumin: 3.8 g/dL (ref 3.5–5.0)
Alkaline Phosphatase: 47 U/L (ref 38–126)
BILIRUBIN TOTAL: 1.7 mg/dL — AB (ref 0.3–1.2)
BUN: 12 mg/dL (ref 6–20)
CALCIUM: 9.2 mg/dL (ref 8.9–10.3)
CHLORIDE: 101 mmol/L (ref 101–111)
CO2: 24 mmol/L (ref 22–32)
CREATININE: 0.88 mg/dL (ref 0.44–1.00)
GLUCOSE: 79 mg/dL (ref 65–99)
POTASSIUM: 3.3 mmol/L — AB (ref 3.5–5.1)
Sodium: 133 mmol/L — ABNORMAL LOW (ref 135–145)
TOTAL PROTEIN: 7.1 g/dL (ref 6.5–8.1)

## 2016-07-07 LAB — CBC WITH DIFFERENTIAL/PLATELET
BASOS PCT: 0 %
Basophils Absolute: 0 10*3/uL (ref 0.0–0.1)
EOS ABS: 0 10*3/uL (ref 0.0–0.7)
EOS PCT: 0 %
HEMATOCRIT: 28.6 % — AB (ref 36.0–46.0)
Hemoglobin: 10 g/dL — ABNORMAL LOW (ref 12.0–15.0)
Lymphocytes Relative: 7 %
Lymphs Abs: 0.6 10*3/uL — ABNORMAL LOW (ref 0.7–4.0)
MCH: 35.2 pg — ABNORMAL HIGH (ref 26.0–34.0)
MCHC: 35 g/dL (ref 30.0–36.0)
MCV: 100.7 fL — ABNORMAL HIGH (ref 78.0–100.0)
MONO ABS: 0.4 10*3/uL (ref 0.1–1.0)
MONOS PCT: 5 %
NEUTROS ABS: 7.5 10*3/uL (ref 1.7–7.7)
Neutrophils Relative %: 88 %
PLATELETS: 153 10*3/uL (ref 150–400)
RBC: 2.84 MIL/uL — ABNORMAL LOW (ref 3.87–5.11)
RDW: 13.4 % (ref 11.5–15.5)
WBC: 8.5 10*3/uL (ref 4.0–10.5)

## 2016-07-07 LAB — ETHANOL: Alcohol, Ethyl (B): <5 mg/dL (ref <5)

## 2016-07-07 MED ORDER — POTASSIUM CHLORIDE CRYS ER 20 MEQ PO TBCR
40.0000 meq | EXTENDED_RELEASE_TABLET | Freq: Once | ORAL | Status: AC
  Administered 2016-07-07: 40 meq via ORAL
  Filled 2016-07-07: qty 2

## 2016-07-07 MED ORDER — POTASSIUM CHLORIDE CRYS ER 20 MEQ PO TBCR: 20.0000 meq | EXTENDED_RELEASE_TABLET | Freq: Once | ORAL | Status: DC

## 2016-07-07 NOTE — ED Notes (Signed)
Pt will not answer any questions, only curses and uses inappropriate words towards staff.

## 2016-07-07 NOTE — ED Provider Notes (Signed)
[facility] DEPT Provider Note   CSN: [id number] Arrival date & time: 07/07/16  1742  First Provider Contact:  None       History   Chief Complaint Chief Complaint  Patient presents with  . Seizures    HPI Lorraine Tapia is a 65 y.o. female.  HPI   Pt is a 65 y.o. Female brought in via EMS after a bystander saw her having seizure like activity. Pt was combative and unwilling to answer my questions. When asked her name she stated "hell no". She made numerous racial comments to me. She did respond "no" when I asked her if she was in any pain and denied abdominal pain, headache, chest pain or SOB. She would not answer any additional questions and was using obscene language.  Past Medical History:  Diagnosis Date  . Bipolar disorder ([place])   . 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 Depression   . Hx of radiation therapy 02/21/14-04/06/14   right buccal 66y/34fx  . Psychiatric disorder   . Shortness of breath    with exertion    There are no active problems to display for this patient.   Past Surgical History:  Procedure Laterality Date  . MULTIPLE EXTRACTIONS WITH ALVEOLOPLASTY N/A 02/01/2014   Procedure: Extraction of tooth #'s 6,7,8,10,11,12,15,16,22,23,25,26,27 with alveoloplasty.;  Surgeon: Lenn Cal, DDS;  Location: [facility] ORS;  Service: Oral Surgery;  Laterality: N/A;  ANESTHESIA: GENERAL WITH NASAL TUBE  . Right buccal mucosa composite resection  10/19/2013   S/P right buccal mucosa composite resection with flap reconstruction and right modified neck dissection on 10/20/2039 by Dr. Fredricka Bonine at [facility] History    No data available       Home Medications    Prior to Admission medications   Medication Sig Start Date End Date Taking? Authorizing Provider  folic acid (FOLVITE) 1 MG tablet Take 1 tablet (1 mg total) by mouth daily. 06/25/16   Josalyn Funches, MD  haloperidol decanoate (HALDOL DECANOATE) 50 MG/ML injection Inject  200 mg into the muscle every 30 (thirty) days. Last dose 01/26/14    Historical Provider, MD  Multiple Vitamins-Minerals (MULTIVITAMIN) tablet Take 1 tablet by mouth daily. 06/25/16   Josalyn Funches, MD  thiamine (VITAMIN B-1) 100 MG tablet Take 1 tablet (100 mg total) by mouth daily. 06/25/16   Boykin Nearing, MD    Family History No family history on file.  Social History Social History  Substance Use Topics  . Smoking status: Not on file  . Smokeless tobacco: Not on file  . Alcohol use Not on file     Allergies   Other   Review of Systems Review of Systems  Constitutional: Negative for fever.  Respiratory: Negative for shortness of breath.   Cardiovascular: Negative for chest pain.  Gastrointestinal: Negative for abdominal pain.  Musculoskeletal: Negative for back pain.     Physical Exam Updated Vital Signs BP 139/79   Pulse 76   Temp 99.3 F (37.4 C) (Oral)   Resp 19   Ht 5\' 10"  (1.778 m)   Wt 56.7 kg   SpO2 100%   BMI 17.94 kg/m   Physical Exam  Constitutional: She appears well-developed and well-nourished. No distress.  HENT:  Head: Normocephalic and atraumatic.  Swelling of lower lip, no bleeding noted  Eyes: Conjunctivae are normal.  Cardiovascular: Normal rate and normal heart sounds.  Exam reveals no gallop and no friction rub.   No murmur  heard. Pulses:      Radial pulses are 2+ on the right side, and 2+ on the left side.       Posterior tibial pulses are 2+ on the right side, and 2+ on the left side.  Pulmonary/Chest: Effort normal. No respiratory distress.  Abdominal: Soft. Normal appearance. She exhibits no distension. There is no tenderness. There is no rigidity and no guarding.  Musculoskeletal: Normal range of motion.  Neurological: She is alert. Coordination normal.  Skin: Skin is warm and dry. She is not diaphoretic.  Psychiatric: Her affect is angry and inappropriate. She is combative.  Nursing note and vitals reviewed.    ED  Treatments / Results  Labs (all labs ordered are listed, but only abnormal results are displayed) Labs Reviewed  COMPREHENSIVE METABOLIC PANEL - Abnormal; Notable for the following:       Result Value   Sodium 133 (*)    Potassium 3.3 (*)    AST 64 (*)    Total Bilirubin 1.7 (*)    All other components within normal limits  CBC WITH DIFFERENTIAL/PLATELET - Abnormal; Notable for the following:    RBC 2.84 (*)    Hemoglobin 10.0 (*)    HCT 28.6 (*)    MCV 100.7 (*)    MCH 35.2 (*)    Lymphs Abs 0.6 (*)    All other components within normal limits  ETHANOL    EKG  EKG Interpretation  Date/Time:  Sunday July 07 2016 17:55:01 EDT Ventricular Rate:  90 PR Interval:    QRS Duration: 72 QT Interval:  363 QTC Calculation: 445 R Axis:   64 Text Interpretation:  Sinus rhythm Atrial premature complex Confirmed by RAY MD, Andee Poles [id number]) on 07/08/2016 4:55:25 PM       Radiology No results found.  Procedures Procedures (including critical care time)  Medications Ordered in ED Medications - No data to display   Initial Impression / Assessment and Plan / ED Course  I have reviewed the triage vital signs and the nursing notes.  Pertinent labs & imaging results that were available during my care of the patient were reviewed by me and considered in my medical decision making (see chart for details).  Clinical Course    Pt brought in via EMS after a bystander saw pt having "seizure like" activity on the side of the road. Pt was uncooperative and combative. Pt would not answers my questions but she did tell me she felt fine and was not in any pain. Her PE was unremarkable except for a mildly swollen lower lip, pt denied headache or trauma, pt not anticoagulated and not complaining of headache and appears A&O so no indication for imaging at this time.   Labs revealed mild hypokalemia which was treated in the ED with PO K. Mildly elevated AST and total bilirubin, likely 2/2 ETOH  with pts hx of alcohol abuse although wouldn't tell me if she drank ETOH. ETOH in the ED was negative. Pt with mild anemia likely 2/2 alcohol use. Review of EMR, pt was seen by her PCP on 06/25/2016 and ran a CBC, CMP, occult blood stool and chest xray. She was given thiamine, folic acid, and multivitamins by her PCP.   The pt had no complaints and no signs of seizure activity while in the ED. Pt was evaluated for seizures inpatient on 02/07/2016. Pt with bipolar disorder.  I discussed with case with Dr. Stark Jock and he did not feel there was an indication to  treat her anemia at this time as pt did not have pain, weakness, or unstable vital signs and she is being followed by her PCP. Her hemoglobin is also close to her baseline and her stools were heme negative on 7/25. EKG SR with APC's.  Pt did not want anything to eat while in the ED. We watched the pt to assess for seizure activity and none was observed. Pt was more cooperative, alert, and calm prior to discharge. Pt afebrile, VSS, appears well, will be discharged home. Instructed pt to f/u with [facility] and wellness or PCP in 2 days and return to the ED with any concerning symptoms. Pt expressed understanding to the discharge instructions.   Pt case discussed with Dr. Stark Jock who agrees with the above plan.  Final Clinical Impressions(s) / ED Diagnoses   Final diagnoses:  Seizure-like activity [facility])    New Prescriptions New Prescriptions   No medications on file     Kalman Drape, [place] 07/09/16 1622    Veryl Speak, MD 07/10/16 [phone]
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
## 2016-07-07 NOTE — ED Notes (Signed)
Seizure pads placed

## 2016-07-07 NOTE — Discharge Instructions (Signed)
Follow up at the [facility] and Wellness center to be reevaluated.   Return to the emergency department if you experience chest pain, short of breath, abdominal pain, nausea, vomiting, dizziness, fevers or any other concerning symptoms.

## 2016-07-07 NOTE — ED Triage Notes (Signed)
Pt arrives via EMS. Per report from EMS, the pt was lying on the side of the road [employer]) and bystander saw her having seizure like activity. On arrival to scene, pt was alert, non verbal and incontinent of urine. Pt unable to given name/medical history, only stated she was 65 years old.

## 2016-07-08 ENCOUNTER — Ambulatory Visit: Payer: Self-pay

## 2016-07-08 ENCOUNTER — Other Ambulatory Visit: Payer: Self-pay

## 2016-07-29 ENCOUNTER — Ambulatory Visit: Payer: Self-pay

## 2016-07-29 ENCOUNTER — Other Ambulatory Visit: Payer: Self-pay

## 2016-09-17 ENCOUNTER — Encounter (HOSPITAL_COMMUNITY): Payer: Self-pay | Admitting: Emergency Medicine

## 2016-09-17 ENCOUNTER — Emergency Department (HOSPITAL_COMMUNITY)
Admission: EM | Admit: 2016-09-17 | Discharge: 2016-09-18 | Disposition: A | Discharge status: Home / Self Care | Payer: Medicare Other | Attending: Emergency Medicine | Admitting: Emergency Medicine

## 2016-09-17 DIAGNOSIS — Z5181 Encounter for therapeutic drug level monitoring: Secondary | ICD-10-CM | POA: Diagnosis not present

## 2016-09-17 DIAGNOSIS — F2089 Other schizophrenia: Secondary | ICD-10-CM | POA: Diagnosis not present

## 2016-09-17 DIAGNOSIS — I10 Essential (primary) hypertension: Secondary | ICD-10-CM | POA: Diagnosis not present

## 2016-09-17 DIAGNOSIS — Z85818 Personal history of malignant neoplasm of other sites of lip, oral cavity, and pharynx: Secondary | ICD-10-CM | POA: Insufficient documentation

## 2016-09-17 DIAGNOSIS — R402421 Glasgow coma scale score 9-12, in the field [EMT or ambulance]: Secondary | ICD-10-CM | POA: Diagnosis not present

## 2016-09-17 DIAGNOSIS — F209 Schizophrenia, unspecified: Secondary | ICD-10-CM | POA: Diagnosis present

## 2016-09-17 DIAGNOSIS — Z046 Encounter for general psychiatric examination, requested by authority: Secondary | ICD-10-CM | POA: Diagnosis present

## 2016-09-17 LAB — CBC WITH DIFFERENTIAL/PLATELET
Basophils Absolute: 0 10*3/uL (ref 0.0–0.1)
Basophils Relative: 0 %
EOS ABS: 0 10*3/uL (ref 0.0–0.7)
Eosinophils Relative: 0 %
HEMATOCRIT: 35 % — AB (ref 36.0–46.0)
HEMOGLOBIN: 12.1 g/dL (ref 12.0–15.0)
LYMPHS ABS: 0.6 10*3/uL — AB (ref 0.7–4.0)
LYMPHS PCT: 11 %
MCH: 35.6 pg — AB (ref 26.0–34.0)
MCHC: 34.6 g/dL (ref 30.0–36.0)
MCV: 102.9 fL — AB (ref 78.0–100.0)
MONOS PCT: 7 %
Monocytes Absolute: 0.4 10*3/uL (ref 0.1–1.0)
NEUTROS PCT: 82 %
Neutro Abs: 4.1 10*3/uL (ref 1.7–7.7)
Platelets: 218 10*3/uL (ref 150–400)
RBC: 3.4 MIL/uL — ABNORMAL LOW (ref 3.87–5.11)
RDW: 16.4 % — ABNORMAL HIGH (ref 11.5–15.5)
WBC: 5.1 10*3/uL (ref 4.0–10.5)

## 2016-09-17 LAB — COMPREHENSIVE METABOLIC PANEL
ALK PHOS: 54 U/L (ref 38–126)
ALT: 17 U/L (ref 14–54)
ANION GAP: 8 (ref 5–15)
AST: 25 U/L (ref 15–41)
Albumin: 4.3 g/dL (ref 3.5–5.0)
BILIRUBIN TOTAL: 1.5 mg/dL — AB (ref 0.3–1.2)
BUN: 11 mg/dL (ref 6–20)
CALCIUM: 9.8 mg/dL (ref 8.9–10.3)
CO2: 28 mmol/L (ref 22–32)
CREATININE: 0.75 mg/dL (ref 0.44–1.00)
Chloride: 101 mmol/L (ref 101–111)
Glucose, Bld: 113 mg/dL — ABNORMAL HIGH (ref 65–99)
Potassium: 3 mmol/L — ABNORMAL LOW (ref 3.5–5.1)
SODIUM: 137 mmol/L (ref 135–145)
TOTAL PROTEIN: 8.2 g/dL — AB (ref 6.5–8.1)

## 2016-09-17 LAB — URINALYSIS, ROUTINE W REFLEX MICROSCOPIC
BILIRUBIN URINE: NEGATIVE
Glucose, UA: NEGATIVE mg/dL
Hgb urine dipstick: NEGATIVE
KETONES UR: NEGATIVE mg/dL
LEUKOCYTES UA: NEGATIVE
NITRITE: NEGATIVE
PROTEIN: NEGATIVE mg/dL
Specific Gravity, Urine: 1.008 (ref 1.005–1.030)
pH: 6.5 (ref 5.0–8.0)

## 2016-09-17 LAB — ETHANOL: Alcohol, Ethyl (B): <5 mg/dL (ref <5)

## 2016-09-17 LAB — ACETAMINOPHEN LEVEL

## 2016-09-17 LAB — SALICYLATE LEVEL

## 2016-09-17 LAB — RAPID URINE DRUG SCREEN, HOSP PERFORMED
AMPHETAMINES: NOT DETECTED
BARBITURATES: NOT DETECTED
BENZODIAZEPINES: POSITIVE — AB
Cocaine: NOT DETECTED
Opiates: NOT DETECTED
Tetrahydrocannabinol: NOT DETECTED

## 2016-09-17 MED ORDER — ZIPRASIDONE MESYLATE 20 MG IM SOLR: 20.0000 mg | Freq: Once | INTRAMUSCULAR | Status: DC | PRN

## 2016-09-17 MED ORDER — LORAZEPAM 2 MG/ML IJ SOLN
4.0000 mg | Freq: Once | INTRAMUSCULAR | Status: AC
  Administered 2016-09-17: 4 mg via INTRAMUSCULAR
  Filled 2016-09-17: qty 2

## 2016-09-17 MED ORDER — HALOPERIDOL LACTATE 5 MG/ML IJ SOLN
5.0000 mg | Freq: Once | INTRAMUSCULAR | Status: AC
  Administered 2016-09-17: 5 mg via INTRAMUSCULAR
  Filled 2016-09-17: qty 1

## 2016-09-17 MED ORDER — ZIPRASIDONE MESYLATE 20 MG IM SOLR
20.0000 mg | Freq: Once | INTRAMUSCULAR | Status: AC
  Administered 2016-09-17: 20 mg via INTRAMUSCULAR
  Filled 2016-09-17: qty 20

## 2016-09-17 NOTE — ED Notes (Signed)
PT would not allow this writer to take any vitals . RN have been made aware
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
## 2016-09-17 NOTE — ED Notes (Addendum)
Patient pulled out iv and is refusing to allow staff to clean her up and take her vitals or collet blood work.
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
## 2016-09-17 NOTE — ED Notes (Signed)
Pt unable to consult with TTS at this time due to sedation. Once pt becomes more aroused, provider will be notified of the need to reorder the consult.

## 2016-09-17 NOTE — ED Notes (Signed)
Bed: [id number] Expected date:  Expected time:  Means of arrival:  Comments: EMS- incoherent, psych hx

## 2016-09-17 NOTE — ED Notes (Signed)
Unable to collect labs patient refused

## 2016-09-17 NOTE — ED Notes (Signed)
PT allow me to do BP but refuse temp. Did not want staff to clean blood off bed from IV

## 2016-09-17 NOTE — BH Assessment (Signed)
1st TTS consult attempt. Pt sedated and unable to arouse. Clinician informed Loma Sousa, RN and consult to be resubmitted when pt is able to arouse and participate.

## 2016-09-17 NOTE — Progress Notes (Addendum)
Medicaid [place] access response hx indicates the assigned pcp is Fitzgerald Berger,  [id number] [phone]  Pt last seen in July by Dr Adrian Blackwater and staff at [facility] informed ED CM on 09/17/16 that the pt would have to call back on 09/23/16 to make an appt for the month of November 2017 when the new appts are available  Entered in d/c instructions

## 2016-09-18 DIAGNOSIS — F209 Schizophrenia, unspecified: Secondary | ICD-10-CM | POA: Diagnosis present

## 2016-09-18 NOTE — ED Notes (Signed)
Patient received Geodone last night and is still sleeping this morning. TTS is waiting to assess patient and will be contacted when she wakes up. Patient calm and sleeping no complaints at this time.

## 2016-09-18 NOTE — BH Assessment (Addendum)
Assessment Note  Lorraine Tapia is an 65 y.o. female. Per EMS "Pt from home, landlord called for bizarre behavior, and incoherence. Pt was obviously confused . Pt was given 5 mg Versed IM  due to aggressive behavior. Hx psychosis. Pt recently released from  jail x1 month. Writer met with patient face to face. Patient is not sure of why she was brought to the ED. Sts that the last thing she recalls is EMS coming to home. She admits to a history of Bipolar Disorder, Schizophrenia, and Depression. Patient denies SI. Denies history of SI and/or self mutilating behaviors. She admits to depressive symptoms including loss of interest in usual pleasures, fatigue, and hopelessness. Denies family history of mental health illness. No HI. Denies aggressive or assaultive behaviors. Denies current legal issues. Nursing notes indicated that she recently spent time in jail; patient denies. Denies AVH's. She admits to a history of INPT mental health hospitalization in [place] "yrs ago". No drug use. Patient sts that she occasionally drinks beers. Last beer was several weeks ago.   Patient was unable to recall the name and location of her outpatient provider. She sts that a nurse comes to her home to give her a Haldol shot 1x/month. Patient sts that her family may know the name of her outpatient provider. Patient provided verbal consent for this writer to contact family. Writer contacted patient's daughter Lorraine Tapia) [phone] and left a voicemail. Writer then contacted patient's granddaughter Lorraine Tapia) [phone]. The grand daughter sts, "I don't know who her provider is but my mom will know (referring to Lorraine Tapia)". The grand daughter explains that her mother is at work at this time and doesn't answer the phone when working. Writer advised the grandmother to speak to her mother about assisting patient with her follow up psychiatric treatment. The grand daughter agreed to find out patient's outpatient  psychiatrist by consulting with her mother once her mother is off work. The grand daughter expressed her understanding of the importance of following up and agreed to assist patient with a follow up appt.    Diagnosis: Bipolar Disorder, Unspecified   Past Medical History:  Past Medical History:  Diagnosis Date  . Bipolar disorder (HCC)   . 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 Depression   . Hx of radiation therapy 02/21/14-04/06/14   right buccal 66y/49fx  . Psychiatric disorder   . Shortness of breath    with exertion    Past Surgical History:  Procedure Laterality Date  . MULTIPLE EXTRACTIONS WITH ALVEOLOPLASTY N/A 02/01/2014   Procedure: Extraction of tooth #'s 6,7,8,10,11,12,15,16,22,23,25,26,27 with alveoloplasty.;  Surgeon: Charlynne Pander, DDS;  Location: [facility] ORS;  Service: Oral Surgery;  Laterality: N/A;  ANESTHESIA: GENERAL WITH NASAL TUBE  . Right buccal mucosa composite resection  10/19/2013   S/P right buccal mucosa composite resection with flap reconstruction and right modified neck dissection on 10/20/2039 by Dr. Wendall Mola at [facility]    Family History:  Family History  Problem Relation Age of Onset  . Cancer Mother   . Hypertension Mother   . Hypertension Father     Social History:  reports that she has been smoking Cigarettes.  She has a 24.50 pack-year smoking history. She has never used smokeless tobacco. She reports that she drinks about 1.2 oz of alcohol per week . She reports that she does not use drugs.  Additional Social History:      Social History:  reports that she has been smoking  Cigarettes.  She has a 24.50 pack-year smoking history. She has never used smokeless tobacco. She reports that she drinks about 1.2 oz of alcohol per week . She reports that she does not use drugs.  Additional Social History:     CIWA: CIWA-Ar BP: 113/74 Pulse Rate: 85 COWS:    Allergies:  Allergies  Allergen Reactions  . Other Itching     Rash and itching with pepper    Home Medications:  (Not in a hospital admission)  OB/GYN Status:  No LMP recorded. Patient is postmenopausal.  General Assessment Data Location of Assessment: [facility] TTS Assessment: In system Is this a Tele or Face-to-Face Assessment?: Face-to-Face Is this an Initial Assessment or a Re-assessment for this encounter?: Initial Assessment Marital status: Single Maiden name:  (n/a) Is patient pregnant?: No Pregnancy Status: No Living Arrangements: Other (Comment) (patient lives alone) Can pt return to current living arrangement?: Yes Admission Status: Voluntary Is patient capable of signing voluntary admission?: Yes Referral Source: Self/Family/Friend Insurance type:  [facility])     Crisis Care Plan Living Arrangements: Other (Comment) (patient lives alone) Legal Guardian: Other: (no legal guardian ) Name of Psychiatrist:  (patient has a provider but unable to recall name of provider) Name of Therapist:  (patient has a provider but unable to recall name of provider)  Education Status Is patient currently in school?: No Current Grade:  (n/a) 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 : No Suicidal Intent: No Has patient had any suicidal intent within the past 6 months prior to admission? : No Is patient at risk for suicide?: No Suicidal Plan?: No Has patient had any suicidal plan within the past 6 months prior to admission? : No Access to Means: No What has been your use of drugs/alcohol within the last 12 months?:  (patient denies; UDS + for Benzo's) Previous Attempts/Gestures: No How many times?:  (0) Other Self Harm Risks:  (no self harm) Triggers for Past Attempts: Other (Comment) (no previous triggers) Intentional Self Injurious Behavior:  None Family Suicide History: No Recent stressful life event(s): Other (Comment) ("I don't have any stressors) Persecutory voices/beliefs?: No Depression: Yes Depression Symptoms: Feeling angry/irritable, Feeling worthless/self pity, Loss of interest in usual pleasures, Fatigue, Isolating, Tearfulness Substance abuse history and/or treatment for substance abuse?: No Suicide prevention information given to non-admitted patients: Not applicable  Risk to Others within the past 6 months Homicidal Ideation: No Does patient have any lifetime risk of violence toward others beyond the six months prior to admission? : No Thoughts of Harm to Others: No Current Homicidal Intent: No Current Homicidal Plan: No Access to Homicidal Means: No Identified Victim:  (n/a) History of harm to others?: No Assessment of Violence: None Noted Violent Behavior Description:  (patient is calm and cooperative) Does patient have access to weapons?: No Criminal Charges Pending?: No Does patient have a court date: No Is patient on probation?: No  Psychosis Hallucinations: None noted Delusions: None noted  Mental Status Report Appearance/Hygiene: In scrubs Eye Contact: Good Motor Activity: Freedom of movement Speech: Logical/coherent Level of Consciousness: Alert Mood: Depressed Affect: Appropriate to circumstance Anxiety Level: None Thought Processes: Relevant, Coherent Judgement: Impaired Orientation: Person, Place, Time, Situation Obsessive Compulsive Thoughts/Behaviors: None  Cognitive Functioning Concentration: Decreased Memory: Recent Intact, Remote  Intact IQ: Average Insight: Poor Impulse Control: Good Appetite: Fair Weight Loss:  (none reported) Weight Gain:  (none reported) Sleep: Decreased Total Hours of Sleep:  (varies- 5 to 8 hrs of sleep per night ) Vegetative Symptoms: None  ADLScreening [facility] Assessment Services) Patient's cognitive ability adequate to safely complete daily  activities?: Yes Patient able to express need for assistance with ADLs?: Yes Independently performs ADLs?: Yes (appropriate for developmental age)  Prior Inpatient Therapy Prior Inpatient Therapy: Yes Prior Therapy Dates:  ("yrs ago") Prior Therapy Facilty/Provider(s):  [facility]) Reason for Treatment:  (psychosis and med non compliance)  Prior Outpatient Therapy Prior Outpatient Therapy: No Prior Therapy Dates:  (n/a) Prior Therapy Facilty/Provider(s):  (n/a) Reason for Treatment:  (n/a) Does patient have an ACCT team?: No Does patient have Intensive In-House Services?  : No Does patient have [facility] services? : No Does patient have P4CC services?: No  ADL Screening (condition at time of admission) Patient's cognitive ability adequate to safely complete daily activities?: Yes Patient able to express need for assistance with ADLs?: Yes Independently performs ADLs?: Yes (appropriate for developmental age)    Regulatory affairs officer (For Healthcare) Does patient have an advance directive?: No Would patient like information on creating an advanced directive?: No - patient declined information    Additional Information 1:1 In Past 12 Months?: No CIRT Risk: No Elopement Risk: No Does patient have medical clearance?: Yes     Disposition:  Disposition Initial Assessment Completed for this Encounter: Yes Disposition of Patient: Other dispositions (Discharge to follow up w/ current provider) Other disposition(s): Other (Comment) (discharge to follow up with current provider  Patient was unable to recall the name and location of her outpatient provider. She sts that a nurse comes to her home to give her a Haldol shot 1x/month. Patient sts that her family may know the name of her outpatient provider. Writer contacted patient's daughter Rosita Guzzetta) [phone] and left a voicemail. Writer then contacted patient's granddaughter Dajiah Kooi) [phone]. The grand daughter sts, "I don't  know who her provider is but my mom will know". She explains that her mother is at work at this time and doesn't answer the phone when working. Writer advised the grandmother to speak to her mother about assisting patient with her follow up psychiatric treatment. The grand daughter agreed to find out patient's outpatient psychiatrist by consulting with her mother. She also agreed to assist patient with a follow up appt.   On Site Evaluation by:   Reviewed with Physician:  Waylan Boga, DNP    On Site Evaluation by:   Reviewed with Physician:    Waldon Merl [facility] 09/18/2016 11:26 AM

## 2016-09-18 NOTE — Discharge Instructions (Signed)
Schizophrenia °Schizophrenia is a mental illness. It may cause disturbed or disorganized thinking, speech, or behavior. People with schizophrenia have problems functioning in one or more areas of life: work, school, home, or relationships. People with schizophrenia are at increased risk for suicide, certain chronic physical illnesses, and unhealthy behaviors, such as smoking and drug use. °People who have family members with schizophrenia are at higher risk of developing the illness. Schizophrenia affects men and women equally but usually appears at an earlier age (teenage or early adult years) in men.  °SYMPTOMS °The earliest symptoms are often subtle (prodrome) and may go unnoticed until the illness becomes more severe (first-break psychosis). Symptoms of schizophrenia may be continuous or may come and go in severity. Episodes often are triggered by major life events, such as family stress, college, military service, marriage, pregnancy or child birth, divorce, or loss of a loved one. People with schizophrenia may see, hear, or feel things that do not exist (hallucinations). They may have false beliefs in spite of obvious proof to the contrary (delusions). Sometimes speech is incoherent or behavior is odd or withdrawn.  °DIAGNOSIS °Schizophrenia is diagnosed through an assessment by your caregiver. Your caregiver will ask questions about your thoughts, behavior, mood, and ability to function in daily life. Your caregiver may ask questions about your medical history and use of alcohol or drugs, including prescription medication. Your caregiver may also order blood tests and imaging exams. Certain medical conditions and substances can cause symptoms that resemble schizophrenia. Your caregiver may refer you to a mental health specialist for evaluation. There are three major criterion for a diagnosis of schizophrenia: °· Two or more of the following five symptoms are present for a month or longer: °¨ Delusions. Often  the delusions are that you are being attacked, harassed, cheated, persecuted or conspired against (persecutory delusions). °¨ Hallucinations.   °¨ Disorganized speech that does not make sense to others. °¨ Grossly disorganized (confused or unfocused) behavior or extremely overactive or underactive motor activity (catatonia). °¨ Negative symptoms such as bland or blunted emotions (flat affect), loss of will power (avolition), and withdrawal from social contacts (social isolation). °· Level of functioning in one or more major areas of life (work, school, relationships, or self-care) is markedly below the level of functioning before the onset of illness.   °· There are continuous signs of illness (either mild symptoms or decreased level of functioning) for at least 6 months or longer. °TREATMENT  °Schizophrenia is a long-term illness. It is best controlled with continuous treatment rather than treatment only when symptoms occur. The following treatments are used to manage schizophrenia: °· Medication--Medication is the most effective and important form of treatment for schizophrenia. Antipsychotic medications are usually prescribed to help manage schizophrenia. Other types of medication may be added to relieve any symptoms that may occur despite the use of antipsychotic medications. °· Counseling or talk therapy--Individual, group, or family counseling may be helpful in providing education, support, and guidance. Many people with schizophrenia also benefit from social skills and job skills (vocational) training. °A combination of medication and counseling is best for managing the disorder over time. A procedure in which electricity is applied to the brain through the scalp (electroconvulsive therapy) may be used to treat catatonic schizophrenia or schizophrenia in people who cannot take or do not respond to medication and counseling. °  °This information is not intended to replace advice given to you by your health  care provider. Make sure you discuss any questions you have with   your health care provider. °  °Document Released: 11/15/2000 Document Revised: 12/09/2014 Document Reviewed: 02/10/2013 °Elsevier Interactive Patient Education ©2016 [employer] ° °

## 2016-09-18 NOTE — ED Notes (Signed)
Was told patient was cleared to go home. Patient discharged home with family. Patient walked to car with no problems.

## 2016-09-26 ENCOUNTER — Emergency Department (HOSPITAL_COMMUNITY)
Admission: EM | Admit: 2016-09-26 | Discharge: 2016-09-27 | Disposition: A | Discharge status: Home / Self Care | Payer: Medicare Other | Attending: Emergency Medicine | Admitting: Emergency Medicine

## 2016-09-26 ENCOUNTER — Encounter (HOSPITAL_COMMUNITY): Payer: Self-pay | Admitting: Emergency Medicine

## 2016-09-26 ENCOUNTER — Emergency Department (HOSPITAL_COMMUNITY): Payer: Medicare Other

## 2016-09-26 DIAGNOSIS — Y92099 Unspecified place in other non-institutional residence as the place of occurrence of the external cause: Secondary | ICD-10-CM | POA: Diagnosis not present

## 2016-09-26 DIAGNOSIS — Y939 Activity, unspecified: Secondary | ICD-10-CM | POA: Insufficient documentation

## 2016-09-26 DIAGNOSIS — W06XXXA Fall from bed, initial encounter: Secondary | ICD-10-CM | POA: Insufficient documentation

## 2016-09-26 DIAGNOSIS — W19XXXA Unspecified fall, initial encounter: Secondary | ICD-10-CM

## 2016-09-26 DIAGNOSIS — R220 Localized swelling, mass and lump, head: Secondary | ICD-10-CM | POA: Diagnosis not present

## 2016-09-26 DIAGNOSIS — S299XXA Unspecified injury of thorax, initial encounter: Secondary | ICD-10-CM | POA: Diagnosis not present

## 2016-09-26 DIAGNOSIS — F10129 Alcohol abuse with intoxication, unspecified: Secondary | ICD-10-CM | POA: Insufficient documentation

## 2016-09-26 DIAGNOSIS — Y999 Unspecified external cause status: Secondary | ICD-10-CM | POA: Insufficient documentation

## 2016-09-26 DIAGNOSIS — S0992XA Unspecified injury of nose, initial encounter: Secondary | ICD-10-CM | POA: Diagnosis present

## 2016-09-26 DIAGNOSIS — S022XXA Fracture of nasal bones, initial encounter for closed fracture: Secondary | ICD-10-CM | POA: Insufficient documentation

## 2016-09-26 DIAGNOSIS — F10920 Alcohol use, unspecified with intoxication, uncomplicated: Secondary | ICD-10-CM

## 2016-09-26 DIAGNOSIS — S199XXA Unspecified injury of neck, initial encounter: Secondary | ICD-10-CM | POA: Diagnosis not present

## 2016-09-26 HISTORY — DX: Alcohol abuse, uncomplicated: F10.10

## 2016-09-26 LAB — URINALYSIS, ROUTINE W REFLEX MICROSCOPIC
BILIRUBIN URINE: NEGATIVE
GLUCOSE, UA: NEGATIVE mg/dL
Hgb urine dipstick: NEGATIVE
KETONES UR: NEGATIVE mg/dL
Leukocytes, UA: NEGATIVE
NITRITE: NEGATIVE
PH: 5.5 (ref 5.0–8.0)
PROTEIN: NEGATIVE mg/dL
Specific Gravity, Urine: 1.004 — ABNORMAL LOW (ref 1.005–1.030)

## 2016-09-26 LAB — I-STAT CHEM 8, ED
BUN: 5 mg/dL — AB (ref 6–20)
CALCIUM ION: 1.1 mmol/L — AB (ref 1.15–1.40)
CHLORIDE: 94 mmol/L — AB (ref 101–111)
Creatinine, Ser: 0.9 mg/dL (ref 0.44–1.00)
GLUCOSE: 90 mg/dL (ref 65–99)
HCT: 37 % (ref 36.0–46.0)
Hemoglobin: 12.6 g/dL (ref 12.0–15.0)
Potassium: 3 mmol/L — ABNORMAL LOW (ref 3.5–5.1)
Sodium: 135 mmol/L (ref 135–145)
TCO2: 29 mmol/L (ref 0–100)

## 2016-09-26 LAB — CBG MONITORING, ED: Glucose-Capillary: 90 mg/dL (ref 65–99)

## 2016-09-26 LAB — COMPREHENSIVE METABOLIC PANEL
ALK PHOS: 52 U/L (ref 38–126)
ALT: 14 U/L (ref 14–54)
AST: 23 U/L (ref 15–41)
Albumin: 4 g/dL (ref 3.5–5.0)
Anion gap: 8 (ref 5–15)
BILIRUBIN TOTAL: 0.7 mg/dL (ref 0.3–1.2)
BUN: 7 mg/dL (ref 6–20)
CALCIUM: 9 mg/dL (ref 8.9–10.3)
CO2: 28 mmol/L (ref 22–32)
CREATININE: 0.46 mg/dL (ref 0.44–1.00)
Chloride: 96 mmol/L — ABNORMAL LOW (ref 101–111)
GFR calc non Af Amer: 59 mL/min — ABNORMAL LOW (ref >60)
GLUCOSE: 90 mg/dL (ref 65–99)
Potassium: 3.1 mmol/L — ABNORMAL LOW (ref 3.5–5.1)
SODIUM: 132 mmol/L — AB (ref 135–145)
TOTAL PROTEIN: 8 g/dL (ref 6.5–8.1)

## 2016-09-26 LAB — CBC WITH DIFFERENTIAL/PLATELET
Basophils Absolute: 0 10*3/uL (ref 0.0–0.1)
Basophils Relative: 0 %
EOS ABS: 0 10*3/uL (ref 0.0–0.7)
Eosinophils Relative: 1 %
HEMATOCRIT: 32.1 % — AB (ref 36.0–46.0)
HEMOGLOBIN: 11.2 g/dL — AB (ref 12.0–15.0)
LYMPHS ABS: 1.1 10*3/uL (ref 0.7–4.0)
LYMPHS PCT: 21 %
MCH: 35.1 pg — AB (ref 26.0–34.0)
MCHC: 34.9 g/dL (ref 30.0–36.0)
MCV: 100.6 fL — ABNORMAL HIGH (ref 78.0–100.0)
MONOS PCT: 6 %
Monocytes Absolute: 0.4 10*3/uL (ref 0.1–1.0)
NEUTROS ABS: 4 10*3/uL (ref 1.7–7.7)
NEUTROS PCT: 72 %
Platelets: 306 10*3/uL (ref 150–400)
RBC: 3.19 MIL/uL — AB (ref 3.87–5.11)
RDW: 14.3 % (ref 11.5–15.5)
WBC: 5.5 10*3/uL (ref 4.0–10.5)

## 2016-09-26 LAB — I-STAT CG4 LACTIC ACID, ED: Lactic Acid, Venous: 1.58 mmol/L (ref 0.5–1.9)

## 2016-09-26 LAB — CK: CK TOTAL: 153 U/L (ref 38–234)

## 2016-09-26 LAB — ETHANOL: Alcohol, Ethyl (B): 291 mg/dL — ABNORMAL HIGH (ref <5)

## 2016-09-26 LAB — I-STAT TROPONIN, ED: Troponin i, poc: 0.01 ng/mL (ref 0.00–0.08)

## 2016-09-26 LAB — MAGNESIUM: Magnesium: 1.9 mg/dL (ref 1.7–2.4)

## 2016-09-26 LAB — AMMONIA: Ammonia: 14 umol/L (ref 9–35)

## 2016-09-26 MED ORDER — LORAZEPAM 2 MG/ML IJ SOLN: 0.0000 mg | Freq: Two times a day (BID) | INTRAMUSCULAR | Status: DC

## 2016-09-26 MED ORDER — SODIUM CHLORIDE 0.9 % IV SOLN
1000.0000 mL | Freq: Once | INTRAVENOUS | Status: AC
  Administered 2016-09-26: 1000 mL via INTRAVENOUS

## 2016-09-26 MED ORDER — THIAMINE HCL 100 MG/ML IJ SOLN
100.0000 mg | Freq: Once | INTRAMUSCULAR | Status: AC
  Administered 2016-09-26: 100 mg via INTRAVENOUS
  Filled 2016-09-26: qty 2

## 2016-09-26 MED ORDER — LORAZEPAM 2 MG/ML IJ SOLN: 0.0000 mg | Freq: Four times a day (QID) | INTRAMUSCULAR | Status: DC

## 2016-09-26 NOTE — ED Notes (Signed)
Patient resting comfortably.  Equal chest expansion with non-labored breathing

## 2016-09-26 NOTE — ED Triage Notes (Signed)
Per EMS-patient's roommate called related to her alcohol intoxication-states patient fell twice-has abrasions to both knees and between eyes-unable to understand

## 2016-09-26 NOTE — ED Notes (Signed)
Patient states her name is Lorraine Tapia.

## 2016-09-26 NOTE — ED Notes (Signed)
Patient very uncooperative during EKG. Patient frequently attempts to hit staff despite calming techniques.

## 2016-09-26 NOTE — ED Notes (Signed)
Bed: [facility] Expected date:  Expected time:  Means of arrival:  Comments: ETOH-ETOH

## 2016-09-26 NOTE — ED Provider Notes (Signed)
[facility] DEPT Provider Note   CSN: [id number] Arrival date & time: 09/26/16  1733     History   Chief Complaint Chief Complaint  Patient presents with  . Alcohol Intoxication    HPI  Level 5 caveat due to ams Lorraine Tapia is a 65 y.o. female. Female BIB EMS for etoh . The patient. The patient lives at a boarding house. She was very drunk today and once her roommates called EMS because she fell onto her face. The patient is too intoxicated to give a history at this point.   HPI  Past Medical History:  Diagnosis Date  . ETOH abuse     There are no active problems to display for this patient.   No past surgical history on file.  OB History    No data available       Home Medications    Prior to Admission medications   Not on File    Family History No family history on file.  Social History Social History  Substance Use Topics  . Smoking status: Not on file  . Smokeless tobacco: Not on file  . Alcohol use Not on file     Allergies   Review of patient's allergies indicates not on file.   Review of Systems Review of Systems  Unable to review systems due to altered mental status Physical Exam Updated Vital Signs BP 115/73   Pulse 67   Temp 97.6 F (36.4 C) (Oral) Comment: vitals taken by sitter.   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  Neck: Normal range of motion.  Cardiovascular: Normal rate and regular rhythm.   Pulmonary/Chest: Effort normal.  Abdominal: Soft.  Neurological: She is alert.  Garbled, drunken speech  Skin: Capillary refill takes less than 2 seconds.  Nursing note and vitals reviewed.    ED Treatments / Results  Labs (all labs ordered are listed, but only abnormal results are displayed) Labs  Reviewed  CBC WITH DIFFERENTIAL/PLATELET - Abnormal; Notable for the following:       Result Value   RBC 3.19 (*)    Hemoglobin 11.2 (*)    HCT 32.1 (*)    MCV 100.6 (*)    MCH 35.1 (*)    All other components within normal limits  COMPREHENSIVE METABOLIC PANEL - Abnormal; Notable for the following:    Sodium 132 (*)    Potassium 3.1 (*)    Chloride 96 (*)    GFR calc non Af Amer 59 (*)    All other components within normal limits  URINALYSIS, ROUTINE W REFLEX MICROSCOPIC (NOT AT [facility] [place]) - Abnormal; Notable for the following:    Specific Gravity, Urine 1.004 (*)    All other components within normal limits  ETHANOL - Abnormal; Notable for the following:    Alcohol, Ethyl (B) 291 (*)    All other components within normal limits  I-STAT CHEM 8, ED - Abnormal; Notable for the following:    Potassium 3.0 (*)    Chloride 94 (*)    BUN 5 (*)    Calcium, Ion 1.10 (*)    All other components within normal limits  AMMONIA  MAGNESIUM  CK  CBG MONITORING, ED  I-STAT CG4 LACTIC ACID, ED  Randolm Idol, ED  EKG  EKG Interpretation  Date/Time:  Thursday September 26 2016 19:02:50 EDT Ventricular Rate:  78 PR Interval:    QRS Duration: 81 QT Interval:  396 QTC Calculation: 452 R Axis:   67 Text Interpretation:  Sinus rhythm Ventricular premature complex Aberrant conduction of SV complex(es) Baseline wander in lead(s) V6 Confirmed by Lita Mains  MD, DAVID ([id number]) on 09/26/2016 7:13:02 PM       Radiology Dg Chest 1 View  Result Date: 09/26/2016 CLINICAL DATA:  65 y/o  F; ETOH abuse and fall. EXAM: CHEST 1 VIEW COMPARISON:  None. FINDINGS: The heart size and mediastinal contours are within normal limits. Both lungs are clear. The visualized skeletal structures are unremarkable. IMPRESSION: No active disease. Electronically Signed   By: Kristine Garbe M.D.   On: 09/26/2016 18:40   Ct Head Wo Contrast  Result Date: 09/26/2016 CLINICAL DATA:  Patient fell twice.  Alcohol intoxication. Abrasions between eyes. Difficulty understanding. EXAM: CT HEAD WITHOUT CONTRAST CT MAXILLOFACIAL WITHOUT CONTRAST CT CERVICAL SPINE WITHOUT CONTRAST TECHNIQUE: Multidetector CT imaging of the head, cervical spine, and maxillofacial structures were performed using the standard protocol without intravenous contrast. Multiplanar CT image reconstructions of the cervical spine and maxillofacial structures were also generated. COMPARISON:  None. FINDINGS: CTHEADMAXCERVICALCT HEAD FINDINGS BRAIN: The ventricles and sulci are normal. Minimal bilateral small vessel ischemic changes of periventricular white matter are noted. No intraparenchymal hemorrhage, mass effect nor midline shift. No acute large vascular territory infarcts. No abnormal extra-axial fluid collections. Basal cisterns are patent. VASCULAR: Unremarkable. SKULL/SOFT TISSUES: No skull fracture. Left supraorbital soft tissue swelling of the forehead. OTHER: Mild dural calcifications of incidental note along the anterior aspect of the temporal lobes and along the falx. CT MAXILLOFACIAL FINDINGS OSSEOUS: The mandible is intact, the condyles are located. There is subtle old fracture deformity of the left mandibular condyles. Bilateral nasal bone fractures with slight depression noted. The zygomatic arches are intact. The orbital walls are maintained. No destructive bony lesions. ORBITS: Ocular globes and orbital contents are normal. SINUSES: Moderate opacification of the left maxillary sinus with air-fluid level. Mild circumferential mucosal thickening of the right maxillary sinus. Slight ethmoid sinus mucosal thickening. Levoconvex curvature of the nasal septum without definite fracture. Included mastoid air cells are well aerated. SOFT TISSUES: Left supraorbital forehead soft tissue swelling as above. No subcutaneous gas or radiopaque foreign bodies. CT CERVICAL SPINE FINDINGS ALIGNMENT: Cervical vertebral bodies in alignment. Maintenance  of cervical lordosis. SKULL BASE AND VERTEBRAE: Cervical vertebral bodies and posterior elements are intact. Intervertebral disc heights preserved. No destructive bony lesions. C1-2 articulation maintained. Minimally with ossification posterior to the C6 spinous process of incidental note. Small anterior osteophytes are noted C4 through C6 are the inferior endplates. SOFT TISSUES AND SPINAL CANAL: Included prevertebral and paraspinal soft tissues are normal. DISC LEVELS: No significant osseous canal stenosis or neural foraminal narrowing. Mild disc central disc bulge at C3-4. UPPER CHEST: Pleural parenchymal thickening and scarring at the right lung apex. Extra cranial carotid arteriosclerosis. Multiple vascular clips are seen in the submandibular soft tissues on the right. OTHER: None. IMPRESSION: Minimally depressed bilateral nasal bone fractures with left supraorbital soft tissue swelling. Sinusitis of the maxillary sinuses acute on chronic on the left with air-fluid level. Minimal small vessel ischemic disease of periventricular white matter. No acute intracranial abnormality. No acute cervical spinal abnormality. Electronically Signed   By: Ashley Royalty M.D.   On: 09/26/2016 18:43   Ct Cervical Spine Wo Contrast  Result Date: 09/26/2016 CLINICAL  DATA:  Patient fell twice. Alcohol intoxication. Abrasions between eyes. Difficulty understanding. EXAM: CT HEAD WITHOUT CONTRAST CT MAXILLOFACIAL WITHOUT CONTRAST CT CERVICAL SPINE WITHOUT CONTRAST TECHNIQUE: Multidetector CT imaging of the head, cervical spine, and maxillofacial structures were performed using the standard protocol without intravenous contrast. Multiplanar CT image reconstructions of the cervical spine and maxillofacial structures were also generated. COMPARISON:  None. FINDINGS: CTHEADMAXCERVICALCT HEAD FINDINGS BRAIN: The ventricles and sulci are normal. Minimal bilateral small vessel ischemic changes of periventricular white matter are noted.  No intraparenchymal hemorrhage, mass effect nor midline shift. No acute large vascular territory infarcts. No abnormal extra-axial fluid collections. Basal cisterns are patent. VASCULAR: Unremarkable. SKULL/SOFT TISSUES: No skull fracture. Left supraorbital soft tissue swelling of the forehead. OTHER: Mild dural calcifications of incidental note along the anterior aspect of the temporal lobes and along the falx. CT MAXILLOFACIAL FINDINGS OSSEOUS: The mandible is intact, the condyles are located. There is subtle old fracture deformity of the left mandibular condyles. Bilateral nasal bone fractures with slight depression noted. The zygomatic arches are intact. The orbital walls are maintained. No destructive bony lesions. ORBITS: Ocular globes and orbital contents are normal. SINUSES: Moderate opacification of the left maxillary sinus with air-fluid level. Mild circumferential mucosal thickening of the right maxillary sinus. Slight ethmoid sinus mucosal thickening. Levoconvex curvature of the nasal septum without definite fracture. Included mastoid air cells are well aerated. SOFT TISSUES: Left supraorbital forehead soft tissue swelling as above. No subcutaneous gas or radiopaque foreign bodies. CT CERVICAL SPINE FINDINGS ALIGNMENT: Cervical vertebral bodies in alignment. Maintenance of cervical lordosis. SKULL BASE AND VERTEBRAE: Cervical vertebral bodies and posterior elements are intact. Intervertebral disc heights preserved. No destructive bony lesions. C1-2 articulation maintained. Minimally with ossification posterior to the C6 spinous process of incidental note. Small anterior osteophytes are noted C4 through C6 are the inferior endplates. SOFT TISSUES AND SPINAL CANAL: Included prevertebral and paraspinal soft tissues are normal. DISC LEVELS: No significant osseous canal stenosis or neural foraminal narrowing. Mild disc central disc bulge at C3-4. UPPER CHEST: Pleural parenchymal thickening and scarring at the  right lung apex. Extra cranial carotid arteriosclerosis. Multiple vascular clips are seen in the submandibular soft tissues on the right. OTHER: None. IMPRESSION: Minimally depressed bilateral nasal bone fractures with left supraorbital soft tissue swelling. Sinusitis of the maxillary sinuses acute on chronic on the left with air-fluid level. Minimal small vessel ischemic disease of periventricular white matter. No acute intracranial abnormality. No acute cervical spinal abnormality. Electronically Signed   By: Ashley Royalty M.D.   On: 09/26/2016 18:43   Ct Maxillofacial Wo Contrast  Result Date: 09/26/2016 CLINICAL DATA:  Patient fell twice. Alcohol intoxication. Abrasions between eyes. Difficulty understanding. EXAM: CT HEAD WITHOUT CONTRAST CT MAXILLOFACIAL WITHOUT CONTRAST CT CERVICAL SPINE WITHOUT CONTRAST TECHNIQUE: Multidetector CT imaging of the head, cervical spine, and maxillofacial structures were performed using the standard protocol without intravenous contrast. Multiplanar CT image reconstructions of the cervical spine and maxillofacial structures were also generated. COMPARISON:  None. FINDINGS: CTHEADMAXCERVICALCT HEAD FINDINGS BRAIN: The ventricles and sulci are normal. Minimal bilateral small vessel ischemic changes of periventricular white matter are noted. No intraparenchymal hemorrhage, mass effect nor midline shift. No acute large vascular territory infarcts. No abnormal extra-axial fluid collections. Basal cisterns are patent. VASCULAR: Unremarkable. SKULL/SOFT TISSUES: No skull fracture. Left supraorbital soft tissue swelling of the forehead. OTHER: Mild dural calcifications of incidental note along the anterior aspect of the temporal lobes and along the falx. CT MAXILLOFACIAL FINDINGS OSSEOUS: The mandible  is intact, the condyles are located. There is subtle old fracture deformity of the left mandibular condyles. Bilateral nasal bone fractures with slight depression noted. The zygomatic  arches are intact. The orbital walls are maintained. No destructive bony lesions. ORBITS: Ocular globes and orbital contents are normal. SINUSES: Moderate opacification of the left maxillary sinus with air-fluid level. Mild circumferential mucosal thickening of the right maxillary sinus. Slight ethmoid sinus mucosal thickening. Levoconvex curvature of the nasal septum without definite fracture. Included mastoid air cells are well aerated. SOFT TISSUES: Left supraorbital forehead soft tissue swelling as above. No subcutaneous gas or radiopaque foreign bodies. CT CERVICAL SPINE FINDINGS ALIGNMENT: Cervical vertebral bodies in alignment. Maintenance of cervical lordosis. SKULL BASE AND VERTEBRAE: Cervical vertebral bodies and posterior elements are intact. Intervertebral disc heights preserved. No destructive bony lesions. C1-2 articulation maintained. Minimally with ossification posterior to the C6 spinous process of incidental note. Small anterior osteophytes are noted C4 through C6 are the inferior endplates. SOFT TISSUES AND SPINAL CANAL: Included prevertebral and paraspinal soft tissues are normal. DISC LEVELS: No significant osseous canal stenosis or neural foraminal narrowing. Mild disc central disc bulge at C3-4. UPPER CHEST: Pleural parenchymal thickening and scarring at the right lung apex. Extra cranial carotid arteriosclerosis. Multiple vascular clips are seen in the submandibular soft tissues on the right. OTHER: None. IMPRESSION: Minimally depressed bilateral nasal bone fractures with left supraorbital soft tissue swelling. Sinusitis of the maxillary sinuses acute on chronic on the left with air-fluid level. Minimal small vessel ischemic disease of periventricular white matter. No acute intracranial abnormality. No acute cervical spinal abnormality. Electronically Signed   By: Ashley Royalty M.D.   On: 09/26/2016 18:43    Procedures Procedures (including critical care time)  Medications Ordered in  ED Medications  LORazepam (ATIVAN) injection 0-4 mg (0 mg Intravenous Not Given 09/27/16 0031)    Followed by  LORazepam (ATIVAN) injection 0-4 mg (not administered)  0.9 %  sodium chloride infusion (0 mLs Intravenous Stopped 09/26/16 2000)  thiamine (B-1) injection 100 mg (100 mg Intravenous Given 09/26/16 1854)     Initial Impression / Assessment and Plan / ED Course  I have reviewed the triage vital signs and the nursing notes.  Pertinent labs & imaging results that were available during my care of the patient were reviewed by me and considered in my medical decision making (see chart for details).  Clinical Course  Value Comment By Time  Potassium: (!) 3.0 (Reviewed) Margarita Mail, PA-C 10/26 1914  Creatinine: 0.90 Normal creatinine  Margarita Mail, PA-C 10/26 1914  Troponin i, poc: 0.01 (Reviewed) Margarita Mail, PA-C 10/26 1916   Patient is now awake and alert. She is still clearly intoxicated and needs to stay for sobriety. She is eating soup at this time Margarita Mail, PA-C 10/27 0001    Patient still intoxicated. I have given sign out to Dr. Christy Gentles who will assume care.  Final Clinical Impressions(s) / ED Diagnoses   Final diagnoses:  Fall  Alcoholic intoxication without complication [facility])    New Prescriptions New Prescriptions   No medications on file     Margarita Mail, PA-C 09/27/16 0118    Julianne Rice, MD 09/27/16 [phone]

## 2016-09-26 NOTE — ED Notes (Signed)
Pt noted to be throwing herself off the bed into the floor.  Patient refusing to get out of bed and wanting to leave saying she is sober.  Patient unable to stand on her own.  Patient threatening staff.  PA made aware.  Safety soft wrist restraints verbally ordered for patient.  PA will put order in.

## 2016-09-27 ENCOUNTER — Encounter (HOSPITAL_COMMUNITY): Payer: Self-pay | Admitting: Emergency Medicine

## 2016-09-27 DIAGNOSIS — S022XXA Fracture of nasal bones, initial encounter for closed fracture: Secondary | ICD-10-CM | POA: Diagnosis not present

## 2016-09-27 LAB — ETHANOL: Alcohol, Ethyl (B): 27 mg/dL — ABNORMAL HIGH (ref <5)

## 2016-09-27 NOTE — ED Notes (Signed)
Bed: [id number] Expected date:  Expected time:  Means of arrival:  Comments: Hold for hall C

## 2016-09-27 NOTE — ED Provider Notes (Signed)
Pt improved She is ambulatory Advised of her nasal fracture Appropriate for d/c home    Ripley Fraise, MD 09/27/16 [phone]

## 2016-09-27 NOTE — ED Notes (Signed)
Pt ambulated self to bathroom with supervision of staff. Pt with steady gait at this time.

## 2016-09-29 NOTE — ED Provider Notes (Signed)
[facility] DEPT Provider Note   CSN: [id number] Arrival date & time: 09/17/16  [id number]     History   Chief Complaint Chief Complaint  Patient presents with  . Psychiatric Evaluation  . IVC    HPI PARILEE NYHUS is a 65 y.o. female.  Patient presents for evaluation of bizarre behavior. Brought in by EMS and police. History recent incarceration. History of psychosis. Has not received medical care since being released from jail. [facility]lord called tonight that she was screaming and acting bizarre and was brought in for evaluation.  HPI  Past Medical History:  Diagnosis Date  . Bipolar disorder ([place])   . Cancer ([place]) 10-19-2013   mucosa cancer  . Depression   . ETOH abuse   . Hx of radiation therapy 02/21/14-04/06/14   right buccal 66y/22fx  . Psychiatric disorder   . Shortness of breath    with exertion    Patient Active Problem List   Diagnosis Date Noted  . Schizophrenia ([place]) 09/18/2016  . Unintentional weight loss 06/25/2016  . Edentulism 06/25/2016  . Seizures ([place])   . Vitamin D deficiency 03/27/2015  . RBC microcytosis 03/27/2015  . Heavy smoker 03/24/2015  . ETOH abuse 03/24/2015  . Bipolar disorder with psychotic features ([place]) 09/27/2014  . Other malignant neoplasm without specification of site 02/01/2014  . HTN (hypertension) 02/01/2014  . 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 Adenocarcinoma ([place]) 09/01/2013  . 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 MULTIPLE EXTRACTIONS WITH ALVEOLOPLASTY N/A 02/01/2014   Procedure: Extraction of tooth #'s 6,7,8,10,11,12,15,16,22,23,25,26,27 with alveoloplasty.;  Surgeon: Lenn Cal, DDS;  Location: [facility] ORS;  Service: Oral Surgery;  Laterality: N/A;  ANESTHESIA: GENERAL WITH NASAL TUBE  . Right buccal mucosa composite resection  10/19/2013   S/P right buccal mucosa composite resection with flap reconstruction and right modified  neck dissection on 10/20/2039 by Dr. Fredricka Bonine at [facility] History    No data available       Home Medications    Prior to Admission medications   Medication Sig Start Date End Date Taking? Authorizing Provider  folic acid (FOLVITE) 1 MG tablet Take 1 tablet (1 mg total) by mouth daily. Patient not taking: Reported on 09/17/2016 06/25/16   Boykin Nearing, MD  Multiple Vitamins-Minerals (MULTIVITAMIN) tablet Take 1 tablet by mouth daily. Patient not taking: Reported on 09/17/2016 06/25/16   Boykin Nearing, MD  thiamine (VITAMIN B-1) 100 MG tablet Take 1 tablet (100 mg total) by mouth daily. Patient not taking: Reported on 09/17/2016 06/25/16   Boykin Nearing, MD    Family History Family History  Problem Relation Age of Onset  . Cancer Mother   . Hypertension Mother   . Hypertension Father     Social History Social History  Substance Use Topics  . Smoking status: Not on file  . Smokeless tobacco: Not on file  . Alcohol use 1.2 oz/week    2 Cans of beer per week     Comment:  2 24  oz beer occasionally     Allergies   Other   Review of Systems Review of Systems  Unable to perform ROS: Psychiatric disorder     Physical Exam Updated Vital Signs BP 113/74 (BP Location: Right Arm)   Pulse 85   Temp 98.1 F (36.7 C) (Oral)   Resp 16   SpO2 93%   Physical Exam  Constitutional:  She appears well-developed and well-nourished. No distress.  HENT:  Head: Normocephalic.  Eyes: Conjunctivae are normal. Pupils are equal, round, and reactive to light. No scleral icterus.  Neck: Normal range of motion. Neck supple. No thyromegaly present.  Cardiovascular: Normal rate and regular rhythm.  Exam reveals no gallop and no friction rub.   No murmur heard. Pulmonary/Chest: Effort normal and breath sounds normal. No respiratory distress. She has no wheezes. She has no rales.  Abdominal: Soft. Bowel sounds are normal. She exhibits no distension. There  is no tenderness. There is no rebound.  Musculoskeletal: Normal range of motion.  Neurological: She is alert.  Skin: Skin is warm and dry. No rash noted.  Psychiatric:  Mumbling and perseverating. Intermittently agitated.     ED Treatments / Results  Labs (all labs ordered are listed, but only abnormal results are displayed) Labs Reviewed  CBC WITH DIFFERENTIAL/PLATELET - Abnormal; Notable for the following:       Result Value   RBC 3.40 (*)    HCT 35.0 (*)    MCV 102.9 (*)    MCH 35.6 (*)    RDW 16.4 (*)    Lymphs Abs 0.6 (*)    All other components within normal limits  COMPREHENSIVE METABOLIC PANEL - Abnormal; Notable for the following:    Potassium 3.0 (*)    Glucose, Bld 113 (*)    Total Protein 8.2 (*)    Total Bilirubin 1.5 (*)    All other components within normal limits  ACETAMINOPHEN LEVEL - Abnormal; Notable for the following:    Acetaminophen (Tylenol), Serum <10 (*)    All other components within normal limits  RAPID URINE DRUG SCREEN, HOSP PERFORMED - Abnormal; Notable for the following:    Benzodiazepines POSITIVE (*)    All other components within normal limits  URINALYSIS, ROUTINE W REFLEX MICROSCOPIC (NOT AT [facility])  ETHANOL  SALICYLATE LEVEL    EKG  EKG Interpretation  Date/Time:  Tuesday September 17 2016 15:28:18 EDT Ventricular Rate:  89 PR Interval:    QRS Duration: 77 QT Interval:  355 QTC Calculation: 432 R Axis:   87 Text Interpretation:  Sinus rhythm Anteroseptal infarct, age indeterminate Baseline wander in lead(s) I III aVL Interpretation limited secondary to artifact Confirmed by KNOTT MD, Quillian Quince [id number]) on 09/18/2016 5:31:43 PM       Radiology No results found.  Procedures Procedures (including critical care time)  Medications Ordered in ED Medications  haloperidol lactate (HALDOL) injection 5 mg (5 mg Intramuscular Given 09/17/16 1324)  ziprasidone (GEODON) injection 20 mg (20 mg Intramuscular Given 09/17/16 1603)    haloperidol lactate (HALDOL) injection 5 mg (5 mg Intramuscular Given 09/17/16 1715)  LORazepam (ATIVAN) injection 4 mg (4 mg Intramuscular Given 09/17/16 1715)     Initial Impression / Assessment and Plan / ED Course  I have reviewed the triage vital signs and the nursing notes.  Pertinent labs & imaging results that were available during my care of the patient were reviewed by me and considered in my medical decision making (see chart for details).  Clinical Course    Patient given Haldol, Geodon, Ativan. Await psychiatric evaluation. Medically clear.  Final Clinical Impressions(s) / ED Diagnoses   Final diagnoses:  Other schizophrenia [facility])    New Prescriptions Discharge Medication List as of 09/18/2016 11:46 AM       Tanna Furry, MD 09/29/16 2036

## 2017-05-15 ENCOUNTER — Ambulatory Visit
Admission: RE | Admit: 2017-05-15 | Discharge: 2017-05-15 | Disposition: A | Discharge status: Home / Self Care | Payer: Medicaid Other | Source: Ambulatory Visit | Attending: Family Medicine | Admitting: Family Medicine

## 2017-05-15 ENCOUNTER — Ambulatory Visit
Admission: RE | Admit: 2017-05-15 | Discharge: 2017-05-15 | Disposition: A | Discharge status: Home / Self Care | Payer: Medicare Other | Source: Ambulatory Visit | Attending: Family Medicine | Admitting: Family Medicine

## 2017-05-15 ENCOUNTER — Other Ambulatory Visit: Payer: Self-pay | Admitting: Family Medicine

## 2017-05-15 DIAGNOSIS — M8589 Other specified disorders of bone density and structure, multiple sites: Secondary | ICD-10-CM | POA: Diagnosis not present

## 2017-05-15 DIAGNOSIS — Z1231 Encounter for screening mammogram for malignant neoplasm of breast: Secondary | ICD-10-CM | POA: Diagnosis not present

## 2017-05-15 DIAGNOSIS — M8580 Other specified disorders of bone density and structure, unspecified site: Secondary | ICD-10-CM

## 2017-05-15 DIAGNOSIS — Z780 Asymptomatic menopausal state: Secondary | ICD-10-CM | POA: Diagnosis not present

## 2017-05-15 DIAGNOSIS — M85859 Other specified disorders of bone density and structure, unspecified thigh: Secondary | ICD-10-CM

## 2017-05-15 DIAGNOSIS — E2839 Other primary ovarian failure: Secondary | ICD-10-CM

## 2017-05-15 MED ORDER — CALCIUM CITRATE 200 MG PO TABS: 400.0000 mg | ORAL_TABLET | Freq: Every day | ORAL | 3 refills | Status: DC

## 2017-05-15 MED ORDER — VITAMIN D (ERGOCALCIFEROL) 1.25 MG (50000 UNIT) PO CAPS: 50000.0000 [IU] | ORAL_CAPSULE | ORAL | 3 refills | Status: AC

## 2017-05-22 ENCOUNTER — Telehealth: Payer: Self-pay

## 2017-05-22 NOTE — Telephone Encounter (Signed)
Pt was called and informed to return phone call for lab results. 

## 2017-05-24 ENCOUNTER — Encounter (HOSPITAL_COMMUNITY): Payer: Self-pay | Admitting: Nurse Practitioner

## 2017-05-24 ENCOUNTER — Emergency Department (HOSPITAL_COMMUNITY): Payer: Medicare Other

## 2017-05-24 ENCOUNTER — Emergency Department (HOSPITAL_COMMUNITY)
Admission: EM | Admit: 2017-05-24 | Discharge: 2017-05-28 | Disposition: A | Discharge status: Home / Self Care | Payer: Medicare Other | Attending: Emergency Medicine | Admitting: Emergency Medicine

## 2017-05-24 ENCOUNTER — Other Ambulatory Visit: Payer: Self-pay

## 2017-05-24 DIAGNOSIS — F99 Mental disorder, not otherwise specified: Secondary | ICD-10-CM | POA: Diagnosis not present

## 2017-05-24 DIAGNOSIS — F209 Schizophrenia, unspecified: Secondary | ICD-10-CM | POA: Diagnosis not present

## 2017-05-24 DIAGNOSIS — Z79899 Other long term (current) drug therapy: Secondary | ICD-10-CM | POA: Diagnosis not present

## 2017-05-24 DIAGNOSIS — F319 Bipolar disorder, unspecified: Secondary | ICD-10-CM | POA: Diagnosis present

## 2017-05-24 DIAGNOSIS — I10 Essential (primary) hypertension: Secondary | ICD-10-CM | POA: Insufficient documentation

## 2017-05-24 DIAGNOSIS — F200 Paranoid schizophrenia: Secondary | ICD-10-CM | POA: Diagnosis not present

## 2017-05-24 DIAGNOSIS — Z046 Encounter for general psychiatric examination, requested by authority: Secondary | ICD-10-CM | POA: Diagnosis present

## 2017-05-24 DIAGNOSIS — F29 Unspecified psychosis not due to a substance or known physiological condition: Secondary | ICD-10-CM | POA: Insufficient documentation

## 2017-05-24 DIAGNOSIS — F17200 Nicotine dependence, unspecified, uncomplicated: Secondary | ICD-10-CM | POA: Diagnosis not present

## 2017-05-24 DIAGNOSIS — R45851 Suicidal ideations: Secondary | ICD-10-CM | POA: Diagnosis not present

## 2017-05-24 DIAGNOSIS — Z9114 Patient's other noncompliance with medication regimen: Secondary | ICD-10-CM | POA: Diagnosis not present

## 2017-05-24 DIAGNOSIS — Z0000 Encounter for general adult medical examination without abnormal findings: Secondary | ICD-10-CM

## 2017-05-24 DIAGNOSIS — Z85818 Personal history of malignant neoplasm of other sites of lip, oral cavity, and pharynx: Secondary | ICD-10-CM | POA: Diagnosis not present

## 2017-05-24 DIAGNOSIS — F1010 Alcohol abuse, uncomplicated: Secondary | ICD-10-CM | POA: Diagnosis not present

## 2017-05-24 DIAGNOSIS — R918 Other nonspecific abnormal finding of lung field: Secondary | ICD-10-CM | POA: Diagnosis not present

## 2017-05-24 LAB — CBC
HEMATOCRIT: 34.3 % — AB (ref 36.0–46.0)
Hemoglobin: 12.2 g/dL (ref 12.0–15.0)
MCH: 34.6 pg — ABNORMAL HIGH (ref 26.0–34.0)
MCHC: 35.6 g/dL (ref 30.0–36.0)
MCV: 97.2 fL (ref 78.0–100.0)
Platelets: 275 10*3/uL (ref 150–400)
RBC: 3.53 MIL/uL — ABNORMAL LOW (ref 3.87–5.11)
RDW: 14 % (ref 11.5–15.5)
WBC: 7.3 10*3/uL (ref 4.0–10.5)

## 2017-05-24 LAB — COMPREHENSIVE METABOLIC PANEL
ALBUMIN: 4.8 g/dL (ref 3.5–5.0)
ALK PHOS: 63 U/L (ref 38–126)
ALT: 14 U/L (ref 14–54)
AST: 20 U/L (ref 15–41)
Anion gap: 10 (ref 5–15)
BILIRUBIN TOTAL: 1.1 mg/dL (ref 0.3–1.2)
BUN: 14 mg/dL (ref 6–20)
CO2: 25 mmol/L (ref 22–32)
CREATININE: 0.77 mg/dL (ref 0.44–1.00)
Calcium: 9.8 mg/dL (ref 8.9–10.3)
Chloride: 105 mmol/L (ref 101–111)
GFR calc Af Amer: >60 mL/min (ref >60)
GFR calc non Af Amer: >60 mL/min (ref >60)
GLUCOSE: 103 mg/dL — AB (ref 65–99)
Potassium: 3.4 mmol/L — ABNORMAL LOW (ref 3.5–5.1)
Sodium: 140 mmol/L (ref 135–145)
TOTAL PROTEIN: 8.5 g/dL — AB (ref 6.5–8.1)

## 2017-05-24 LAB — URINALYSIS, ROUTINE W REFLEX MICROSCOPIC
Bilirubin Urine: NEGATIVE
GLUCOSE, UA: NEGATIVE mg/dL
HGB URINE DIPSTICK: NEGATIVE
Ketones, ur: NEGATIVE mg/dL
Leukocytes, UA: NEGATIVE
Nitrite: NEGATIVE
PH: 7 (ref 5.0–8.0)
PROTEIN: NEGATIVE mg/dL
Specific Gravity, Urine: 1.009 (ref 1.005–1.030)

## 2017-05-24 LAB — RAPID URINE DRUG SCREEN, HOSP PERFORMED
Amphetamines: NOT DETECTED
BARBITURATES: NOT DETECTED
Benzodiazepines: NOT DETECTED
Cocaine: NOT DETECTED
Opiates: NOT DETECTED
TETRAHYDROCANNABINOL: NOT DETECTED

## 2017-05-24 LAB — ACETAMINOPHEN LEVEL: Acetaminophen (Tylenol), Serum: <10 ug/mL — ABNORMAL LOW (ref 10–30)

## 2017-05-24 LAB — ETHANOL: Alcohol, Ethyl (B): <5 mg/dL (ref <5)

## 2017-05-24 LAB — SALICYLATE LEVEL: Salicylate Lvl: <7 mg/dL (ref 2.8–30.0)

## 2017-05-24 MED ORDER — ZIPRASIDONE MESYLATE 20 MG IM SOLR
20.0000 mg | INTRAMUSCULAR | Status: AC | PRN
  Administered 2017-05-26: 20 mg via INTRAMUSCULAR
  Filled 2017-05-24: qty 20

## 2017-05-24 MED ORDER — LORAZEPAM 2 MG/ML IJ SOLN: 0.0000 mg | Freq: Two times a day (BID) | INTRAMUSCULAR | Status: AC

## 2017-05-24 MED ORDER — LORAZEPAM 1 MG PO TABS
1.0000 mg | ORAL_TABLET | ORAL | Status: DC | PRN
  Filled 2017-05-24 (×2): qty 1

## 2017-05-24 MED ORDER — ZOLPIDEM TARTRATE 5 MG PO TABS: 5.0000 mg | ORAL_TABLET | Freq: Every evening | ORAL | Status: DC | PRN

## 2017-05-24 MED ORDER — THIAMINE HCL 100 MG/ML IJ SOLN
100.0000 mg | Freq: Every day | INTRAMUSCULAR | Status: DC
  Filled 2017-05-24: qty 2

## 2017-05-24 MED ORDER — ADULT MULTIVITAMIN W/MINERALS CH
1.0000 | ORAL_TABLET | Freq: Every day | ORAL | Status: DC
  Administered 2017-05-24 – 2017-05-27 (×5): 1 via ORAL
  Filled 2017-05-24 (×5): qty 1

## 2017-05-24 MED ORDER — LORAZEPAM 2 MG/ML IJ SOLN: 0.0000 mg | Freq: Four times a day (QID) | INTRAMUSCULAR | Status: DC

## 2017-05-24 MED ORDER — OLANZAPINE 10 MG PO TBDP
10.0000 mg | ORAL_TABLET | Freq: Three times a day (TID) | ORAL | Status: DC | PRN
  Administered 2017-05-24 – 2017-05-27 (×4): 10 mg via ORAL
  Filled 2017-05-24 (×5): qty 1

## 2017-05-24 MED ORDER — FOLIC ACID 1 MG PO TABS
1.0000 mg | ORAL_TABLET | Freq: Every day | ORAL | Status: DC
  Administered 2017-05-24 – 2017-05-26 (×4): 1 mg via ORAL
  Filled 2017-05-24 (×5): qty 1

## 2017-05-24 MED ORDER — LORAZEPAM 2 MG/ML IJ SOLN
1.0000 mg | Freq: Once | INTRAMUSCULAR | Status: AC
  Administered 2017-05-24: 1 mg via INTRAMUSCULAR

## 2017-05-24 MED ORDER — TRAZODONE HCL 100 MG PO TABS
100.0000 mg | ORAL_TABLET | Freq: Every evening | ORAL | Status: DC | PRN
Start: 2017-05-24 — End: 2017-05-28
  Administered 2017-05-26 – 2017-05-27 (×2): 100 mg via ORAL
  Filled 2017-05-24 (×3): qty 1

## 2017-05-24 MED ORDER — LORAZEPAM 2 MG/ML IJ SOLN
INTRAMUSCULAR | Status: AC
Start: 2017-05-24 — End: 2017-05-24
  Filled 2017-05-24: qty 1

## 2017-05-24 MED ORDER — ASENAPINE MALEATE 5 MG SL SUBL
10.0000 mg | SUBLINGUAL_TABLET | Freq: Two times a day (BID) | SUBLINGUAL | Status: DC
  Administered 2017-05-24 (×2): 10 mg via SUBLINGUAL
  Filled 2017-05-24 (×2): qty 2

## 2017-05-24 MED ORDER — VITAMIN D (ERGOCALCIFEROL) 1.25 MG (50000 UNIT) PO CAPS
50000.0000 [IU] | ORAL_CAPSULE | ORAL | Status: DC
  Filled 2017-05-24: qty 1

## 2017-05-24 MED ORDER — ZIPRASIDONE MESYLATE 20 MG IM SOLR
20.0000 mg | Freq: Once | INTRAMUSCULAR | Status: AC
  Administered 2017-05-24: 20 mg via INTRAMUSCULAR

## 2017-05-24 MED ORDER — ONDANSETRON HCL 4 MG PO TABS: 4.0000 mg | ORAL_TABLET | Freq: Three times a day (TID) | ORAL | Status: DC | PRN

## 2017-05-24 MED ORDER — LORAZEPAM 1 MG PO TABS
0.0000 mg | ORAL_TABLET | Freq: Four times a day (QID) | ORAL | Status: AC
  Administered 2017-05-25: 2 mg via ORAL
  Filled 2017-05-24: qty 2

## 2017-05-24 MED ORDER — OLANZAPINE 10 MG PO TBDP
10.0000 mg | ORAL_TABLET | Freq: Once | ORAL | Status: AC
Start: 2017-05-24 — End: 2017-05-24
  Administered 2017-05-24: 10 mg via ORAL
  Filled 2017-05-24: qty 1

## 2017-05-24 MED ORDER — ZIPRASIDONE MESYLATE 20 MG IM SOLR
10.0000 mg | Freq: Once | INTRAMUSCULAR | Status: AC
  Administered 2017-05-24: 10 mg via INTRAMUSCULAR

## 2017-05-24 MED ORDER — CALCIUM CITRATE 950 (200 CA) MG PO TABS
950.0000 mg | ORAL_TABLET | Freq: Every day | ORAL | Status: DC
  Administered 2017-05-25 – 2017-05-26 (×3): 950 mg via ORAL
  Filled 2017-05-24 (×5): qty 1

## 2017-05-24 MED ORDER — ZIPRASIDONE MESYLATE 20 MG IM SOLR
INTRAMUSCULAR | Status: AC
  Filled 2017-05-24: qty 20

## 2017-05-24 MED ORDER — LORAZEPAM 1 MG PO TABS
0.0000 mg | ORAL_TABLET | Freq: Two times a day (BID) | ORAL | Status: AC
  Administered 2017-05-26: 1 mg via ORAL
  Administered 2017-05-27: 4 mg via ORAL
  Filled 2017-05-24: qty 3

## 2017-05-24 MED ORDER — VITAMIN B-1 100 MG PO TABS
100.0000 mg | ORAL_TABLET | Freq: Every day | ORAL | Status: DC
  Administered 2017-05-24 – 2017-05-26 (×4): 100 mg via ORAL
  Filled 2017-05-24: qty 1

## 2017-05-24 MED ORDER — VITAMIN B-1 100 MG PO TABS
100.0000 mg | ORAL_TABLET | Freq: Every day | ORAL | Status: DC
  Administered 2017-05-25: 100 mg via ORAL
  Filled 2017-05-24 (×5): qty 1

## 2017-05-24 MED ORDER — ACETAMINOPHEN 325 MG PO TABS: 650.0000 mg | ORAL_TABLET | ORAL | Status: DC | PRN

## 2017-05-24 MED ORDER — STERILE WATER FOR INJECTION IJ SOLN
INTRAMUSCULAR | Status: AC
  Filled 2017-05-24: qty 10

## 2017-05-24 MED ORDER — LORAZEPAM 2 MG/ML IJ SOLN
0.0000 mg | Freq: Four times a day (QID) | INTRAMUSCULAR | Status: AC
  Administered 2017-05-25 (×2): 2 mg via INTRAMUSCULAR
  Filled 2017-05-24 (×3): qty 1

## 2017-05-24 MED ORDER — LORAZEPAM 1 MG PO TABS
0.0000 mg | ORAL_TABLET | Freq: Four times a day (QID) | ORAL | Status: DC
  Administered 2017-05-24: 2 mg via ORAL
  Administered 2017-05-24: 1 mg via ORAL
  Filled 2017-05-24: qty 1
  Filled 2017-05-24: qty 2

## 2017-05-24 NOTE — ED Triage Notes (Signed)
Pt remains agitated and in restraints with sitter at bedside. Pt continuously trying and succeeding at times to remove restraints. Strikes and tries to Actor. Meds given as ordered.

## 2017-05-24 NOTE — BH Assessment (Addendum)
Tele Assessment Note   Lorraine Tapia is an 66 y.o. female, who presents involuntary and unaccompanied at [facility]. Upon entering the pt's room the pt began yelling at clinician. Clinicain observed the pt take off one of her arm restraints. Pt's nurse entered the room to administer her medication however the pt tried to bite and head butt her nurse. Pt yelled at clinician, "you want to beat me up?" Pt reported, "it's cold, it's no electricity." Pt told another nurse assisting with her de-escalation, "I'll eat your brains." Pt reported, "you ain't got no damn money." Pt reported, "you win $500.00, that's no money." Pt reported, "I've been fucking institutionalized, this place is gonna burn." Pt denied, SI, HI, AVH, access to weapons. Clincian asked the pt if she has a history of self-injurious behaviors. Pt responded, "we not in no fucking [place]."   Per EDP note: "Lorraine Tapia is a 66 y.o. female who presents to the Emergency Department with a chief complaint of IVC paperwork that states she is a danger to herself, has not been taking her medications, has made statements that she should kill herself and is reportedly hearing voices. Pt wants to burn down this place (the hospital) and has been belligerent towards our staff. Pt has hot been drinking water at home. Pt denies SI."   Clinician was unable to assess: history of abuse, substance use, pt's access to weapons, living arrangements, martial status, guardianship, OPT resources, DSS involvement, legal involvement, orientation, previous inpatient admissions, sleep, ADLs, contact for safety. Pt's UDS is pending.   Pt presented alert in scrubs with argumentative, abusive, word salad speech. Pt's eye contact was poor. Pt's mood was irritable. Pt's affect was irritable. Pt's thought process was flight of ideas. Pt's judgement is impaired. Pt's concentration, insight, impulse control are poor.   Diagnosis: Bipolar Disorder [facility])  Past Medical History:   Past Medical History:  Diagnosis Date  . Bipolar disorder ([place])   . 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 Depression   . ETOH abuse   . Hx of radiation therapy 02/21/14-04/06/14   right buccal 66y/43fx  . Psychiatric disorder   . Shortness of breath    with exertion    Past Surgical History:  Procedure Laterality Date  . MULTIPLE EXTRACTIONS WITH ALVEOLOPLASTY N/A 02/01/2014   Procedure: Extraction of tooth #'s 6,7,8,10,11,12,15,16,22,23,25,26,27 with alveoloplasty.;  Surgeon: Lenn Cal, DDS;  Location: [facility] ORS;  Service: Oral Surgery;  Laterality: N/A;  ANESTHESIA: GENERAL WITH NASAL TUBE  . Right buccal mucosa composite resection  10/19/2013   S/P right buccal mucosa composite resection with flap reconstruction and right modified neck dissection on 10/20/2039 by Dr. Fredricka Bonine at [facility]    Family History:  Family History  Problem Relation Age of Onset  . Cancer Mother   . Hypertension Mother   . Hypertension Father     Social History:  reports that she has been smoking.  She does not have any smokeless tobacco history on file. She reports that she drinks about 1.2 oz of alcohol per week . She reports that she does not use drugs.  Additional Social History:  Alcohol / Drug Use Pain Medications: See MAR Prescriptions: See MAR  Over the Counter: See MAR History of alcohol / drug use?:  (Pending.)  CIWA: CIWA-Ar BP: (!) 182/102 Pulse Rate: 87 COWS:    PATIENT STRENGTHS: (choose at least two) Average or above average intelligence General fund of knowledge  Allergies:  Allergies  Allergen Reactions  . Other Itching    Rash and itching with pepper    Home Medications:  (Not in a hospital admission)  OB/GYN Status:  No LMP recorded. Patient is postmenopausal.  General Assessment Data Location of Assessment: [facility] ED TTS Assessment: In system Is this a Tele or Face-to-Face Assessment?: Face-to-Face Is this an Initial Assessment or a  Re-assessment for this encounter?: Initial Assessment Marital status: Other (comment) (UTA) Living Arrangements: Other (Comment) (UTA) Can pt return to current living arrangement?:  (UTA) Admission Status: Involuntary Referral Source:  (UTA) Insurance type: [place] Medicaid     Crisis Care Plan Living Arrangements: Other (Comment) (UTA) Legal Guardian: Other: (UTA) Name of Psychiatrist: [place] Name of Therapist: UTA  Education Status Is patient currently in school?:  (UTA) Current Grade: UTA Highest grade of school patient has completed: [employer] [place] Name of school: UTA Contact person: UTA  Risk to self with the past 6 months Suicidal Ideation:  (Per EDP note however pt denies. ) Has patient been a risk to self within the past 6 months prior to admission? : Other (comment) (UTA) Suicidal Intent:  (UTA) Has patient had any suicidal intent within the past 6 months prior to admission? : Other (comment) (UTA) Is patient at risk for suicide?:  (Pt denies.) Suicidal Plan?:  (UTA') Has patient had any suicidal plan within the past 6 months prior to admission? : Other (comment) (UTA) Access to Means:  (Pt denies.) What has been your use of drugs/alcohol within the last 12 months?: UDS is pending.  Previous Attempts/Gestures:  (UTA) How many times?:  (NA) Other Self Harm Risks: UTA Triggers for Past Attempts: Other (Comment) (UTA) Intentional Self Injurious Behavior:  (UTA) Family Suicide History: Unable to assess Recent stressful life event(s): Other (Comment) (UTA) Persecutory voices/beliefs?:  Pincus Badder) Depression:  (UTA) Depression Symptoms:  (UTA) Substance abuse history and/or treatment for substance abuse?:  (UTA) Suicide prevention information given to non-admitted patients:  (UTA)  Risk to Others within the past 6 months Homicidal Ideation: No (Pt denies. ) Does patient have any lifetime risk of violence toward others beyond the six months prior to admission? : Unknown Thoughts of  Harm to Others:  (UTA) Current Homicidal Intent: No Current Homicidal Plan: No Access to Homicidal Means:  (UTA) Identified Victim: UTA History of harm to others?:  (TUA) Assessment of Violence:  (UTA) Violent Behavior Description: UTA Does patient have access to weapons?: No (Pt denies. ) Criminal Charges Pending?:  (UTA) Does patient have a court date:  (UTA) Is patient on probation?:  (UTA)  Psychosis Hallucinations: None noted Delusions: Unspecified  Mental Status Report Appearance/Hygiene: In scrubs (in physical restraints. ) Eye Contact: Poor Motor Activity: Unremarkable Speech: Argumentative, Abusive, Word salad Level of Consciousness: Alert Mood: Irritable Affect: Irritable Anxiety Level: None Thought Processes: Flight of Ideas Judgement: Impaired Orientation: Unable to assess Obsessive Compulsive Thoughts/Behaviors: Unable to Assess  Cognitive Functioning Concentration: Poor Memory: Recent Impaired IQ: Average Insight: Poor Impulse Control: Poor Appetite:  (UTA) Weight Loss:  (UTA) Weight Gain:  (UTA) Sleep: Unable to Assess Total Hours of Sleep:  (UTA) Vegetative Symptoms: Unable to Assess  ADLScreening [facility] Assessment Services) Patient's cognitive ability adequate to safely complete daily activities?: Yes Patient able to express need for assistance with ADLs?:  (UTA) Independently performs ADLs?:  (UTA)  Prior Inpatient Therapy Prior Inpatient Therapy:  (UTA) Prior Therapy Dates: UTA Prior Therapy Facilty/Provider(s): UTA Reason for Treatment: UTA  Prior Outpatient Therapy Prior Outpatient Therapy:  (UTA) Prior Therapy  Dates: UTA Prior Therapy Facilty/Provider(s): UTA Reason for Treatment: UTA Does patient have an ACCT team?: Unknown Does patient have Intensive In-House Services?  : Unknown Does patient have [facility] services? : Unknown Does patient have P4CC services?: Unknown  ADL Screening (condition at time of admission) Patient's  cognitive ability adequate to safely complete daily activities?: Yes Is the patient deaf or have difficulty hearing?: No Does the patient have difficulty seeing, even when wearing glasses/contacts?:  (UTA) Does the patient have difficulty concentrating, remembering, or making decisions?: Yes Patient able to express need for assistance with ADLs?:  (UTA) Does the patient have difficulty dressing or bathing?:  (UTA) Independently performs ADLs?:  (UTA) Does the patient have difficulty walking or climbing stairs?:  (UTA) Weakness of Legs:  (UTA) Weakness of Arms/Hands:  (UTA)       Abuse/Neglect Assessment (Assessment to be complete while patient is alone) Physical Abuse:  (UTA) Verbal Abuse:  (UTA) Sexual Abuse:  (UTA) Exploitation of patient/patient's resources:  (UTA) Self-Neglect:  (UTA)     Advance Directives (For Healthcare) Does Patient Have a Medical Advance Directive?: No Would patient like information on creating a medical advance directive?: No - Patient declined    Additional Information 1:1 In Past 12 Months?: No CIRT Risk: Yes Elopement Risk: No Does patient have medical clearance?: Yes     Disposition: Lindon Romp, NP recommends gero-psychiatric treatment. Disposition discussed with Terri, Camera operator. TTS to seek placement.  Disposition Initial Assessment Completed for this Encounter: Yes Disposition of Patient: Inpatient treatment program Type of inpatient treatment program: Adult  Vertell Novak 05/24/2017 4:12 AM   Vertell Novak, MS, [facility], [facility] Triage Specialist [phone]

## 2017-05-24 NOTE — ED Notes (Signed)
Pt's belongings put up in the patient belongings 16-18 Resus A cabinet.

## 2017-05-24 NOTE — ED Notes (Signed)
Attempted to release restraints-patient kicking at Probation officer and attempting to bite this writer-verbally abusive to this Paramedic. Restraints reapplied. Patient offered tolieting and po fluids-patient just cursed this Probation officer.

## 2017-05-24 NOTE — ED Notes (Signed)
Bed: LN79 Expected date:  Expected time:  Means of arrival:  Comments: Resus B

## 2017-05-24 NOTE — ED Notes (Signed)
PT removed wrist restraints and held fist up in the air as if she was going to strike. Security made aware and remained  Present until restraints could be reapplied.

## 2017-05-24 NOTE — ED Triage Notes (Signed)
Pt is presented by [employer] with IVC paperwork stating pt is a danger to herself, has not been taking her meds, has made statements that she should kill herself and she is reportedly hearing voices.

## 2017-05-24 NOTE — ED Notes (Signed)
Sitter tried to help feed patient.  Patient spit on sitter.

## 2017-05-24 NOTE — ED Notes (Signed)
Patient continues to be verbally abusive to staff.  She sometimes is cooperative, then will have a burst of anger and throw food on the floor or at staff.  She has to be directed back to her room by staff and is unsteady on her feet and at times tries to hit and kick staff.

## 2017-05-24 NOTE — ED Notes (Signed)
Patient remains asleep from medications given in ED previously.  Patient no longer in restraints.  Sitter at bedside Will continue to monitor.

## 2017-05-24 NOTE — ED Notes (Addendum)
Pt was offered juice and began trying to bite Restaurant manager, fast food with her head.Pt request juice initially but then would not take juice.

## 2017-05-24 NOTE — ED Notes (Signed)
Pt standing pat foot of bed and out of restraints, cursing at staff, security called

## 2017-05-24 NOTE — ED Provider Notes (Addendum)
[facility] DEPT Provider Note   CSN: [id number] Arrival date & time: 05/24/17  0003  By signing my name below, I, Margit Banda, attest that this documentation has been prepared under the direction and in the presence of Varney Biles, MD. Electronically Signed: Margit Banda, ED Scribe. 05/24/17. 2:23 AM.  LEVEL V CAVEAT: HPI and ROS limited due to psychiatric disorder.   History   Chief Complaint No chief complaint on file.   HPI Lorraine Tapia is a 66 y.o. female who presents to the Emergency Department with a chief complaint of IVC paperwork that states she is a danger to herself, has not been taking her medications, has made statements that she should kill herself and is reportedly hearing voices.  Pt wants to burn down this place (the hospital) and has been belligerent towards our staff. Pt has hot been drinking water at home. Pt denies SI.  The history is provided by the patient. The history is limited by the condition of the patient. No language interpreter was used.    Past Medical History:  Diagnosis Date  . Bipolar disorder ([place])   . 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 Depression   . ETOH abuse   . Hx of radiation therapy 02/21/14-04/06/14   right buccal 66y/34fx  . Psychiatric disorder   . Shortness of breath    with exertion    Patient Active Problem List   Diagnosis Date Noted  . Osteopenia determined by x-ray 05/15/2017  . Schizophrenia ([place]) 09/18/2016  . Unintentional weight loss 06/25/2016  . Edentulism 06/25/2016  . Seizures ([place])   . Vitamin D deficiency 03/27/2015  . RBC microcytosis 03/27/2015  . Heavy smoker 03/24/2015  . ETOH abuse 03/24/2015  . Bipolar disorder with psychotic features ([place]) 09/27/2014  . Other malignant neoplasm without specification of site 02/01/2014  . HTN (hypertension) 02/01/2014  . 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 Adenocarcinoma ([place]) 09/01/2013  . 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 MULTIPLE EXTRACTIONS WITH ALVEOLOPLASTY N/A 02/01/2014   Procedure: Extraction of tooth #'s 6,7,8,10,11,12,15,16,22,23,25,26,27 with alveoloplasty.;  Surgeon: Lenn Cal, DDS;  Location: [facility] ORS;  Service: Oral Surgery;  Laterality: N/A;  ANESTHESIA: GENERAL WITH NASAL TUBE  . Right buccal mucosa composite resection  10/19/2013   S/P right buccal mucosa composite resection with flap reconstruction and right modified neck dissection on 10/20/2039 by Dr. Fredricka Bonine at [facility] History    No data available       Home Medications    Prior to Admission medications   Medication Sig Start Date End Date Taking? Authorizing Provider  Calcium Citrate 200 MG TABS Take 2 tablets (400 mg total) by mouth daily. Patient not taking: Reported on 05/25/2017 05/15/17   Boykin Nearing, MD  folic acid (FOLVITE) 1 MG tablet Take 1 tablet (1 mg total) by mouth daily. Patient not taking: Reported on 09/17/2016 06/25/16   Boykin Nearing, MD  Multiple Vitamins-Minerals (MULTIVITAMIN) tablet Take 1 tablet by mouth daily. Patient not taking: Reported on 09/17/2016 06/25/16   Boykin Nearing, MD  thiamine (VITAMIN B-1) 100 MG tablet Take 1 tablet (100 mg total) by mouth daily. Patient not taking: Reported on 09/17/2016 06/25/16   Boykin Nearing, MD  Vitamin D, Ergocalciferol, (DRISDOL) 50000 units CAPS capsule Take 1 capsule (50,000 Units total) by mouth every 30 (thirty) days. Patient not taking: Reported on 05/25/2017 05/15/17  Boykin Nearing, MD    Family History Family History  Problem Relation Age of Onset  . Cancer Mother   . Hypertension Mother   . Hypertension Father     Social History Social History  Substance Use Topics  . Smoking status: Current Some Day Smoker  . Smokeless tobacco: Not on file  . Alcohol use 1.2 oz/week    2 Cans of beer per week     Comment:  2 24  oz beer  occasionally     Allergies   Other   Review of Systems Review of Systems  Unable to perform ROS: Psychiatric disorder     Physical Exam Updated Vital Signs BP (!) 148/76 (BP Location: Right Arm)   Pulse (!) 52   Temp 97 F (36.1 C) (Oral)   Resp 18   SpO2 99%   Physical Exam  Constitutional: She appears well-developed and well-nourished.  HENT:  Head: Normocephalic.  Eyes: EOM are normal.  Neck: Normal range of motion.  Pulmonary/Chest: Effort normal.  Abdominal: She exhibits no distension.  Musculoskeletal: Normal range of motion.  Neurological: She is alert.  Psychiatric: She has a normal mood and affect.  Nursing note and vitals reviewed.    ED Treatments / Results  DIAGNOSTIC STUDIES: Oxygen Saturation is 97% on RA, normal by my interpretation.   Labs (all labs ordered are listed, but only abnormal results are displayed) Labs Reviewed  COMPREHENSIVE METABOLIC PANEL - Abnormal; Notable for the following:       Result Value   Potassium 3.4 (*)    Glucose, Bld 103 (*)    Total Protein 8.5 (*)    All other components within normal limits  ACETAMINOPHEN LEVEL - Abnormal; Notable for the following:    Acetaminophen (Tylenol), Serum <10 (*)    All other components within normal limits  CBC - Abnormal; Notable for the following:    RBC 3.53 (*)    HCT 34.3 (*)    MCH 34.6 (*)    All other components within normal limits  ETHANOL  SALICYLATE LEVEL  RAPID URINE DRUG SCREEN, HOSP PERFORMED  URINALYSIS, ROUTINE W REFLEX MICROSCOPIC    EKG  EKG Interpretation  Date/Time:  Saturday May 24 2017 14:49:09 EDT Ventricular Rate:  77 PR Interval:  224 QRS Duration: 78 QT Interval:  382 QTC Calculation: 432 R Axis:   38 Text Interpretation:  Unusual P axis, possible ectopic atrial rhythm  , new since last tracing Abnormal ECG Confirmed by Dorie Rank [phone]) on 05/24/2017 2:54:25 PM       Radiology No results found.  Procedures Procedures (including  critical care time)  Medications Ordered in ED Medications  ziprasidone (GEODON) 20 MG injection (not administered)  sterile water (preservative free) injection (not administered)  LORazepam (ATIVAN) injection 0-4 mg ( Intravenous See Alternative 05/27/17 2152)    Or  LORazepam (ATIVAN) tablet 0-4 mg (4 mg Oral Given 05/27/17 2152)  thiamine (VITAMIN B-1) tablet 100 mg (100 mg Oral Not Given 05/27/17 1007)    Or  thiamine (B-1) injection 100 mg ( Intravenous See Alternative 05/27/17 1007)  ondansetron (ZOFRAN) tablet 4 mg (not administered)  thiamine (VITAMIN B-1) tablet 100 mg (100 mg Oral Not Given 7/34/19 3790)  folic acid (FOLVITE) tablet 1 mg (1 mg Oral Not Given 05/27/17 1002)  Vitamin D (Ergocalciferol) (DRISDOL) capsule 50,000 Units (50,000 Units Oral Not Given 05/26/17 2230)  multivitamin with minerals tablet 1 tablet (1 tablet Oral Given 05/27/17 0958)  calcium citrate (  CALCITRATE - dosed in mg elemental calcium) tablet 950 mg (950 mg Oral Not Given 05/27/17 1009)  OLANZapine zydis (ZYPREXA) disintegrating tablet 10 mg (10 mg Oral Given 05/27/17 2152)    And  LORazepam (ATIVAN) tablet 1 mg (not administered)    And  ziprasidone (GEODON) injection 20 mg (20 mg Intramuscular Given 05/26/17 0858)  ziprasidone (GEODON) 20 MG injection (not administered)  LORazepam (ATIVAN) 2 MG/ML injection (not administered)  traZODone (DESYREL) tablet 100 mg (100 mg Oral Given 05/27/17 2152)  LORazepam (ATIVAN) injection 0-4 mg (0 mg Intramuscular Not Given 05/25/17 2354)    Or  LORazepam (ATIVAN) tablet 0-4 mg ( Oral See Alternative 05/25/17 2354)  Asenapine Maleate SUBL 10 mg (10 mg Sublingual Not Given 05/27/17 1002)  carbamazepine (TEGRETOL) tablet 400 mg (400 mg Oral Not Given 05/27/17 1002)  ziprasidone (GEODON) injection 10 mg (10 mg Intramuscular Given 05/24/17 0108)  OLANZapine zydis (ZYPREXA) disintegrating tablet 10 mg (10 mg Oral Given 05/24/17 0821)  ziprasidone (GEODON) injection 20 mg (20 mg  Intramuscular Given 05/24/17 0817)  LORazepam (ATIVAN) injection 1 mg (1 mg Intramuscular Given 05/24/17 0817)     Initial Impression / Assessment and Plan / ED Course  I have reviewed the triage vital signs and the nursing notes.  Pertinent labs & imaging results that were available during my care of the patient were reviewed by me and considered in my medical decision making (see chart for details).     Pt comes in with cc of aggressive behavior. She is hostile towards the staff, and discusses wanting to burn down the hospital. PT also noted to be talking to herself when I first walked in. We will continue the IVC - call BH.  Final Clinical Impressions(s) / ED Diagnoses   Final diagnoses:  Psychiatric disturbance  Psychosis, unspecified psychosis type    New Prescriptions New Prescriptions   No medications on file   I personally performed the services described in this documentation, which was scribed in my presence. The recorded information has been reviewed and is accurate.     Varney Biles, MD 05/24/17 [place], [place], MD 05/28/17 [phone]

## 2017-05-24 NOTE — Progress Notes (Signed)
Per psychiatrist Akintayo and NP Rankin patient meets inpatient criteria. CSW faxed patient's referral to: [facility], [facility], [place], [place], [place] and [facility], [facility].   Abundio Miu, [place] Emergency Department  Clinical Social Worker [phone]

## 2017-05-24 NOTE — ED Notes (Signed)
Pt cursing at staff coming out of restraints again, MD notified

## 2017-05-24 NOTE — ED Notes (Signed)
Patient coming out of her room and going into other patient's rooms and slamming her door. Patient unable to be redirected-Dr. Kathrynn Humble made aware and patient restrained. Patient attempts to hit staff when trying to redirect patient back to room.

## 2017-05-24 NOTE — ED Notes (Signed)
Patient brought back to Res B from triage with off duty and 3 security. Patient is aggressive-attempting to physically Customer service manager, cursing staff. Patient is uncooperative in changing or getting onto stretcher. Patient physically restrained in gurney cuffs x4 extremities for patient and staff safety. Patient changed into scrubs and lab work obtained. Medicated with Geodon as ordered.

## 2017-05-24 NOTE — ED Notes (Addendum)
Patient still asleep from medications administered in ED.  Blood pressure high, Dr. Lacinda Axon notified.  No orders at this time.

## 2017-05-25 DIAGNOSIS — F319 Bipolar disorder, unspecified: Secondary | ICD-10-CM

## 2017-05-25 DIAGNOSIS — F29 Unspecified psychosis not due to a substance or known physiological condition: Secondary | ICD-10-CM | POA: Diagnosis not present

## 2017-05-25 DIAGNOSIS — F1010 Alcohol abuse, uncomplicated: Secondary | ICD-10-CM

## 2017-05-25 DIAGNOSIS — F209 Schizophrenia, unspecified: Secondary | ICD-10-CM | POA: Diagnosis not present

## 2017-05-25 DIAGNOSIS — R45851 Suicidal ideations: Secondary | ICD-10-CM | POA: Diagnosis not present

## 2017-05-25 DIAGNOSIS — F17200 Nicotine dependence, unspecified, uncomplicated: Secondary | ICD-10-CM | POA: Diagnosis not present

## 2017-05-25 DIAGNOSIS — Z79899 Other long term (current) drug therapy: Secondary | ICD-10-CM

## 2017-05-25 DIAGNOSIS — F200 Paranoid schizophrenia: Secondary | ICD-10-CM | POA: Diagnosis not present

## 2017-05-25 DIAGNOSIS — Z9114 Patient's other noncompliance with medication regimen: Secondary | ICD-10-CM | POA: Diagnosis not present

## 2017-05-25 MED ORDER — ASENAPINE MALEATE 2.5 MG SL SUBL
10.0000 mg | SUBLINGUAL_TABLET | Freq: Two times a day (BID) | SUBLINGUAL | Status: DC
  Administered 2017-05-25: 10 mg via SUBLINGUAL
  Filled 2017-05-25 (×8): qty 4

## 2017-05-25 MED ORDER — CARBAMAZEPINE 200 MG PO TABS
200.0000 mg | ORAL_TABLET | Freq: Two times a day (BID) | ORAL | Status: DC
  Administered 2017-05-26: 200 mg via ORAL
  Filled 2017-05-25 (×3): qty 1

## 2017-05-25 NOTE — ED Provider Notes (Signed)
  Physical Exam  BP (!) 162/96 (BP Location: Right Arm)   Pulse 86   Temp 98.4 F (36.9 C) (Oral)   Resp 20   SpO2 98%   Physical Exam  ED Course  Procedures  MDM Lorraine Tapia is a 66 y.o. female here with agitation. Patient under IVC. Still pending psych placement. Medically stable         Drenda Freeze, MD 05/25/17 [phone]

## 2017-05-25 NOTE — Consult Note (Signed)
[place] Psychiatry Consult   Reason for Consult:  Psychiatric evaluation Referring Physician:  EDP Patient Identification: Lorraine Tapia MRN:  [id number] Principal Diagnosis: Bipolar disorder with psychotic features [facility]) Diagnosis:   Patient Active Problem List   Diagnosis Date Noted  . Bipolar disorder with psychotic features ([place]) [F31.9] 09/27/2014    Priority: High  . Osteopenia determined by x-ray [M85.80] 05/15/2017  . Schizophrenia ([place]) [F20.9] 09/18/2016  . Unintentional weight loss [R63.4] 06/25/2016  . Edentulism [K00.0] 06/25/2016  . Seizures ([place]) [R56.9]   . Vitamin D deficiency [E55.9] 03/27/2015  . RBC microcytosis [R71.8] 03/27/2015  . Heavy smoker [F17.200] 03/24/2015  . ETOH abuse [F10.10] 03/24/2015  . Other malignant neoplasm without specification of site [C80.1] 02/01/2014  . HTN (hypertension) [I10] 02/01/2014  . 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 Adenocarcinoma ([place]) [C80.1] 09/01/2013  . 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 female patient admitted with suicidal thoughts and psychosis  HPI:  Patient report history of Bipolar disorder and alcohol abuse. She was brought to [facility] under IVC due to patient being a danger to herself and not taking her medications. She reports hearing voices and having suicidal thoughts. Patient is extremely volatile, easily agitated, verbally aggressive and threatening to burn down [employer].  Today, she remains, defiant, oppositional, belligerent and argumentative.  Past Psychiatric History: as above  Risk to Self: Suicidal Ideation:  (Per EDP note however pt denies. ) Suicidal Intent:  (UTA) Is patient at risk for suicide?:  (Pt denies.) Suicidal Plan?:  (UTA') Access to Means:  (Pt denies.) What has been your use of drugs/alcohol within the last 12 months?: UDS is  pending.  How many times?:  (NA) Other Self Harm Risks: UTA Triggers for Past Attempts: Other (Comment) (UTA) Intentional Self Injurious Behavior:  (UTA) Risk to Others: Homicidal Ideation: No (Pt denies. ) Thoughts of Harm to Others:  (UTA) Current Homicidal Intent: No Current Homicidal Plan: No Access to Homicidal Means:  (UTA) Identified Victim: UTA History of harm to others?:  (TUA) Assessment of Violence:  (UTA) Violent Behavior Description: UTA Does patient have access to weapons?: No (Pt denies. ) Criminal Charges Pending?:  (UTA) Does patient have a court date:  (UTA) Prior Inpatient Therapy: Prior Inpatient Therapy:  (UTA) Prior Therapy Dates: UTA Prior Therapy Facilty/Provider(s): UTA Reason for Treatment: UTA Prior Outpatient Therapy: Prior Outpatient Therapy:  (UTA) Prior Therapy Dates: UTA Prior Therapy Facilty/Provider(s): UTA Reason for Treatment: UTA Does patient have an ACCT team?: Unknown Does patient have Intensive In-House Services?  : Unknown Does patient have [facility] services? : Unknown Does patient have P4CC services?: Unknown  Past Medical History:  Past Medical History:  Diagnosis Date  . Bipolar disorder ([place])   . Cancer ([place]) 10-19-2013   mucosa cancer  . Depression   . ETOH abuse   . Hx of radiation therapy 02/21/14-04/06/14   right buccal 66y/76f  . Psychiatric disorder   . Shortness of breath    with exertion    Past Surgical History:  Procedure Laterality Date  . MULTIPLE EXTRACTIONS WITH ALVEOLOPLASTY N/A 02/01/2014   Procedure: Extraction of tooth #'s 6,7,8,10,11,12,15,16,22,23,25,26,27 with alveoloplasty.;  Surgeon: RLenn Cal DDS;  Location: [facility] ORS;  Service: Oral Surgery;  Laterality: N/A;  ANESTHESIA: GENERAL WITH NASAL TUBE  . Right buccal mucosa composite resection  10/19/2013   S/P  right buccal mucosa composite resection with flap reconstruction and right modified neck dissection on 10/20/2039 by Dr. Fredricka Bonine at  [facility]   Family History:  Family History  Problem Relation Age of Onset  . Cancer Mother   . Hypertension Mother   . Hypertension Father    Family Psychiatric  History:  Social History:  History  Alcohol Use  . 1.2 oz/week  . 2 Cans of beer per week    Comment:  2 24  oz beer occasionally     History  Drug Use No    Social History   Social History  . Marital status: Single    Spouse name: N/A  . Number of children: N/A  . Years of education: N/A   Occupational History  .      Disability   Social History Main Topics  . Smoking status: Current Some Day Smoker  . Smokeless tobacco: None  . Alcohol use 1.2 oz/week    2 Cans of beer per week     Comment:  2 24  oz beer occasionally  . Drug use: No  . Sexual activity: Yes   Other Topics Concern  . None   Social History Narrative   ** Merged History Encounter **       Additional Social History:    Allergies:   Allergies  Allergen Reactions  . Other Itching    Rash and itching with pepper    Labs:  Results for orders placed or performed during the hospital encounter of 05/24/17 (from the past 48 hour(s))  Comprehensive metabolic panel     Status: Abnormal   Collection Time: 05/24/17  1:10 AM  Result Value Ref Range   Sodium 140 135 - 145 mmol/L   Potassium 3.4 (L) 3.5 - 5.1 mmol/L   Chloride 105 101 - 111 mmol/L   CO2 25 22 - 32 mmol/L   Glucose, Bld 103 (H) 65 - 99 mg/dL   BUN 14 6 - 20 mg/dL   Creatinine, Ser 0.77 0.44 - 1.00 mg/dL   Calcium 9.8 8.9 - 10.3 mg/dL   Total Protein 8.5 (H) 6.5 - 8.1 g/dL   Albumin 4.8 3.5 - 5.0 g/dL   AST 20 15 - 41 U/L   ALT 14 14 - 54 U/L   Alkaline Phosphatase 63 38 - 126 U/L   Total Bilirubin 1.1 0.3 - 1.2 mg/dL   GFR calc non Af Amer >60 >60 mL/min   GFR calc Af Amer >60 >60 mL/min    Comment: (NOTE) The eGFR has been calculated using the CKD EPI equation. This calculation has not been validated in all clinical situations. eGFR's persistently <60  mL/min signify possible Chronic Kidney Disease.    Anion gap 10 5 - 15  Ethanol     Status: None   Collection Time: 05/24/17  1:10 AM  Result Value Ref Range   Alcohol, Ethyl (B) <5 <5 mg/dL    Comment:        LOWEST DETECTABLE LIMIT FOR SERUM ALCOHOL IS 5 mg/dL FOR MEDICAL PURPOSES ONLY   Salicylate level     Status: None   Collection Time: 05/24/17  1:10 AM  Result Value Ref Range   Salicylate Lvl <0.9 2.8 - 30.0 mg/dL  Acetaminophen level     Status: Abnormal   Collection Time: 05/24/17  1:10 AM  Result Value Ref Range   Acetaminophen (Tylenol), Serum <10 (L) 10 - 30 ug/mL    Comment:  THERAPEUTIC CONCENTRATIONS VARY SIGNIFICANTLY. A RANGE OF 10-30 ug/mL MAY BE AN EFFECTIVE CONCENTRATION FOR MANY PATIENTS. HOWEVER, SOME ARE BEST TREATED AT CONCENTRATIONS OUTSIDE THIS RANGE. ACETAMINOPHEN CONCENTRATIONS >150 ug/mL AT 4 HOURS AFTER INGESTION AND >50 ug/mL AT 12 HOURS AFTER INGESTION ARE OFTEN ASSOCIATED WITH TOXIC REACTIONS.   cbc     Status: Abnormal   Collection Time: 05/24/17  1:10 AM  Result Value Ref Range   WBC 7.3 4.0 - 10.5 K/uL   RBC 3.53 (L) 3.87 - 5.11 MIL/uL   Hemoglobin 12.2 12.0 - 15.0 g/dL   HCT 99.7 (L) 16.7 - 49.3 %   MCV 97.2 78.0 - 100.0 fL   MCH 34.6 (H) 26.0 - 34.0 pg   MCHC 35.6 30.0 - 36.0 g/dL   RDW 28.1 27.2 - 75.4 %   Platelets 275 150 - 400 K/uL  Rapid urine drug screen (hospital performed)     Status: None   Collection Time: 05/24/17  3:37 PM  Result Value Ref Range   Opiates NONE DETECTED NONE DETECTED   Cocaine NONE DETECTED NONE DETECTED   Benzodiazepines NONE DETECTED NONE DETECTED   Amphetamines NONE DETECTED NONE DETECTED   Tetrahydrocannabinol NONE DETECTED NONE DETECTED   Barbiturates NONE DETECTED NONE DETECTED    Comment:        DRUG SCREEN FOR MEDICAL PURPOSES ONLY.  IF CONFIRMATION IS NEEDED FOR ANY PURPOSE, NOTIFY LAB WITHIN 5 DAYS.        LOWEST DETECTABLE LIMITS FOR URINE DRUG SCREEN Drug Class        Cutoff (ng/mL) Amphetamine      1000 Barbiturate      200 Benzodiazepine   200 Tricyclics       300 Opiates          300 Cocaine          300 THC              50   Urinalysis, Routine w reflex microscopic     Status: None   Collection Time: 05/24/17  3:37 PM  Result Value Ref Range   Color, Urine YELLOW YELLOW   APPearance CLEAR CLEAR   Specific Gravity, Urine 1.009 1.005 - 1.030   pH 7.0 5.0 - 8.0   Glucose, UA NEGATIVE NEGATIVE mg/dL   Hgb urine dipstick NEGATIVE NEGATIVE   Bilirubin Urine NEGATIVE NEGATIVE   Ketones, ur NEGATIVE NEGATIVE mg/dL   Protein, ur NEGATIVE NEGATIVE mg/dL   Nitrite NEGATIVE NEGATIVE   Leukocytes, UA NEGATIVE NEGATIVE    Current Facility-Administered Medications  Medication Dose Route Frequency Provider Last Rate Last Dose  . asenapine (SAPHRIS) sublingual tablet 10 mg  10 mg Sublingual BID Thedore Mins, MD   10 mg at 05/24/17 2208  . calcium citrate (CALCITRATE - dosed in mg elemental calcium) tablet 950 mg  950 mg Oral Daily Derwood Kaplan, MD   950 mg at 05/25/17 0907  . carbamazepine (TEGRETOL) tablet 200 mg  200 mg Oral BID PC Keyonta Barradas, MD      . folic acid (FOLVITE) tablet 1 mg  1 mg Oral Daily Rhunette Croft, Ankit, MD   1 mg at 05/25/17 0906  . [START ON 05/26/2017] LORazepam (ATIVAN) injection 0-4 mg  0-4 mg Intravenous Q12H Derwood Kaplan, MD       Or  . Melene Muller ON 05/26/2017] LORazepam (ATIVAN) tablet 0-4 mg  0-4 mg Oral Q12H Nanavati, Ankit, MD      . LORazepam (ATIVAN) injection 0-4 mg  0-4 mg Intramuscular  Q6H Varney Biles, MD   2 mg at 05/25/17 0912   Or  . LORazepam (ATIVAN) tablet 0-4 mg  0-4 mg Oral Q6H Nanavati, Ankit, MD   2 mg at 05/25/17 0909  . OLANZapine zydis (ZYPREXA) disintegrating tablet 10 mg  10 mg Oral Q8H PRN Nat Christen, MD   10 mg at 05/24/17 1844   And  . LORazepam (ATIVAN) tablet 1 mg  1 mg Oral PRN Nat Christen, MD       And  . ziprasidone (GEODON) injection 20 mg  20 mg Intramuscular PRN Nat Christen, MD       . multivitamin with minerals tablet 1 tablet  1 tablet Oral Daily Varney Biles, MD   1 tablet at 05/25/17 0906  . ondansetron (ZOFRAN) tablet 4 mg  4 mg Oral Q8H PRN Nanavati, Ankit, MD      . thiamine (VITAMIN B-1) tablet 100 mg  100 mg Oral Daily Nanavati, Ankit, MD       Or  . thiamine (B-1) injection 100 mg  100 mg Intravenous Daily Nanavati, Ankit, MD      . thiamine (VITAMIN B-1) tablet 100 mg  100 mg Oral Daily Kathrynn Humble, Ankit, MD   100 mg at 05/25/17 0906  . traZODone (DESYREL) tablet 100 mg  100 mg Oral QHS PRN Corena Pilgrim, MD      . Vitamin D (Ergocalciferol) (DRISDOL) capsule 50,000 Units  50,000 Units Oral Q30 days Varney Biles, MD       Current Outpatient Prescriptions  Medication Sig Dispense Refill  . Calcium Citrate 200 MG TABS Take 2 tablets (400 mg total) by mouth daily. (Patient not taking: Reported on 05/25/2017) 756 tablet 3  . folic acid (FOLVITE) 1 MG tablet Take 1 tablet (1 mg total) by mouth daily. (Patient not taking: Reported on 09/17/2016) 30 tablet 5  . Multiple Vitamins-Minerals (MULTIVITAMIN) tablet Take 1 tablet by mouth daily. (Patient not taking: Reported on 09/17/2016) 30 tablet 5  . thiamine (VITAMIN B-1) 100 MG tablet Take 1 tablet (100 mg total) by mouth daily. (Patient not taking: Reported on 09/17/2016) 30 tablet 5  . Vitamin D, Ergocalciferol, (DRISDOL) 50000 units CAPS capsule Take 1 capsule (50,000 Units total) by mouth every 30 (thirty) days. (Patient not taking: Reported on 05/25/2017) 4 capsule 3    Musculoskeletal: Strength & Muscle Tone: within normal limits Gait & Station: normal Patient leans: N/A  Psychiatric Specialty Exam: Physical Exam  Psychiatric: Her affect is angry and labile. Her speech is rapid and/or pressured and tangential. She is agitated, aggressive, actively hallucinating and combative. Thought content is paranoid and delusional. Cognition and memory are normal. She expresses impulsivity. She expresses suicidal  ideation.    Review of Systems  Constitutional: Negative.   HENT: Negative.   Eyes: Negative.   Respiratory: Negative.   Cardiovascular: Negative.   Gastrointestinal: Negative.   Genitourinary: Negative.   Musculoskeletal: Negative.   Skin: Negative.   Psychiatric/Behavioral: Positive for hallucinations and suicidal ideas. The patient is nervous/anxious and has insomnia.     Blood pressure (!) 162/96, pulse 86, temperature 98.4 F (36.9 C), temperature source Oral, resp. rate 20, SpO2 98 %.There is no height or weight on file to calculate BMI.  General Appearance: Disheveled  Eye Contact:  Good  Speech:  Pressured  Volume:  Increased  Mood:  Angry and Irritable  Affect:  Labile  Thought Process:  Disorganized  Orientation:  Full (Time, Place, and Person)  Thought Content:  Delusions and Hallucinations:  Auditory  Suicidal Thoughts:  Yes.  without intent/plan  Homicidal Thoughts:  No  Memory:  Immediate;   Fair Recent;   Fair Remote;   Fair  Judgement:  Impaired  Insight:  Shallow  Psychomotor Activity:  Increased  Concentration:  Concentration: Fair and Attention Span: Fair  Recall:  [employer] of Knowledge:  Fair  Language:  Good  Akathisia:  No  Handed:  Right  AIMS (if indicated):     Assets:  Communication Skills  ADL's:  Intact  Cognition:  WNL  Sleep:   poor     Treatment Plan Summary: Daily contact with patient to assess and evaluate symptoms and progress in treatment and Medication management Continue Saphris 10 mg S/L BID for psychosis/agitation. Add Tegretol '200mg'$  bid for mood stabilization.  Disposition: Recommend psychiatric Inpatient admission when medically cleared.  Corena Pilgrim, MD 05/25/2017 12:09 PM

## 2017-05-25 NOTE — ED Notes (Signed)
Patient continues to be calm at times but then will become upset easily and start cursing and threatening staff.

## 2017-05-26 DIAGNOSIS — F29 Unspecified psychosis not due to a substance or known physiological condition: Secondary | ICD-10-CM | POA: Diagnosis not present

## 2017-05-26 MED ORDER — CARBAMAZEPINE 200 MG PO TABS
400.0000 mg | ORAL_TABLET | Freq: Two times a day (BID) | ORAL | Status: DC
  Administered 2017-05-26: 400 mg via ORAL
  Filled 2017-05-26 (×3): qty 2

## 2017-05-26 NOTE — ED Notes (Signed)
Pt requesting soap, and lotion stating wants to take a shower. Once in the bathroom, pt sts "I don't take showers" and only wants a washcloth to wash up at the sink. She is verbally aggressive, demeaning to staff and very hostile.

## 2017-05-26 NOTE — ED Notes (Signed)
Patient at the desk stating that she only needs 1 medication. Reports that she only takes Haldol 1 time a month. States that she will only take that medication nothing else.

## 2017-05-26 NOTE — Progress Notes (Addendum)
CSW received call from [place] legal guardian at [phone] requesting to be contacted with any updates. Patients ACT team is [facility]- Collie Siad [phone].  11:15AM: CSW received phone call from Marikay Alar with DSS requesting to speak with nurse for updates regarding patient. Ms. Anderson informed nurse she was not legal guardian but patients payee. CSW confirmed with Ms. Anderson patient is own legal guardian. Please disregard previous note.    Kingsley Spittle, LCSWA Clinical Social Worker [phone]

## 2017-05-26 NOTE — BH Assessment (Signed)
[place] Assessment Progress Note This Probation officer spoke with patient this date to re-assess and evaluate treatment progress. Patient is very agitated and refuses to interact with this Probation officer, as this Probation officer attempted to leave the room the patient started demanding to leave the facility. This Probation officer attempted to gather information from patient and discuss her case/progress but patient would not allow this Probation officer to speak and kept interrupting becoming verbally abuse towards this Probation officer. Patient was unable to be re-assessed and would not answer any of this writer's questions. Patient was difficult to understand and could not be re-directed. Patient was yelling obscenities at this Probation officer as Probation officer was leaving the room. Collateral obtained from staff nurses and chart review note that patient continues to be aggressive/demanding and is refusing some of her medications. Case was staffed with Akintayo MD who recommended continued inpatient monitoring as appropriate bed placement is investigated.

## 2017-05-26 NOTE — BH Assessment (Signed)
[place] Assessment Progress Note  Per Corena Pilgrim, MD, this pt requires psychiatric hospitalization at this time.  Pt presents under IVC initiated by her facility house manager, and upheld by EDP Varney Biles, MD.  The following facilities have been contacted to seek placement for this pt, with results as noted:  Beds available, information sent, decision pending:  [facility]   Declined:  [facility] (due to pt acuity)   At capacity:  [facility] [place] Triage Specialist [phone]

## 2017-05-27 DIAGNOSIS — F29 Unspecified psychosis not due to a substance or known physiological condition: Secondary | ICD-10-CM | POA: Diagnosis not present

## 2017-05-27 DIAGNOSIS — Z9114 Patient's other noncompliance with medication regimen: Secondary | ICD-10-CM

## 2017-05-27 DIAGNOSIS — F200 Paranoid schizophrenia: Secondary | ICD-10-CM

## 2017-05-27 NOTE — Consult Note (Signed)
[place] Psychiatry Consult   Reason for Consult:  Psychosis Referring Physician:  EDP Patient Identification: Lorraine Tapia MRN:  [id number] Principal Diagnosis: Bipolar disorder with psychotic features [facility]) Diagnosis:   Patient Active Problem List   Diagnosis Date Noted  . Osteopenia determined by x-ray [M85.80] 05/15/2017  . Schizophrenia ([place]) [F20.9] 09/18/2016  . Unintentional weight loss [R63.4] 06/25/2016  . Edentulism [K00.0] 06/25/2016  . Seizures ([place]) [R56.9]   . Vitamin D deficiency [E55.9] 03/27/2015  . RBC microcytosis [R71.8] 03/27/2015  . Heavy smoker [F17.200] 03/24/2015  . ETOH abuse [F10.10] 03/24/2015  . Bipolar disorder with psychotic features ([place]) [F31.9] 09/27/2014  . Other malignant neoplasm without specification of site [C80.1] 02/01/2014  . HTN (hypertension) [I10] 02/01/2014  . 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 Adenocarcinoma ([place]) [C80.1] 09/01/2013  . 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 female patient admitted with acute psychosis.  HPI:  Lorraine Tapia is a 66 year old female who was admitted to the [facility] with acute psychosis, under IVC. Pt had not been taking her medications prior to admission and became psychotic. Today pt is calm but verbal about her beliefs. Pt continues to be psychotic and will raise her voice but she is not aggressive or attempting to hurt herself or anyone else. Pt stated she has her own Dr and doesn't need any doctor here in the hospital. Pt stated "I am my own doctor and I take my Haldol once a month." Pt would benefit from an inpatient psychiatric admission for crisis stabilization and medication management. TTS will continue to seek placement.   Past Psychiatric History: Schizophrenia, Bipolar disrorder  Risk to Self: Suicidal Ideation:  (Per EDP note however pt  denies. ) Suicidal Intent:  (UTA) Is patient at risk for suicide?:  (Pt denies.) Suicidal Plan?:  (UTA') Access to Means:  (Pt denies.) What has been your use of drugs/alcohol within the last 12 months?: UDS is pending.  How many times?:  (NA) Other Self Harm Risks: UTA Triggers for Past Attempts: Other (Comment) (UTA) Intentional Self Injurious Behavior:  (UTA) Risk to Others: Homicidal Ideation: No (Pt denies. ) Thoughts of Harm to Others:  (UTA) Current Homicidal Intent: No Current Homicidal Plan: No Access to Homicidal Means:  (UTA) Identified Victim: UTA History of harm to others?:  (TUA) Assessment of Violence:  (UTA) Violent Behavior Description: UTA Does patient have access to weapons?: No (Pt denies. ) Criminal Charges Pending?:  (UTA) Does patient have a court date:  (UTA) Prior Inpatient Therapy: Prior Inpatient Therapy:  (UTA) Prior Therapy Dates: UTA Prior Therapy Facilty/Provider(s): UTA Reason for Treatment: UTA Prior Outpatient Therapy: Prior Outpatient Therapy:  (UTA) Prior Therapy Dates: UTA Prior Therapy Facilty/Provider(s): UTA Reason for Treatment: UTA Does patient have an ACCT team?: Unknown Does patient have Intensive In-House Services?  : Unknown Does patient have [facility] services? : Unknown Does patient have P4CC services?: Unknown  Past Medical History:  Past Medical History:  Diagnosis Date  . Bipolar disorder ([place])   . Cancer ([place]) 10-19-2013   mucosa cancer  . Depression   . ETOH abuse   . Hx of radiation therapy 02/21/14-04/06/14   right buccal 66y/64fx  . Psychiatric disorder   . Shortness of breath    with exertion    Past Surgical History:  Procedure Laterality Date  . MULTIPLE EXTRACTIONS WITH  ALVEOLOPLASTY N/A 02/01/2014   Procedure: Extraction of tooth #'s 6,7,8,10,11,12,15,16,22,23,25,26,27 with alveoloplasty.;  Surgeon: Lenn Cal, DDS;  Location: [facility];  Service: Oral Surgery;  Laterality: N/A;  ANESTHESIA: GENERAL WITH  NASAL TUBE  . Right buccal mucosa composite resection  10/19/2013   S/P right buccal mucosa composite resection with flap reconstruction and right modified neck dissection on 10/20/2039 by Dr. Fredricka Bonine at [facility]   Family History:  Family History  Problem Relation Age of Onset  . Cancer Mother   . Hypertension Mother   . Hypertension Father    Family Psychiatric  History: Unknown Social History:  History  Alcohol Use  . 1.2 oz/week  . 2 Cans of beer per week    Comment:  2 24  oz beer occasionally     History  Drug Use No    Social History   Social History  . Marital status: Single    Spouse name: N/A  . Number of children: N/A  . Years of education: N/A   Occupational History  .      Disability   Social History Main Topics  . Smoking status: Current Some Day Smoker  . Smokeless tobacco: None  . Alcohol use 1.2 oz/week    2 Cans of beer per week     Comment:  2 24  oz beer occasionally  . Drug use: No  . Sexual activity: Yes   Other Topics Concern  . None   Social History Narrative   ** Merged History Encounter **       Additional Social History:    Allergies:   Allergies  Allergen Reactions  . Other Itching    Rash and itching with pepper    Labs: No results found for this or any previous visit (from the past 48 hour(s)).  Current Facility-Administered Medications  Medication Dose Route Frequency Provider Last Rate Last Dose  . Asenapine Maleate SUBL 10 mg  10 mg Sublingual BID Minda Ditto, RPH   10 mg at 05/25/17 1351  . calcium citrate (CALCITRATE - dosed in mg elemental calcium) tablet 950 mg  950 mg Oral Daily Varney Biles, MD   950 mg at 05/26/17 0908  . carbamazepine (TEGRETOL) tablet 400 mg  400 mg Oral BID PC Lynnlee Revels, MD   400 mg at 05/26/17 1718  . folic acid (FOLVITE) tablet 1 mg  1 mg Oral Daily Nanavati, Ankit, MD   1 mg at 05/26/17 0910  . LORazepam (ATIVAN) injection 0-4 mg  0-4 mg Intravenous Q12H  Varney Biles, MD       Or  . LORazepam (ATIVAN) tablet 0-4 mg  0-4 mg Oral Q12H Nanavati, Ankit, MD   1 mg at 05/26/17 1313  . OLANZapine zydis (ZYPREXA) disintegrating tablet 10 mg  10 mg Oral Q8H PRN Nat Christen, MD   10 mg at 05/26/17 2023   And  . LORazepam (ATIVAN) tablet 1 mg  1 mg Oral PRN Nat Christen, MD      . multivitamin with minerals tablet 1 tablet  1 tablet Oral Daily Varney Biles, MD   1 tablet at 05/27/17 0958  . ondansetron (ZOFRAN) tablet 4 mg  4 mg Oral Q8H PRN Nanavati, Ankit, MD      . thiamine (VITAMIN B-1) tablet 100 mg  100 mg Oral Daily Nanavati, Ankit, MD   100 mg at 05/25/17 1241   Or  . thiamine (B-1) injection 100 mg  100 mg Intravenous Daily Nanavati,  Ankit, MD      . thiamine (VITAMIN B-1) tablet 100 mg  100 mg Oral Daily Kathrynn Humble, Ankit, MD   100 mg at 05/26/17 0908  . traZODone (DESYREL) tablet 100 mg  100 mg Oral QHS PRN Corena Pilgrim, MD   100 mg at 05/26/17 2023  . Vitamin D (Ergocalciferol) (DRISDOL) capsule 50,000 Units  50,000 Units Oral Q30 days Varney Biles, MD       Current Outpatient Prescriptions  Medication Sig Dispense Refill  . Calcium Citrate 200 MG TABS Take 2 tablets (400 mg total) by mouth daily. (Patient not taking: Reported on 05/25/2017) 035 tablet 3  . folic acid (FOLVITE) 1 MG tablet Take 1 tablet (1 mg total) by mouth daily. (Patient not taking: Reported on 09/17/2016) 30 tablet 5  . Multiple Vitamins-Minerals (MULTIVITAMIN) tablet Take 1 tablet by mouth daily. (Patient not taking: Reported on 09/17/2016) 30 tablet 5  . thiamine (VITAMIN B-1) 100 MG tablet Take 1 tablet (100 mg total) by mouth daily. (Patient not taking: Reported on 09/17/2016) 30 tablet 5  . Vitamin D, Ergocalciferol, (DRISDOL) 50000 units CAPS capsule Take 1 capsule (50,000 Units total) by mouth every 30 (thirty) days. (Patient not taking: Reported on 05/25/2017) 4 capsule 3    Musculoskeletal: Strength & Muscle Tone: within normal limits Gait & Station:  normal Patient leans: N/A  Psychiatric Specialty Exam: Physical Exam  Constitutional: She appears well-developed and well-nourished.  Neck: Normal range of motion.  Respiratory: Effort normal.  Musculoskeletal: Normal range of motion.  Neurological: She is alert.    Review of Systems  Psychiatric/Behavioral: Positive for depression and hallucinations (psychosis). Negative for memory loss, substance abuse and suicidal ideas. The patient is not nervous/anxious and does not have insomnia.     Blood pressure 123/69, pulse 72, temperature 98.1 F (36.7 C), temperature source Oral, resp. rate 16, SpO2 100 %.There is no height or weight on file to calculate BMI.  General Appearance: Casual  Eye Contact:  Good  Speech:  Clear and Coherent and Pressured  Volume:  Increased  Mood:  Irritable  Affect:  Congruent and Labile  Thought Process:  Disorganized  Orientation:  Full (Time, Place, and Person)  Thought Content:  Illogical  Suicidal Thoughts:  No  Homicidal Thoughts:  No  Memory:  Immediate;   Fair Recent;   Fair Remote;   Fair  Judgement:  Poor  Insight:  Lacking  Psychomotor Activity:  Increased  Concentration:  Concentration: Fair and Attention Span: Fair  Recall:  [employer] of Knowledge:  Fair  Language:  Good  Akathisia:  No  Handed:  Right  AIMS (if indicated):     Assets:  Financial Resources/Insurance Housing Social Support  ADL's:  Intact  Cognition:  WNL  Sleep:        Treatment Plan Summary: Daily contact with patient to assess and evaluate symptoms and progress in treatment, Medication management and Plan Paranoid Schizophrenia with psychosis  Crisis Stabilization -Continue current prescribed medications (See MAR)  Disposition: Recommend psychiatric Inpatient admission when medically cleared.  Ethelene Hal, NP 05/27/2017 3:23 PM  Patient seen face-to-face for psychiatric evaluation, chart reviewed and case discussed with the physician extender  and developed treatment plan. Reviewed the information documented and agree with the treatment plan. Corena Pilgrim, MD

## 2017-05-27 NOTE — Progress Notes (Signed)
05/27/17  1027  Patient has been talkative, cooperative, and easily redirected. Patient has not been combative.

## 2017-05-27 NOTE — Progress Notes (Signed)
05/27/17  1109 Spoke with Ms Lorraine Tapia at dept of social services. Per Ms Lorraine Tapia they are starting the process to obtain guardianship of Ms Lorraine Tapia. Updated Ms Lorraine Tapia with dx history and details on the plans for discharge to a facility.

## 2017-05-27 NOTE — BH Assessment (Signed)
[place] Assessment Progress Note  Per Corena Pilgrim, MD, this pt continues to require psychiatric hospitalization at this time.  The following facilities have been contacted to seek placement for this pt, with results as noted:  Beds available, information sent, decision pending:  [place]:  Savanna Kline, [place] Triage Specialist [phone]

## 2017-05-28 DIAGNOSIS — F209 Schizophrenia, unspecified: Secondary | ICD-10-CM

## 2017-05-28 MED ORDER — POTASSIUM CHLORIDE CRYS ER 20 MEQ PO TBCR
40.0000 meq | EXTENDED_RELEASE_TABLET | Freq: Once | ORAL | Status: DC
  Filled 2017-05-28: qty 2

## 2017-05-28 NOTE — Progress Notes (Signed)
CSW spoke with [facility] who confirmed patient is own legal guardian. Patient was living with friends but unsure if patient will be able to return to residence due to recent behaviors. CSW will provide patient with shelter resources and bus pass.   Kingsley Spittle, LCSWA Clinical Social Worker [phone]

## 2017-05-28 NOTE — Consult Note (Signed)
[place] Psychiatry Consult   Reason for Consult:  Psychosis Referring Physician:  EDP Patient Identification: Lorraine Tapia MRN:  [id number] Principal Diagnosis: Bipolar disorder with psychotic features [facility]) Diagnosis:   Patient Active Problem List   Diagnosis Date Noted  . Osteopenia determined by x-ray [M85.80] 05/15/2017  . Schizophrenia ([place]) [F20.9] 09/18/2016  . Unintentional weight loss [R63.4] 06/25/2016  . Edentulism [K00.0] 06/25/2016  . Seizures ([place]) [R56.9]   . Vitamin D deficiency [E55.9] 03/27/2015  . RBC microcytosis [R71.8] 03/27/2015  . Heavy smoker [F17.200] 03/24/2015  . ETOH abuse [F10.10] 03/24/2015  . Bipolar disorder with psychotic features ([place]) [F31.9] 09/27/2014  . Other malignant neoplasm without specification of site [C80.1] 02/01/2014  . HTN (hypertension) [I10] 02/01/2014  . 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 Adenocarcinoma ([place]) [C80.1] 09/01/2013  . 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 female patient admitted with acute psychosis.  HPI:  Lorraine Tapia is a 67 year old female who was admitted to [facility] with acute psychosis and aggressive behavior. Pt had not been taking her medications prior to admission and was determined to be a danger to herself and others. Today Pt is calm and cooperative, alert & oriented, dressed in paper scrubs and lying on the bed. Pt was asleep when this Probation officer entered the room. Pt stated she was ready to go home and wants to "get out of here." Pt has been taking her medications since admission. Pt is stable and psychiatrically cleared for discharge.   Past Psychiatric History: Schizophrenia, ETOH abuse  Risk to Self: None Risk to Others: None Prior Inpatient Therapy: Prior Inpatient Therapy:  (UTA) Prior Therapy Dates: UTA Prior Therapy Facilty/Provider(s):  UTA Reason for Treatment: UTA Prior Outpatient Therapy: Prior Outpatient Therapy:  (UTA) Prior Therapy Dates: UTA Prior Therapy Facilty/Provider(s): UTA Reason for Treatment: UTA Does patient have an ACCT team?: Unknown Does patient have Intensive In-House Services?  : Unknown Does patient have [facility] services? : Unknown Does patient have P4CC services?: Unknown  Past Medical History:  Past Medical History:  Diagnosis Date  . Bipolar disorder ([place])   . Cancer ([place]) 10-19-2013   mucosa cancer  . Depression   . ETOH abuse   . Hx of radiation therapy 02/21/14-04/06/14   right buccal 66y/30fx  . Psychiatric disorder   . Shortness of breath    with exertion    Past Surgical History:  Procedure Laterality Date  . MULTIPLE EXTRACTIONS WITH ALVEOLOPLASTY N/A 02/01/2014   Procedure: Extraction of tooth #'s 6,7,8,10,11,12,15,16,22,23,25,26,27 with alveoloplasty.;  Surgeon: Lenn Cal, DDS;  Location: [facility];  Service: Oral Surgery;  Laterality: N/A;  ANESTHESIA: GENERAL WITH NASAL TUBE  . Right buccal mucosa composite resection  10/19/2013   S/P right buccal mucosa composite resection with flap reconstruction and right modified neck dissection on 10/20/2039 by Dr. Fredricka Bonine at [facility]   Family History:  Family History  Problem Relation Age of Onset  . Cancer Mother   . Hypertension Mother   . Hypertension Father    Family Psychiatric  History: Unknown Social History:  History  Alcohol Use  . 1.2 oz/week  . 2 Cans of beer per week    Comment:  2 24  oz beer occasionally     History  Drug Use No    Social History   Social History  . Marital  status: Single    Spouse name: N/A  . Number of children: N/A  . Years of education: N/A   Occupational History  .      Disability   Social History Main Topics  . Smoking status: Current Some Day Smoker  . Smokeless tobacco: None  . Alcohol use 1.2 oz/week    2 Cans of beer per week     Comment:  2 24   oz beer occasionally  . Drug use: No  . Sexual activity: Yes   Other Topics Concern  . None   Social History Narrative   ** Merged History Encounter **       Additional Social History:    Allergies:   Allergies  Allergen Reactions  . Other Itching    Rash and itching with pepper    Labs: No results found for this or any previous visit (from the past 48 hour(s)).  Current Facility-Administered Medications  Medication Dose Route Frequency Provider Last Rate Last Dose  . Asenapine Maleate SUBL 10 mg  10 mg Sublingual BID Minda Ditto, RPH   10 mg at 05/25/17 1351  . calcium citrate (CALCITRATE - dosed in mg elemental calcium) tablet 950 mg  950 mg Oral Daily Varney Biles, MD   950 mg at 05/26/17 0908  . carbamazepine (TEGRETOL) tablet 400 mg  400 mg Oral BID PC Felisha Claytor, MD   400 mg at 05/26/17 1718  . folic acid (FOLVITE) tablet 1 mg  1 mg Oral Daily Nanavati, Ankit, MD   1 mg at 05/26/17 0910  . OLANZapine zydis (ZYPREXA) disintegrating tablet 10 mg  10 mg Oral Q8H PRN Nat Christen, MD   10 mg at 05/27/17 2152   And  . LORazepam (ATIVAN) tablet 1 mg  1 mg Oral PRN Nat Christen, MD      . multivitamin with minerals tablet 1 tablet  1 tablet Oral Daily Varney Biles, MD   1 tablet at 05/27/17 0958  . ondansetron (ZOFRAN) tablet 4 mg  4 mg Oral Q8H PRN Nanavati, Ankit, MD      . potassium chloride SA (K-DUR,KLOR-CON) CR tablet 40 mEq  40 mEq Oral Once Orlie Dakin, MD      . thiamine (VITAMIN B-1) tablet 100 mg  100 mg Oral Daily Nanavati, Ankit, MD   100 mg at 05/25/17 1241   Or  . thiamine (B-1) injection 100 mg  100 mg Intravenous Daily Nanavati, Ankit, MD      . thiamine (VITAMIN B-1) tablet 100 mg  100 mg Oral Daily Kathrynn Humble, Ankit, MD   100 mg at 05/26/17 0908  . traZODone (DESYREL) tablet 100 mg  100 mg Oral QHS PRN Corena Pilgrim, MD   100 mg at 05/27/17 2152  . Vitamin D (Ergocalciferol) (DRISDOL) capsule 50,000 Units  50,000 Units Oral Q30 days  Varney Biles, MD       Current Outpatient Prescriptions  Medication Sig Dispense Refill  . Calcium Citrate 200 MG TABS Take 2 tablets (400 mg total) by mouth daily. (Patient not taking: Reported on 05/25/2017) 834 tablet 3  . folic acid (FOLVITE) 1 MG tablet Take 1 tablet (1 mg total) by mouth daily. (Patient not taking: Reported on 09/17/2016) 30 tablet 5  . Multiple Vitamins-Minerals (MULTIVITAMIN) tablet Take 1 tablet by mouth daily. (Patient not taking: Reported on 09/17/2016) 30 tablet 5  . thiamine (VITAMIN B-1) 100 MG tablet Take 1 tablet (100 mg total) by mouth daily. (Patient not taking: Reported on  09/17/2016) 30 tablet 5  . Vitamin D, Ergocalciferol, (DRISDOL) 50000 units CAPS capsule Take 1 capsule (50,000 Units total) by mouth every 30 (thirty) days. (Patient not taking: Reported on 05/25/2017) 4 capsule 3    Musculoskeletal: Strength & Muscle Tone: within normal limits Gait & Station: normal Patient leans: N/A  Psychiatric Specialty Exam: Physical Exam  Constitutional: She appears well-developed and well-nourished.  HENT:  Head: Normocephalic.  Neck: Normal range of motion.  Cardiovascular: Normal rate.   Respiratory: Effort normal.  Musculoskeletal: Normal range of motion.  Neurological: She is alert.    Review of Systems  Psychiatric/Behavioral: Positive for depression and hallucinations. Negative for memory loss, substance abuse and suicidal ideas. The patient is not nervous/anxious and does not have insomnia.     Blood pressure (!) 107/54, pulse (!) 59, temperature 98.6 F (37 C), temperature source Oral, resp. rate 18, SpO2 100 %.There is no height or weight on file to calculate BMI.  General Appearance: Casual  Eye Contact:  Good  Speech:  Garbled and Slow  Volume:  Normal  Mood:  Depressed  Affect:  Congruent and Depressed  Thought Process:  Coherent  Orientation:  Full (Time, Place, and Person)  Thought Content:  Logical  Suicidal Thoughts:  No   Homicidal Thoughts:  No  Memory:  Immediate;   Good Recent;   Fair Remote;   Fair  Judgement:  Poor  Insight:  Lacking  Psychomotor Activity:  Normal  Concentration:  Concentration: Good and Attention Span: Good  Recall:  [employer] of Knowledge:  Fair  Language:  Fair  Akathisia:  No  Handed:  Right  AIMS (if indicated):     Assets:  Financial Resources/Insurance Housing Social Support  ADL's:  Intact  Cognition:  Impaired,  Moderate  Sleep:        Treatment Plan Summary: Plan Discharge Home  Follow up with outpatient resources as provided to you Follow up with PCP for any new or existing medical concerns Take all medications as prescribed   Disposition: No evidence of imminent risk to self or others at present.   Patient does not meet criteria for psychiatric inpatient admission.  Ethelene Hal, NP 05/28/2017 11:49 AM  Patient seen face-to-face for psychiatric evaluation, chart reviewed and case discussed with the physician extender and developed treatment plan. Reviewed the information documented and agree with the treatment plan. Corena Pilgrim, MD

## 2017-05-28 NOTE — Discharge Instructions (Signed)
For your ongoing behavioral health needs, you are advised to continue treatment with the [facility] ACT Team:       [facility]      [address], [address], [place] [address]      [phone]

## 2017-05-28 NOTE — ED Provider Notes (Signed)
Lying in bed, appears comfortable. Appears in no distress . Denies complaint   Orlie Dakin, MD 05/28/17 1028

## 2017-05-28 NOTE — ED Notes (Signed)
Patient discharged but is waiting for a ride.  Ride called and left a message.

## 2017-05-28 NOTE — [facility] Suicide Risk Assessment (Signed)
Suicide Risk Assessment  Discharge Assessment   [facility] Discharge Suicide Risk Assessment   Principal Problem: Bipolar disorder with psychotic features [facility]) Discharge Diagnoses:  Patient Active Problem List   Diagnosis Date Noted  . Osteopenia determined by x-ray [M85.80] 05/15/2017  . Schizophrenia ([place]) [F20.9] 09/18/2016  . Unintentional weight loss [R63.4] 06/25/2016  . Edentulism [K00.0] 06/25/2016  . Seizures ([place]) [R56.9]   . Vitamin D deficiency [E55.9] 03/27/2015  . RBC microcytosis [R71.8] 03/27/2015  . Heavy smoker [F17.200] 03/24/2015  . ETOH abuse [F10.10] 03/24/2015  . Bipolar disorder with psychotic features ([place]) [F31.9] 09/27/2014  . Other malignant neoplasm without specification of site [C80.1] 02/01/2014  . HTN (hypertension) [I10] 02/01/2014  . 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 Adenocarcinoma ([place]) [C80.1] 09/01/2013  . 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  HENT:  Head: Normocephalic.  Neck: Normal range of motion.  Cardiovascular: Normal rate.   Respiratory: Effort normal.  Musculoskeletal: Normal range of motion.  Neurological: She is alert.   Review of Systems  Psychiatric/Behavioral: Positive for depression and hallucinations. Negative for memory loss, substance abuse and suicidal ideas. The patient is not nervous/anxious and does not have insomnia.    Blood pressure (!) 107/54, pulse (!) 59, temperature 98.6 F (37 C), temperature source Oral, resp. rate 18, SpO2 100 %.There is no height or weight on file to calculate BMI. General Appearance: Casual Eye Contact:  Good Speech:  Garbled and Slow Volume:  Normal Mood:  Depressed Affect:   Congruent and Depressed Thought Process:  Coherent Orientation:  Full (Time, Place, and Person) Thought Content:  Logical Suicidal Thoughts:  No Homicidal Thoughts:  No Memory:  Immediate;   Good Recent;   Fair Remote;   Fair Judgement:  Poor Insight:  Lacking Psychomotor Activity:  Normal Concentration:  Concentration: Good and Attention Span: Good Recall:  [employer] of Knowledge:  Fair Language:  Fair Akathisia:  No Handed:  Right AIMS (if indicated):    Assets:  Financial Resources/Insurance Housing Social Support ADL's:  Intact Cognition:  Impaired,  Moderate   Mental Status Per Nursing Assessment::   On Admission:   Psychotic  Demographic Factors:  Age 66 or older  Loss Factors: Decline in physical health  Historical Factors: NA  Risk Reduction Factors:   NA  Continued Clinical Symptoms:  Schizophrenia:   Paranoid or undifferentiated type Previous Psychiatric Diagnoses and Treatments  Cognitive Features That Contribute To Risk:  Loss of executive function    Suicide Risk:  Minimal: No identifiable suicidal ideation.  Patients presenting with no risk factors but with morbid ruminations; may be classified as minimal risk based on the severity of the depressive symptoms    Plan Of Care/Follow-up recommendations:  Activity:  as tolerated Diet:  Heart Healthy  Ethelene Hal, NP 05/28/2017, 12:03 PM

## 2017-05-28 NOTE — BH Assessment (Addendum)
[place] Assessment Progress Note  Per Corena Pilgrim, MD, this pt does not require psychiatric hospitalization at this time.  Pt presents under IVC which Dr Darleene Cleaver has rescinded.  Pt is to be discharged from [facility] with recommendation to continue treatment with the [facility] ACT [facility], her current outpatient provider.  This has been included in pt's discharge instructions.  Pt's nurse, Caren Griffins, has been notified.  Jalene Mullet, [place] Triage Specialist [phone]

## 2017-05-29 ENCOUNTER — Encounter (HOSPITAL_COMMUNITY): Payer: Self-pay

## 2017-05-29 ENCOUNTER — Emergency Department (HOSPITAL_COMMUNITY)
Admission: EM | Admit: 2017-05-29 | Discharge: 2017-05-30 | Disposition: A | Discharge status: Home / Self Care | Payer: Medicare Other | Attending: Emergency Medicine | Admitting: Emergency Medicine

## 2017-05-29 ENCOUNTER — Emergency Department (HOSPITAL_COMMUNITY)
Admission: EM | Admit: 2017-05-29 | Discharge: 2017-05-29 | Discharge status: Against Medical Advice | Payer: Medicaid Other

## 2017-05-29 DIAGNOSIS — F1010 Alcohol abuse, uncomplicated: Secondary | ICD-10-CM | POA: Diagnosis not present

## 2017-05-29 DIAGNOSIS — C060 Malignant neoplasm of cheek mucosa: Secondary | ICD-10-CM | POA: Diagnosis not present

## 2017-05-29 DIAGNOSIS — Z046 Encounter for general psychiatric examination, requested by authority: Secondary | ICD-10-CM | POA: Diagnosis not present

## 2017-05-29 DIAGNOSIS — I10 Essential (primary) hypertension: Secondary | ICD-10-CM | POA: Insufficient documentation

## 2017-05-29 DIAGNOSIS — F17200 Nicotine dependence, unspecified, uncomplicated: Secondary | ICD-10-CM | POA: Insufficient documentation

## 2017-05-29 DIAGNOSIS — F319 Bipolar disorder, unspecified: Secondary | ICD-10-CM | POA: Diagnosis not present

## 2017-05-29 DIAGNOSIS — F29 Unspecified psychosis not due to a substance or known physiological condition: Secondary | ICD-10-CM | POA: Diagnosis not present

## 2017-05-29 DIAGNOSIS — F911 Conduct disorder, childhood-onset type: Secondary | ICD-10-CM | POA: Diagnosis not present

## 2017-05-29 DIAGNOSIS — R4689 Other symptoms and signs involving appearance and behavior: Secondary | ICD-10-CM

## 2017-05-29 DIAGNOSIS — F200 Paranoid schizophrenia: Secondary | ICD-10-CM | POA: Insufficient documentation

## 2017-05-29 DIAGNOSIS — F209 Schizophrenia, unspecified: Secondary | ICD-10-CM | POA: Diagnosis not present

## 2017-05-29 DIAGNOSIS — R4589 Other symptoms and signs involving emotional state: Secondary | ICD-10-CM | POA: Diagnosis not present

## 2017-05-29 DIAGNOSIS — F2089 Other schizophrenia: Secondary | ICD-10-CM | POA: Insufficient documentation

## 2017-05-29 LAB — COMPREHENSIVE METABOLIC PANEL
ALT: 14 U/L (ref 14–54)
ANION GAP: 9 (ref 5–15)
AST: 21 U/L (ref 15–41)
Albumin: 4.5 g/dL (ref 3.5–5.0)
Alkaline Phosphatase: 59 U/L (ref 38–126)
BUN: 25 mg/dL — ABNORMAL HIGH (ref 6–20)
CHLORIDE: 103 mmol/L (ref 101–111)
CO2: 26 mmol/L (ref 22–32)
Calcium: 10 mg/dL (ref 8.9–10.3)
Creatinine, Ser: 0.88 mg/dL (ref 0.44–1.00)
GFR calc non Af Amer: >60 mL/min (ref >60)
Glucose, Bld: 98 mg/dL (ref 65–99)
Potassium: 4.2 mmol/L (ref 3.5–5.1)
SODIUM: 138 mmol/L (ref 135–145)
Total Bilirubin: 0.9 mg/dL (ref 0.3–1.2)
Total Protein: 8.5 g/dL — ABNORMAL HIGH (ref 6.5–8.1)

## 2017-05-29 LAB — CBC
HEMATOCRIT: 34.6 % — AB (ref 36.0–46.0)
Hemoglobin: 12.1 g/dL (ref 12.0–15.0)
MCH: 34.1 pg — ABNORMAL HIGH (ref 26.0–34.0)
MCHC: 35 g/dL (ref 30.0–36.0)
MCV: 97.5 fL (ref 78.0–100.0)
Platelets: 254 10*3/uL (ref 150–400)
RBC: 3.55 MIL/uL — AB (ref 3.87–5.11)
RDW: 13.6 % (ref 11.5–15.5)
WBC: 5.3 10*3/uL (ref 4.0–10.5)

## 2017-05-29 LAB — ETHANOL: Alcohol, Ethyl (B): <5 mg/dL (ref <5)

## 2017-05-29 MED ORDER — THIAMINE HCL 100 MG/ML IJ SOLN: 100.0000 mg | Freq: Every day | INTRAMUSCULAR | Status: DC

## 2017-05-29 MED ORDER — LORAZEPAM 1 MG PO TABS: 0.0000 mg | ORAL_TABLET | Freq: Four times a day (QID) | ORAL | Status: DC

## 2017-05-29 MED ORDER — ALUM & MAG HYDROXIDE-SIMETH 200-200-20 MG/5ML PO SUSP: 30.0000 mL | Freq: Four times a day (QID) | ORAL | Status: DC | PRN

## 2017-05-29 MED ORDER — VITAMIN B-1 100 MG PO TABS
100.0000 mg | ORAL_TABLET | Freq: Every day | ORAL | Status: DC
  Administered 2017-05-30: 100 mg via ORAL
  Filled 2017-05-29: qty 1

## 2017-05-29 MED ORDER — LORAZEPAM 2 MG/ML IJ SOLN: 0.0000 mg | Freq: Four times a day (QID) | INTRAMUSCULAR | Status: DC

## 2017-05-29 MED ORDER — LORAZEPAM 1 MG PO TABS: 0.0000 mg | ORAL_TABLET | Freq: Two times a day (BID) | ORAL | Status: DC

## 2017-05-29 MED ORDER — LORAZEPAM 2 MG/ML IJ SOLN: 0.0000 mg | Freq: Two times a day (BID) | INTRAMUSCULAR | Status: DC

## 2017-05-29 MED ORDER — ACETAMINOPHEN 325 MG PO TABS: 650.0000 mg | ORAL_TABLET | ORAL | Status: DC | PRN
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
## 2017-05-29 NOTE — ED Triage Notes (Signed)
Pt bib [employer]PD with IVC.  Pt here earlier and refused to be seen.  Pt now IVC by team counselor.  Pt has schizophrenia and homeless.  Aggressive towards other residents in 3 different group home.  Hx of substance abuse.  Group Home:  [facility]
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
## 2017-05-29 NOTE — ED Notes (Signed)
Bed: WA27 Expected date:  Expected time:  Means of arrival:  Comments: Aileen Fass

## 2017-05-29 NOTE — ED Notes (Signed)
Patient called and refused to come to triage.

## 2017-05-29 NOTE — ED Notes (Signed)
Bed: [facility] Expected date:  Expected time:  Means of arrival:  Comments: 

## 2017-05-30 DIAGNOSIS — Z79899 Other long term (current) drug therapy: Secondary | ICD-10-CM

## 2017-05-30 DIAGNOSIS — F319 Bipolar disorder, unspecified: Secondary | ICD-10-CM

## 2017-05-30 DIAGNOSIS — F1010 Alcohol abuse, uncomplicated: Secondary | ICD-10-CM

## 2017-05-30 DIAGNOSIS — F209 Schizophrenia, unspecified: Secondary | ICD-10-CM | POA: Diagnosis not present

## 2017-05-30 DIAGNOSIS — F29 Unspecified psychosis not due to a substance or known physiological condition: Secondary | ICD-10-CM | POA: Diagnosis not present

## 2017-05-30 DIAGNOSIS — F17200 Nicotine dependence, unspecified, uncomplicated: Secondary | ICD-10-CM | POA: Diagnosis not present

## 2017-05-30 DIAGNOSIS — R4589 Other symptoms and signs involving emotional state: Secondary | ICD-10-CM | POA: Diagnosis not present

## 2017-05-30 DIAGNOSIS — R4689 Other symptoms and signs involving appearance and behavior: Secondary | ICD-10-CM | POA: Insufficient documentation

## 2017-05-30 DIAGNOSIS — Z590 Homelessness: Secondary | ICD-10-CM | POA: Diagnosis not present

## 2017-05-30 NOTE — Consult Note (Signed)
[place] Psychiatry Consult   Reason for Consult:  Schizophrenia Referring Physician:  EDP Patient Identification: Lorraine Tapia MRN:  [id number] Principal Diagnosis: Schizophrenia [facility])  Diagnosis:   Patient Active Problem List   Diagnosis Date Noted  . Aggressive behavior [R45.89]   . Osteopenia determined by x-ray [M85.80] 05/15/2017  . Schizophrenia ([place]) [F20.9] 09/18/2016  . Unintentional weight loss [R63.4] 06/25/2016  . Edentulism [K00.0] 06/25/2016  . Seizures ([place]) [R56.9]   . Vitamin D deficiency [E55.9] 03/27/2015  . RBC microcytosis [R71.8] 03/27/2015  . Heavy smoker [F17.200] 03/24/2015  . ETOH abuse [F10.10] 03/24/2015  . Bipolar disorder with psychotic features ([place]) [F31.9] 09/27/2014  . Other malignant neoplasm without specification of site [C80.1] 02/01/2014  . HTN (hypertension) [I10] 02/01/2014  . 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 Adenocarcinoma ([place]) [C80.1] 09/01/2013  . 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 female patient admitted with schizophrenic behavior.  HPI:  Lorraine Tapia is a 66 year old female who is well known to this and other local emergency rooms. Pt was discharged from this hospital on 05-28-17 and returned under IVC, placed by her ACTT team counselor, for schizophrenia and homelessness. Pt initially refused to be seen in triage yesterday and had to be handcuffed and escorted to triage. Pt stated she wanted to be discharged and lives in a group home. Pt was seen this morning by psychiatry and deemed stable and psychiatrically clear. Pt is at her baseline.   Past Psychiatric History: ETOH abuse, Bipolar disorder with psychosis, Schizophrenia  Risk to Self: None Risk to Others: None Prior Inpatient Therapy: Prior Inpatient Therapy:  (UTA) Prior Therapy Dates: UTA Prior Therapy  Facilty/Provider(s): UTA Reason for Treatment: UTA Prior Outpatient Therapy: Prior Outpatient Therapy: Yes Prior Therapy Dates: UTA Prior Therapy Facilty/Provider(s):  ([facility] ACTT team) Reason for Treatment:  (schizophrenia) Does patient have an ACCT team?: Yes Does patient have Intensive In-House Services?  : No Does patient have [facility] services? : Unknown Does patient have P4CC services?: Unknown  Past Medical History:  Past Medical History:  Diagnosis Date  . Bipolar disorder ([place])   . Cancer ([place]) 10-19-2013   mucosa cancer  . Depression   . ETOH abuse   . Hx of radiation therapy 02/21/14-04/06/14   right buccal 66y/29f  . Psychiatric disorder   . Shortness of breath    with exertion    Past Surgical History:  Procedure Laterality Date  . MULTIPLE EXTRACTIONS WITH ALVEOLOPLASTY N/A 02/01/2014   Procedure: Extraction of tooth #'s 6,7,8,10,11,12,15,16,22,23,25,26,27 with alveoloplasty.;  Surgeon: RLenn Cal DDS;  Location: [facility] ORS;  Service: Oral Surgery;  Laterality: N/A;  ANESTHESIA: GENERAL WITH NASAL TUBE  . Right buccal mucosa composite resection  10/19/2013   S/P right buccal mucosa composite resection with flap reconstruction and right modified neck dissection on 10/20/2039 [facility]  Family History:  Family History  Problem Relation Age of Onset  . Cancer Mother   . Hypertension Mother   . Hypertension Father    Family Psychiatric  History: Unknown Social History:  History  Alcohol Use  . 1.2 oz/week  . 2 Cans of beer per week    Comment:  2 24  oz beer occasionally     History  Drug Use No    Social History   Social  History  . Marital status: Single    Spouse name: N/A  . Number of children: N/A  . Years of education: N/A   Occupational History  .      Disability   Social History Main Topics  . Smoking status: Current Some Day Smoker  . Smokeless tobacco: None  . Alcohol use 1.2 oz/week     2 Cans of beer per week     Comment:  2 24  oz beer occasionally  . Drug use: No  . Sexual activity: Yes   Other Topics Concern  . None   Social History Narrative   ** Merged History Encounter **       Additional Social History:    Allergies:   Allergies  Allergen Reactions  . Other Itching    Rash and itching with pepper    Labs:  Results for orders placed or performed during the hospital encounter of 05/29/17 (from the past 48 hour(s))  Comprehensive metabolic panel     Status: Abnormal   Collection Time: 05/29/17  5:47 PM  Result Value Ref Range   Sodium 138 135 - 145 mmol/L   Potassium 4.2 3.5 - 5.1 mmol/L   Chloride 103 101 - 111 mmol/L   CO2 26 22 - 32 mmol/L   Glucose, Bld 98 65 - 99 mg/dL   BUN 25 (H) 6 - 20 mg/dL   Creatinine, Ser 0.88 0.44 - 1.00 mg/dL   Calcium 10.0 8.9 - 10.3 mg/dL   Total Protein 8.5 (H) 6.5 - 8.1 g/dL   Albumin 4.5 3.5 - 5.0 g/dL   AST 21 15 - 41 U/L   ALT 14 14 - 54 U/L   Alkaline Phosphatase 59 38 - 126 U/L   Total Bilirubin 0.9 0.3 - 1.2 mg/dL   GFR calc non Af Amer >60 >60 mL/min   GFR calc Af Amer >60 >60 mL/min    Comment: (NOTE) The eGFR has been calculated using the CKD EPI equation. This calculation has not been validated in all clinical situations. eGFR's persistently <60 mL/min signify possible Chronic Kidney Disease.    Anion gap 9 5 - 15  Ethanol     Status: None   Collection Time: 05/29/17  5:47 PM  Result Value Ref Range   Alcohol, Ethyl (B) <5 <5 mg/dL    Comment:        LOWEST DETECTABLE LIMIT FOR SERUM ALCOHOL IS 5 mg/dL FOR MEDICAL PURPOSES ONLY   cbc     Status: Abnormal   Collection Time: 05/29/17  5:47 PM  Result Value Ref Range   WBC 5.3 4.0 - 10.5 K/uL   RBC 3.55 (L) 3.87 - 5.11 MIL/uL   Hemoglobin 12.1 12.0 - 15.0 g/dL   HCT 34.6 (L) 36.0 - 46.0 %   MCV 97.5 78.0 - 100.0 fL   MCH 34.1 (H) 26.0 - 34.0 pg   MCHC 35.0 30.0 - 36.0 g/dL   RDW 13.6 11.5 - 15.5 %   Platelets 254 150 - 400 K/uL     Current Facility-Administered Medications  Medication Dose Route Frequency Provider Last Rate Last Dose  . acetaminophen (TYLENOL) tablet 650 mg  650 mg Oral Q4H PRN Duffy Bruce, MD      . alum & mag hydroxide-simeth (MAALOX/MYLANTA) 200-200-20 MG/5ML suspension 30 mL  30 mL Oral Q6H PRN Duffy Bruce, MD      . LORazepam (ATIVAN) injection 0-4 mg  0-4 mg Intravenous Q6H Duffy Bruce, MD  Or  . LORazepam (ATIVAN) tablet 0-4 mg  0-4 mg Oral Q6H Duffy Bruce, MD      . Derrill Memo ON 06/01/2017] LORazepam (ATIVAN) injection 0-4 mg  0-4 mg Intravenous Q12H Duffy Bruce, MD       Or  . Derrill Memo ON 06/01/2017] LORazepam (ATIVAN) tablet 0-4 mg  0-4 mg Oral Q12H Duffy Bruce, MD      . thiamine (VITAMIN B-1) tablet 100 mg  100 mg Oral Daily Duffy Bruce, MD   100 mg at 05/30/17 1610   Or  . thiamine (B-1) injection 100 mg  100 mg Intravenous Daily Duffy Bruce, MD       Current Outpatient Prescriptions  Medication Sig Dispense Refill  . Calcium Citrate 200 MG TABS Take 2 tablets (400 mg total) by mouth daily. (Patient not taking: Reported on 05/25/2017) 960 tablet 3  . folic acid (FOLVITE) 1 MG tablet Take 1 tablet (1 mg total) by mouth daily. (Patient not taking: Reported on 09/17/2016) 30 tablet 5  . Multiple Vitamins-Minerals (MULTIVITAMIN) tablet Take 1 tablet by mouth daily. (Patient not taking: Reported on 09/17/2016) 30 tablet 5  . thiamine (VITAMIN B-1) 100 MG tablet Take 1 tablet (100 mg total) by mouth daily. (Patient not taking: Reported on 09/17/2016) 30 tablet 5  . Vitamin D, Ergocalciferol, (DRISDOL) 50000 units CAPS capsule Take 1 capsule (50,000 Units total) by mouth every 30 (thirty) days. (Patient not taking: Reported on 05/25/2017) 4 capsule 3    Musculoskeletal: Strength & Muscle Tone: within normal limits Gait & Station: normal Patient leans: N/A  Psychiatric Specialty Exam: Physical Exam  Constitutional: She appears well-developed and well-nourished.   HENT:  Head: Normocephalic.  Respiratory: Effort normal.  Musculoskeletal: Normal range of motion.    Review of Systems  Psychiatric/Behavioral: Positive for depression and hallucinations. Negative for memory loss, substance abuse and suicidal ideas. The patient is not nervous/anxious and does not have insomnia.     Blood pressure 140/68, pulse 78, temperature 98 F (36.7 C), temperature source Oral, resp. rate 18, SpO2 100 %.There is no height or weight on file to calculate BMI.  General Appearance: Disheveled  Eye Contact:  Good  Speech:  Garbled  Volume:  Normal  Mood:  Angry and Irritable  Affect:  Congruent  Thought Process:  Disorganized  Orientation:  Full (Time, Place, and Person)  Thought Content:  Illogical  Suicidal Thoughts:  No  Homicidal Thoughts:  No  Memory:  Immediate;   Fair Recent;   Fair Remote;   Fair  Judgement:  Poor  Insight:  Shallow  Psychomotor Activity:  Normal  Concentration:  Concentration: Poor and Attention Span: Poor  Recall:  [employer] of Knowledge:  Fair  Language:  Good  Akathisia:  No  Handed:  Right  AIMS (if indicated):     Assets:  Agricultural consultant Housing Resilience Social Support  ADL's:  Intact  Cognition:  WNL  Sleep:        Treatment Plan Summary: Plan Schizophrenia  Discharge Home Follow up with ACTT team counselor for medication management and therapy  Disposition: No evidence of imminent risk to self or others at present.   Patient does not meet criteria for psychiatric inpatient admission.  Ethelene Hal, NP 05/30/2017 2:38 PM

## 2017-05-30 NOTE — Progress Notes (Signed)
CSW contacted [facility] ACT Team for transportation. ACT Team unable to provide time frame for when they can pick patient up. Patient is currently homeless and is own legal guardian. CSW provided patient with bus pass and shelter resources.   Kingsley Spittle, LCSWA Clinical Social Worker [phone]

## 2017-05-30 NOTE — BH Assessment (Addendum)
[place] Assessment Progress Note  Per Ambrose Finland, MD, this pt does not require psychiatric hospitalization at this time.  Pt presents under IVC initiated by pt's ACT Team Counselor, which Dr Louretta Shorten has rescinded.  Pt is to be discharged from [facility] with recommendation to continue treatment with the [facility] ACT Team.  This has been included in pt's discharge instructions.  Pt's nurse has been notified.  Jalene Mullet, [place] Triage Specialist [phone]

## 2017-05-30 NOTE — ED Notes (Signed)
Dr. Lenna Sciara psych Provider at bedside.

## 2017-05-30 NOTE — [facility] Suicide Risk Assessment (Signed)
Suicide Risk Assessment  Discharge Assessment   [facility] Discharge Suicide Risk Assessment   Principal Problem: Schizophrenia [facility]) Discharge Diagnoses:  Patient Active Problem List   Diagnosis Date Noted  . Schizophrenia ([place]) [F20.9] 09/18/2016    Priority: High  . Aggressive behavior [R45.89]   . Osteopenia determined by x-ray [M85.80] 05/15/2017  . Unintentional weight loss [R63.4] 06/25/2016  . Edentulism [K00.0] 06/25/2016  . Seizures ([place]) [R56.9]   . Vitamin D deficiency [E55.9] 03/27/2015  . RBC microcytosis [R71.8] 03/27/2015  . Heavy smoker [F17.200] 03/24/2015  . ETOH abuse [F10.10] 03/24/2015  . Bipolar disorder with psychotic features ([place]) [F31.9] 09/27/2014  . Other malignant neoplasm without specification of site [C80.1] 02/01/2014  . HTN (hypertension) [I10] 02/01/2014  . 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 Adenocarcinoma ([place]) [C80.1] 09/01/2013  . 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  HENT:  Head: Normocephalic.  Respiratory: Effort normal.  Musculoskeletal: Normal range of motion.   Review of Systems  Psychiatric/Behavioral: Positive for depression and hallucinations. Negative for memory loss, substance abuse and suicidal ideas. The patient is not nervous/anxious and does not have insomnia.    Blood pressure 140/68, pulse 78, temperature 98 F (36.7 C), temperature source Oral, resp. rate 18, SpO2 100 %.There is no height or weight on file to calculate BMI. General Appearance: Disheveled Eye Contact:  Good Speech:  Garbled Volume:  Normal Mood:  Angry and Irritable Affect:  Congruent Thought Process:  Disorganized Orientation:  Full (Time, Place,  and Person) Thought Content:  Illogical Suicidal Thoughts:  No Homicidal Thoughts:  No Memory:  Immediate;   Fair Recent;   Fair Remote;   Fair Judgement:  Poor Insight:  Shallow Psychomotor Activity:  Normal Concentration:  Concentration: Poor and Attention Span: Poor Recall:  [employer] of Knowledge:  Fair Language:  Good Akathisia:  No Handed:  Right AIMS (if indicated):    Assets:  Agricultural consultant Housing Resilience Social Support ADL's:  Intact Cognition:  WNL Sleep:     Mental Status Per Nursing Assessment::   On Admission:     Demographic Factors:  Low socioeconomic status, Living alone and Unemployed  Loss Factors: Financial problems/change in socioeconomic status  Historical Factors: Impulsivity  Risk Reduction Factors:   NA  Continued Clinical Symptoms:  Schizophrenia:   Paranoid or undifferentiated type More than one psychiatric diagnosis  Cognitive Features That Contribute To Risk:  Loss of executive function    Suicide Risk:  Minimal: No identifiable suicidal ideation.  Patients presenting with no risk factors but with morbid ruminations; may be classified as minimal risk based on the severity of the depressive symptoms    Plan Of Care/Follow-up recommendations:  Activity:  as tolerated Diet:  Heart healthy  Ethelene Hal, NP 05/30/2017, 2:51 PM

## 2017-05-30 NOTE — Progress Notes (Signed)
Received call from Lorraine Tapia, case manager with [facility], [phone].  Ms. Lorraine Tapia shared that she is Lorraine Tapia' Representative Payee and expressed  concern about her frequent trips to the ED and homelessness as a result of having been asked to leave 3 group homes in the last few weeks, and asking why we haven't placed her in an inpatient psychiatric facility.  Explained that per psychiatric evaluations completed and despite her multiple behavioral health diagnoses, Lorraine Tapia does not meet criteria for involuntary commitment to an inpatient psychiatric facility under [place] law.  Encouraged Lorraine Tapia to work with Lorraine Tapia' behavioral health providers to review her outpatient medication regimen as well as to pursue a formal capacity evaluation in the community.  Ms. Lorraine Tapia indicated she was appreciative of the recommendations and will follow up.  Lorraine Tapia, ACSW, Investment banker, operational, Clinical Social Work and Care Management [phone]

## 2017-05-30 NOTE — ED Notes (Signed)
Pt refused shower.  

## 2017-05-30 NOTE — Discharge Instructions (Signed)
For your ongoing behavioral health needs, you are advised to continue treatment with the [facility] ACT Team:       [facility]      [address],  [address]      [phone]

## 2018-01-29 ENCOUNTER — Emergency Department (HOSPITAL_COMMUNITY): Payer: Medicare Other

## 2018-01-29 ENCOUNTER — Emergency Department (HOSPITAL_COMMUNITY)
Admission: EM | Admit: 2018-01-29 | Discharge: 2018-01-30 | Disposition: A | Discharge status: Home / Self Care | Payer: Medicare Other | Attending: Emergency Medicine | Admitting: Emergency Medicine

## 2018-01-29 DIAGNOSIS — R402441 Other coma, without documented Glasgow coma scale score, or with partial score reported, in the field [EMT or ambulance]: Secondary | ICD-10-CM | POA: Diagnosis not present

## 2018-01-29 DIAGNOSIS — R9431 Abnormal electrocardiogram [ECG] [EKG]: Secondary | ICD-10-CM | POA: Diagnosis not present

## 2018-01-29 DIAGNOSIS — F17200 Nicotine dependence, unspecified, uncomplicated: Secondary | ICD-10-CM | POA: Insufficient documentation

## 2018-01-29 DIAGNOSIS — R4182 Altered mental status, unspecified: Secondary | ICD-10-CM | POA: Insufficient documentation

## 2018-01-29 DIAGNOSIS — D649 Anemia, unspecified: Secondary | ICD-10-CM | POA: Diagnosis not present

## 2018-01-29 DIAGNOSIS — R451 Restlessness and agitation: Secondary | ICD-10-CM | POA: Diagnosis not present

## 2018-01-29 DIAGNOSIS — R509 Fever, unspecified: Secondary | ICD-10-CM

## 2018-01-29 DIAGNOSIS — F209 Schizophrenia, unspecified: Secondary | ICD-10-CM | POA: Insufficient documentation

## 2018-01-29 DIAGNOSIS — R17 Unspecified jaundice: Secondary | ICD-10-CM | POA: Diagnosis not present

## 2018-01-29 DIAGNOSIS — R443 Hallucinations, unspecified: Secondary | ICD-10-CM | POA: Diagnosis not present

## 2018-01-29 LAB — CBC WITH DIFFERENTIAL/PLATELET
BASOS ABS: 0 10*3/uL (ref 0.0–0.1)
BLASTS: 0 %
Band Neutrophils: 0 %
Basophils Relative: 0 %
Eosinophils Absolute: 0.1 10*3/uL (ref 0.0–0.7)
Eosinophils Relative: 1 %
HEMATOCRIT: 31.9 % — AB (ref 36.0–46.0)
HEMOGLOBIN: 10.9 g/dL — AB (ref 12.0–15.0)
Lymphocytes Relative: 22 %
Lymphs Abs: 1.3 10*3/uL (ref 0.7–4.0)
MCH: 33.7 pg (ref 26.0–34.0)
MCHC: 34.2 g/dL (ref 30.0–36.0)
MCV: 98.8 fL (ref 78.0–100.0)
METAMYELOCYTES PCT: 0 %
MYELOCYTES: 0 %
Monocytes Absolute: 0.6 10*3/uL (ref 0.1–1.0)
Monocytes Relative: 11 %
NEUTROS ABS: 3.8 10*3/uL (ref 1.7–7.7)
Neutrophils Relative %: 66 %
Other: 0 %
PROMYELOCYTES ABS: 0 %
Platelets: 245 10*3/uL (ref 150–400)
RBC: 3.23 MIL/uL — AB (ref 3.87–5.11)
RDW: 13.5 % (ref 11.5–15.5)
WBC: 5.8 10*3/uL (ref 4.0–10.5)
nRBC: 0 /100 WBC

## 2018-01-29 LAB — COMPREHENSIVE METABOLIC PANEL
ALT: 15 U/L (ref 14–54)
ANION GAP: 12 (ref 5–15)
AST: 38 U/L (ref 15–41)
Albumin: 3.8 g/dL (ref 3.5–5.0)
Alkaline Phosphatase: 71 U/L (ref 38–126)
BILIRUBIN TOTAL: 1.6 mg/dL — AB (ref 0.3–1.2)
BUN: 14 mg/dL (ref 6–20)
CALCIUM: 9.3 mg/dL (ref 8.9–10.3)
CO2: 24 mmol/L (ref 22–32)
Chloride: 100 mmol/L — ABNORMAL LOW (ref 101–111)
Creatinine, Ser: 0.81 mg/dL (ref 0.44–1.00)
GFR calc Af Amer: >60 mL/min (ref >60)
Glucose, Bld: 66 mg/dL (ref 65–99)
POTASSIUM: 4 mmol/L (ref 3.5–5.1)
Sodium: 136 mmol/L (ref 135–145)
TOTAL PROTEIN: 7.9 g/dL (ref 6.5–8.1)

## 2018-01-29 LAB — I-STAT CG4 LACTIC ACID, ED: Lactic Acid, Venous: 0.76 mmol/L (ref 0.5–1.9)

## 2018-01-29 LAB — ETHANOL

## 2018-01-29 LAB — ACETAMINOPHEN LEVEL: Acetaminophen (Tylenol), Serum: <10 ug/mL — ABNORMAL LOW (ref 10–30)

## 2018-01-29 LAB — AMMONIA: Ammonia: 23 umol/L (ref 9–35)

## 2018-01-29 LAB — SALICYLATE LEVEL

## 2018-01-29 MED ORDER — NICOTINE 21 MG/24HR TD PT24: 21.0000 mg | MEDICATED_PATCH | Freq: Every day | TRANSDERMAL | Status: DC

## 2018-01-29 MED ORDER — LORAZEPAM 2 MG/ML IJ SOLN
1.0000 mg | Freq: Once | INTRAMUSCULAR | Status: AC
  Administered 2018-01-29: 1 mg via INTRAMUSCULAR
  Filled 2018-01-29: qty 1

## 2018-01-29 MED ORDER — LORAZEPAM 2 MG/ML IJ SOLN
2.0000 mg | Freq: Once | INTRAMUSCULAR | Status: AC
  Administered 2018-01-29: 2 mg via INTRAVENOUS
  Filled 2018-01-29: qty 1

## 2018-01-29 MED ORDER — SODIUM CHLORIDE 0.9 % IV SOLN
INTRAVENOUS | Status: DC
  Administered 2018-01-29: 23:00:00 via INTRAVENOUS

## 2018-01-29 MED ORDER — ACETAMINOPHEN 325 MG PO TABS: 650.0000 mg | ORAL_TABLET | ORAL | Status: DC | PRN

## 2018-01-29 MED ORDER — SODIUM CHLORIDE 0.9 % IV BOLUS (SEPSIS)
1000.0000 mL | Freq: Once | INTRAVENOUS | Status: AC
  Administered 2018-01-29: 1000 mL via INTRAVENOUS

## 2018-01-29 NOTE — ED Notes (Signed)
Pt refusing to change into gown. Unable to obtain EKG. Dewitt Hoes, [place] aware.

## 2018-01-29 NOTE — ED Notes (Signed)
Patient transported to CT 

## 2018-01-29 NOTE — ED Provider Notes (Signed)
[facility]GENC[facility] DEPARTMENT Provider Note   CSN: [id number] Arrival date & time: 01/29/18  1753     History   Chief Complaint Chief Complaint  Patient presents with  . Altered Mental Status    HPI Lorraine Tapia is a 67 y.o. female with a PMHx of bipolar disorder, EtOH abuse, depression, schizophrenia, seizures, HTN, R buccal cancer s/p resection, and other conditions listed below, who presents to the ED via [employer] with complaints of AMS.  LEVEL 5 CAVEAT DUE TO AMS, some of the history provided by police.  Per [employer], they were called out to [employer] for pt trespassing, when they arrived the pt wouldn't answer questions and was rambling incoherently about "god" and "the devil".  They reported that she seemed "very out of it" and the only thing they could understand was that she kept repeating "[facility]", seeming to indicate that she wanted to come here for evaluation.  She did not resist getting into the ambulance, however she required some assistance to get in.  They deny that she ever had a syncopal event, they just reported that she seemed "very out of it".  She is NOT under IVC however they did get a warrant for her trespassing violation.  Patient does not answer any questions and rambles about a variety of different topics, but refuses to answer any questions.  Uses profane language.  Due to patient not answering questions, history and ROS are extremely limited.   The history is provided by the patient, medical records and the police. The history is limited by the condition of the patient. No language interpreter was used.  Altered Mental Status   This is a new problem. The current episode started less than 1 hour ago. The problem has not changed since onset.Her past medical history is significant for depression.    Past Medical History:  Diagnosis Date  . Bipolar disorder ([place])   . Cancer ([place]) 10-19-2013   mucosa cancer  . Depression     . ETOH abuse   . Hx of radiation therapy 02/21/14-04/06/14   right buccal 66y/81fx  . Psychiatric disorder   . Shortness of breath    with exertion    Patient Active Problem List   Diagnosis Date Noted  . Aggressive behavior   . Osteopenia determined by x-ray 05/15/2017  . Schizophrenia ([place]) 09/18/2016  . Unintentional weight loss 06/25/2016  . Edentulism 06/25/2016  . Seizures ([place])   . Vitamin D deficiency 03/27/2015  . RBC microcytosis 03/27/2015  . Heavy smoker 03/24/2015  . ETOH abuse 03/24/2015  . Bipolar disorder with psychotic features ([place]) 09/27/2014  . Other malignant neoplasm without specification of site 02/01/2014  . HTN (hypertension) 02/01/2014  . 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 Adenocarcinoma ([place]) 09/01/2013  . 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 MULTIPLE EXTRACTIONS WITH ALVEOLOPLASTY N/A 02/01/2014   Procedure: Extraction of tooth #'s 6,7,8,10,11,12,15,16,22,23,25,26,27 with alveoloplasty.;  Surgeon: Lenn Cal, DDS;  Location: [facility] ORS;  Service: Oral Surgery;  Laterality: N/A;  ANESTHESIA: GENERAL WITH NASAL TUBE  . Right buccal mucosa composite resection  10/19/2013   S/P right buccal mucosa composite resection with flap reconstruction and right modified neck dissection on 10/20/2039 by Dr. Fredricka Bonine at [facility] History    No data available       Home Medications    Prior to  Admission medications   Medication Sig Start Date End Date Taking? Authorizing Provider  Calcium Citrate 200 MG TABS Take 2 tablets (400 mg total) by mouth daily. Patient not taking: Reported on 05/25/2017 05/15/17   Boykin Nearing, MD  folic acid (FOLVITE) 1 MG tablet Take 1 tablet (1 mg total) by mouth daily. Patient not taking: Reported on 09/17/2016 06/25/16   Boykin Nearing, MD  Multiple Vitamins-Minerals (MULTIVITAMIN) tablet Take 1 tablet by  mouth daily. Patient not taking: Reported on 09/17/2016 06/25/16   Boykin Nearing, MD  thiamine (VITAMIN B-1) 100 MG tablet Take 1 tablet (100 mg total) by mouth daily. Patient not taking: Reported on 09/17/2016 06/25/16   Boykin Nearing, MD  Vitamin D, Ergocalciferol, (DRISDOL) 50000 units CAPS capsule Take 1 capsule (50,000 Units total) by mouth every 30 (thirty) days. Patient not taking: Reported on 05/25/2017 05/15/17   Boykin Nearing, MD    Family History Family History  Problem Relation Age of Onset  . Cancer Mother   . Hypertension Mother   . Hypertension Father     Social History Social History   Tobacco Use  . Smoking status: Current Some Day Smoker  Substance Use Topics  . Alcohol use: Yes    Alcohol/week: 1.2 oz    Types: 2 Cans of beer per week    Comment:  2 24  oz beer occasionally  . Drug use: No     Allergies   Other   Review of Systems Review of Systems  Unable to perform ROS: Mental status change   LEVEL 5 CAVEAT DUE TO AMS  Physical Exam Updated Vital Signs BP (!) 170/85 (BP Location: Right Arm)   Pulse 79   Temp 100.3 F (37.9 C) (Oral)   Resp 16   SpO2 94%   Physical Exam  Constitutional: Vital signs are normal. She appears well-developed and well-nourished. She is uncooperative.  Non-toxic appearance. No distress.  Low grade temp 100.3, nontoxic, not septic appearing, rambling with flight of ideas, disheveled appearance, but in no acute distress otherwise. Wearing a thick jacket that she refuses to take off.   HENT:  Head: Normocephalic and atraumatic.  Mouth/Throat: Oropharynx is clear and moist and mucous membranes are normal.  R cheek bulbous, ?from prior surgeries Edentulous  Eyes: Conjunctivae and EOM are normal. Right eye exhibits no discharge. Left eye exhibits no discharge.  Neck: Normal range of motion. Neck supple. No spinous process tenderness and no muscular tenderness present. No neck rigidity. Normal range of motion  present.  No meningismus, FROM intact, no focal areas of tenderness.   Cardiovascular: Normal rate, regular rhythm, normal heart sounds and intact distal pulses. Exam reveals no gallop and no friction rub.  No murmur heard. Pulmonary/Chest: Effort normal and breath sounds normal. No respiratory distress. She has no decreased breath sounds. She has no wheezes. She has no rhonchi. She has no rales.  Pt doesn't cooperate with taking full inspirations however overall CTAB in all lung fields based on limited exam, no w/r/r appreciated, no hypoxia or increased WOB, speaking in full rambling sentences, SpO2 94% on RA   Abdominal: Soft. Normal appearance and bowel sounds are normal. She exhibits no distension. There is no tenderness. There is no rigidity, no rebound, no guarding, no CVA tenderness, no tenderness at McBurney's point and negative Murphy's sign.  Soft, NTND, +BS throughout, no r/g/r, neg murphy's, neg mcburney's, no CVA TTP   Musculoskeletal: Normal range of motion.  MAE x4 Strength and sensation grossly intact  Distal pulses intact  Neurological: She is alert. She has normal strength. No sensory deficit.  Alert, but will not answer orientation questions, rambles with flight of ideas, uncooperative with exam.   Skin: Skin is warm, dry and intact. No rash noted.  Psychiatric: Her affect is inappropriate. Her speech is tangential. She is actively hallucinating. She is noncommunicative.  Rambling with flight of ideas, uncooperative with evaluation and won't answer questions.  Somewhat tangential but never really comes back to original questions, just keeps rambling about a variety of different topics. Seems to possibly be talking to someone not actually present in the room, possibly having either visual or auditory hallucinations.   Nursing note and vitals reviewed.    ED Treatments / Results  Labs (all labs ordered are listed, but only abnormal results are displayed) Labs Reviewed    COMPREHENSIVE METABOLIC PANEL - Abnormal; Notable for the following components:      Result Value   Chloride 100 (*)    Total Bilirubin 1.6 (*)    All other components within normal limits  CBC WITH DIFFERENTIAL/PLATELET - Abnormal; Notable for the following components:   RBC 3.23 (*)    Hemoglobin 10.9 (*)    HCT 31.9 (*)    All other components within normal limits  ACETAMINOPHEN LEVEL - Abnormal; Notable for the following components:   Acetaminophen (Tylenol), Serum <10 (*)    All other components within normal limits  URINE CULTURE  CULTURE, BLOOD (ROUTINE X 2)  ETHANOL  SALICYLATE LEVEL  URINALYSIS, COMPLETE (UACMP) WITH MICROSCOPIC  AMMONIA  RAPID URINE DRUG SCREEN, HOSP PERFORMED  I-STAT CG4 LACTIC ACID, ED    EKG  EKG Interpretation  Date/Time:  Thursday January 29 2018 19:11:43 EST Ventricular Rate:  79 PR Interval:    QRS Duration: 65 QT Interval:  382 QTC Calculation: 438 R Axis:   41 Text Interpretation:  Sinus rhythm Baseline wander in lead(s) V6 T wave abnormality Artifact Abnormal ekg Confirmed by Carmin Muskrat [phone]) on 01/29/2018 7:57:20 PM       Radiology Ct Head Wo Contrast  Result Date: 01/29/2018 CLINICAL DATA:  67 year old female with altered mental status. EXAM: CT HEAD WITHOUT CONTRAST TECHNIQUE: Contiguous axial images were obtained from the base of the skull through the vertex without intravenous contrast. COMPARISON:  Head CT dated 02/04/2016 FINDINGS: Brain: There is mild age-related atrophy and chronic microvascular ischemic changes. There is no acute intracranial hemorrhage. No mass effect or midline shift. No extra-axial fluid collection. Vascular: No hyperdense vessel or unexpected calcification. Skull: Normal. Negative for fracture or focal lesion. Sinuses/Orbits: There is diffuse mucoperiosteal thickening of paranasal sinuses with complete opacification of the right maxillary sinus and right ethmoid air cells. The mastoid air cells are  clear. Other: None IMPRESSION: 1. No acute intracranial pathology. 2. Mild age-related atrophy and chronic microvascular ischemic changes. 3. Extensive paranasal sinus disease. Electronically Signed   By: Anner Crete M.D.   On: 01/29/2018 21:54   Dg Chest Port 1 View  Result Date: 01/29/2018 CLINICAL DATA:  Fever EXAM: PORTABLE CHEST 1 VIEW COMPARISON:  05/24/2017 FINDINGS: Mild cardiomegaly. No confluent airspace opacity, effusion or edema. No acute bony abnormality. IMPRESSION: Mild cardiomegaly.  No active disease. Electronically Signed   By: Rolm Baptise M.D.   On: 01/29/2018 21:32    Procedures Procedures (including critical care time)  Medications Ordered in ED Medications  sodium chloride 0.9 % bolus 1,000 mL (0 mLs Intravenous Stopped 01/29/18 2057)    And  0.9 %  sodium chloride infusion (not administered)  LORazepam (ATIVAN) injection 1 mg (1 mg Intramuscular Given 01/29/18 1915)  LORazepam (ATIVAN) injection 2 mg (2 mg Intravenous Given 01/29/18 2003)     Initial Impression / Assessment and Plan / ED Course  I have reviewed the triage vital signs and the nursing notes.  Pertinent labs & imaging results that were available during my care of the patient were reviewed by me and considered in my medical decision making (see chart for details).     67 y.o. female here after [employer] was called regarding her trespassing, and found her to be rambling incoherently and not really answering their questions. She indicated that she wanted to come here so they brought her. Upon arrival, pt rambling about various topics, appears to be possibly talking to someone in the room who isn't present, and will not answer any questions. Using profanity. Temp 100.3, but otherwise she has no SIRS criteria and does not appear septic. She doesn't cooperate with history, so level 5 caveat applies. She is disheveled. No abdominal tenderness. Clear lung exam based on limited exam however pt uncooperative with  taking deep breaths so unable to get good evaluation. No meningismus noted, doubt meningitis at this point. Will get labs, CT head, CXR, and give fluids then reassess shortly. Will hold off on calling code sepsis for now. Discussed case with my attending Dr. Vanita Panda who agrees with plan.   7:55 PM Pt was refusing CT, therefore 1mg  IM ativan was given and she has allowed nursing staff to get IV started and get blood (except not second set of BCx, which for now we will hold off on). She is in CXR now and she's not cooperating, will change it to a portable and order 2mg  IV ativan to see if this helps [place] get more of her work up completed. Thus far we have a lactic which is unremarkable, and the EKG which is unremarkable and nonischemic. Remainder of work up pending. Will also have nursing staff recheck her temp as well. Will reassess shortly.   10:08 PM CBC w/diff with mild anemia somewhat close to labs from 09/2016, no leukocytosis. CMP with marginally elevated Tbili 1.6 also similar to labs from 09/2016, otherwise remainder of CMP reassuring/unremarkable. Salicylate level negative. Acetaminophen level negative. EtOH level undetectable. CT head without acute findings, age related atrophy and chronic microvascular ischemic findings noted but nothing acute. CXR with mild cardiomegaly but otherwise unremarkable.  Recheck temp 98.6 without any antipyretics, doubt pt had a true fever earlier.  Ammonia and urine studies pending. Pt somnolent after 2mg  IV ativan was given, but has been resting comfortably since then. I suspect much of this was psychiatric in nature, and she likely warrants psychiatric evaluation once she's awake and able to participate further. Patient care to be resumed by Antonietta Breach, PA-C at shift change sign-out. Patient history has been discussed with midlevel resuming care. Please see their notes for further documentation of pending results and dispo/care. Pt stable at sign-out and updated on  transfer of care.    Final Clinical Impressions(s) / ED Diagnoses   Final diagnoses:  Altered mental status, unspecified altered mental status type  Anemia, unspecified type  Hyperbilirubinemia  Agitation    ED Discharge Orders    [address], [place], [place] 01/29/18 2208    Carmin Muskrat, MD 02/05/18 1213

## 2018-01-29 NOTE — ED Notes (Signed)
Pt refused RN to collect 2nd set of blood cultures.

## 2018-01-29 NOTE — ED Triage Notes (Signed)
Per [facility], Pt was found trespassing. Pt has hx of schizophrenia. Pt speaking constantly, word salad, unintelligible. Pt unable to to answer any questions with appropriate answers.

## 2018-01-29 NOTE — BH Assessment (Signed)
[facility] Assessment Progress Note   Clinician spoke to [place], [place] for patient.  She said that patient had been given 3mg  ativan earlier and was unable to stay awake at this time for an assessment.  She said also that patient may be difficult to assess since she has "word salad" presentation with conversation.  TTS will attempt to assess patient when she is alert.

## 2018-01-30 ENCOUNTER — Encounter (HOSPITAL_COMMUNITY): Payer: Self-pay | Admitting: Registered Nurse

## 2018-01-30 ENCOUNTER — Other Ambulatory Visit: Payer: Self-pay

## 2018-01-30 DIAGNOSIS — R17 Unspecified jaundice: Secondary | ICD-10-CM | POA: Diagnosis not present

## 2018-01-30 DIAGNOSIS — F17200 Nicotine dependence, unspecified, uncomplicated: Secondary | ICD-10-CM | POA: Diagnosis not present

## 2018-01-30 DIAGNOSIS — R451 Restlessness and agitation: Secondary | ICD-10-CM | POA: Diagnosis not present

## 2018-01-30 DIAGNOSIS — R4182 Altered mental status, unspecified: Secondary | ICD-10-CM | POA: Diagnosis not present

## 2018-01-30 DIAGNOSIS — F209 Schizophrenia, unspecified: Secondary | ICD-10-CM | POA: Diagnosis not present

## 2018-01-30 DIAGNOSIS — D649 Anemia, unspecified: Secondary | ICD-10-CM | POA: Diagnosis not present

## 2018-01-30 LAB — URINALYSIS, COMPLETE (UACMP) WITH MICROSCOPIC
BACTERIA UA: NONE SEEN
Bilirubin Urine: NEGATIVE
Glucose, UA: NEGATIVE mg/dL
Hgb urine dipstick: NEGATIVE
Ketones, ur: 20 mg/dL — AB
Leukocytes, UA: NEGATIVE
NITRITE: NEGATIVE
PH: 5 (ref 5.0–8.0)
Protein, ur: NEGATIVE mg/dL
SPECIFIC GRAVITY, URINE: 1.014 (ref 1.005–1.030)
SQUAMOUS EPITHELIAL / LPF: NONE SEEN

## 2018-01-30 LAB — RAPID URINE DRUG SCREEN, HOSP PERFORMED
Amphetamines: NOT DETECTED
BENZODIAZEPINES: NOT DETECTED
Barbiturates: NOT DETECTED
COCAINE: NOT DETECTED
Opiates: NOT DETECTED
TETRAHYDROCANNABINOL: NOT DETECTED

## 2018-01-30 NOTE — ED Notes (Signed)
Regular Diet ordered for Lunch. 
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
## 2018-01-30 NOTE — Progress Notes (Signed)
Patient is psych cleared.  Pt reports that her half-sister has kicked her out of sister's public housing apartment but is getting her money, presumably SSI or SSDI, for allowing her to live there.  Disposition CSW called [place] to find out if pt has a legal guardian and, if necessary to make an APS report.  Pt's DSS Caseworker is Glenford Peers [phone]).  CSW called and left message for Ms. Anderson asking for a return call to clarify and verify pt's account.  Ms. Anderson returned my call and verified that DSS is the payee for pt and that pt has an ACT team ([facility]) who she will contact to pick patient up.  Areatha Keas. Judi Cong, MSW, [place] Disposition Clinical Social Work [phone] (cell) [phone] (office)

## 2018-01-30 NOTE — ED Notes (Signed)
Pt awake eating breakfast. Groggy from medication but alert and ox4.

## 2018-01-30 NOTE — Discharge Instructions (Signed)
You were evaluated in the emergency department for altered mental status.  He has been medically evaluated and it was felt that he could be discharged.  You should follow-up with your primary care doctor.  Please return if any worsening symptoms.
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
## 2018-01-30 NOTE — Progress Notes (Signed)
CSW spoke to Mathews Robinsons with [facility] who agreed to pick patient up at [facility].  Areatha Keas. Judi Cong, MSW, [place] Disposition Clinical Social Work [phone] (cell) [phone] (office)

## 2018-01-31 LAB — URINE CULTURE

## 2018-02-02 ENCOUNTER — Encounter: Payer: Self-pay | Admitting: Pediatric Intensive Care

## 2018-02-03 LAB — CULTURE, BLOOD (ROUTINE X 2)
CULTURE: NO GROWTH
SPECIAL REQUESTS: ADEQUATE

## 2018-02-26 NOTE — Congregational Nurse Program (Signed)
Congregational Nurse Program Note  Date of Encounter: 02/02/2018  Past Medical History: Past Medical History:  Diagnosis Date  . Bipolar disorder ([place])   . 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 Depression   . ETOH abuse   . Hx of radiation therapy 02/21/14-04/06/14   right buccal 66y/56fx  . Psychiatric disorder   . Shortness of breath    with exertion    Encounter Details: CNP Questionnaire - 02/02/18 1015      Questionnaire   Patient Status  Not Applicable    Race  Black or African American    Location Patient Hall  Medicare    Uninsured  Not Applicable    Food  Yes, have food insecurities    Housing/Utilities  No permanent housing    Transportation  No transportation needs    Interpersonal Safety  Yes, feel physically and emotionally safe where you currently live    Medication  No medication insecurities    Medical Provider  Yes    Referrals  Not Applicable    ED Visit Averted  Not Applicable    Life-Saving Intervention Made  Not Applicable     New client- she states she just wanted to come in to talk.

## 2018-03-06 ENCOUNTER — Encounter (HOSPITAL_COMMUNITY): Payer: Self-pay | Admitting: Emergency Medicine

## 2018-03-06 ENCOUNTER — Emergency Department (HOSPITAL_COMMUNITY)
Admission: EM | Admit: 2018-03-06 | Discharge: 2018-03-07 | Disposition: A | Discharge status: Home / Self Care | Payer: Medicare Other | Attending: Emergency Medicine | Admitting: Emergency Medicine

## 2018-03-06 ENCOUNTER — Other Ambulatory Visit: Payer: Self-pay

## 2018-03-06 DIAGNOSIS — Z79899 Other long term (current) drug therapy: Secondary | ICD-10-CM | POA: Insufficient documentation

## 2018-03-06 DIAGNOSIS — I10 Essential (primary) hypertension: Secondary | ICD-10-CM | POA: Insufficient documentation

## 2018-03-06 DIAGNOSIS — R1084 Generalized abdominal pain: Secondary | ICD-10-CM | POA: Diagnosis not present

## 2018-03-06 DIAGNOSIS — F17200 Nicotine dependence, unspecified, uncomplicated: Secondary | ICD-10-CM | POA: Diagnosis not present

## 2018-03-06 LAB — COMPREHENSIVE METABOLIC PANEL
ALK PHOS: 72 U/L (ref 38–126)
ALT: 14 U/L (ref 14–54)
ANION GAP: 8 (ref 5–15)
AST: 22 U/L (ref 15–41)
Albumin: 3.5 g/dL (ref 3.5–5.0)
BILIRUBIN TOTAL: 0.6 mg/dL (ref 0.3–1.2)
BUN: 16 mg/dL (ref 6–20)
CALCIUM: 9.3 mg/dL (ref 8.9–10.3)
CO2: 26 mmol/L (ref 22–32)
Chloride: 103 mmol/L (ref 101–111)
Creatinine, Ser: 0.78 mg/dL (ref 0.44–1.00)
Glucose, Bld: 93 mg/dL (ref 65–99)
POTASSIUM: 4.6 mmol/L (ref 3.5–5.1)
Sodium: 137 mmol/L (ref 135–145)
TOTAL PROTEIN: 7.3 g/dL (ref 6.5–8.1)

## 2018-03-06 LAB — URINALYSIS, ROUTINE W REFLEX MICROSCOPIC
Bilirubin Urine: NEGATIVE
Glucose, UA: NEGATIVE mg/dL
Hgb urine dipstick: NEGATIVE
KETONES UR: NEGATIVE mg/dL
LEUKOCYTES UA: NEGATIVE
NITRITE: NEGATIVE
PH: 6 (ref 5.0–8.0)
Protein, ur: NEGATIVE mg/dL
SPECIFIC GRAVITY, URINE: 1.019 (ref 1.005–1.030)

## 2018-03-06 LAB — CBC
HEMATOCRIT: 32.4 % — AB (ref 36.0–46.0)
HEMOGLOBIN: 10.9 g/dL — AB (ref 12.0–15.0)
MCH: 33.1 pg (ref 26.0–34.0)
MCHC: 33.6 g/dL (ref 30.0–36.0)
MCV: 98.5 fL (ref 78.0–100.0)
Platelets: 308 10*3/uL (ref 150–400)
RBC: 3.29 MIL/uL — ABNORMAL LOW (ref 3.87–5.11)
RDW: 15.6 % — ABNORMAL HIGH (ref 11.5–15.5)
WBC: 5 10*3/uL (ref 4.0–10.5)

## 2018-03-06 LAB — LIPASE, BLOOD: Lipase: 39 U/L (ref 11–51)

## 2018-03-06 MED ORDER — IBUPROFEN 400 MG PO TABS
400.0000 mg | ORAL_TABLET | Freq: Once | ORAL | Status: AC | PRN
  Administered 2018-03-06: 400 mg via ORAL
  Filled 2018-03-06: qty 1

## 2018-03-06 NOTE — ED Triage Notes (Signed)
Per [facility], pt endorses abd pain x 6 hours, no n/v/d and no change of diet or meds. VS 122/80, HR 88,  CBG 120, spo2 99% RA

## 2018-03-06 NOTE — ED Triage Notes (Signed)
Reports cramping to abdomen in all fields that started earlier today.  Denies any other symptoms.  Has not taken anything for pain.

## 2018-03-07 NOTE — Discharge Instructions (Addendum)
We recommend Tylenol for continued abdominal discomfort.  Follow-up with your primary care doctor for recheck of your symptoms as well as your blood pressure.  Continue your daily medications.  You may return for new or concerning symptoms.

## 2018-03-07 NOTE — ED Provider Notes (Signed)
[facility] EMERGENCY DEPARTMENT Provider Note   CSN: [id number] Arrival date & time: 03/06/18  1800     History   Chief Complaint Chief Complaint  Patient presents with  . Abdominal Pain    HPI Lorraine Tapia is a 67 y.o. female.  67 year old female with a history of psychiatric disorder, bipolar, alcohol abuse, mucosal cancer presents to the emergency department for abdominal pain.  She reports onset of sharp, abdominal cramping diffusely earlier today.  Symptoms have resolved since receiving Motrin in triage.  Patient denies taking any medications prior to arrival for symptoms.  No reported sick contacts.  She further denies fever, nausea, vomiting, dysuria, hematuria, diarrhea, bowel or bladder incontinence, extremity numbness or weakness.  No history of abdominal surgeries.  She is currently requesting something to eat and drink.  The history is provided by the patient. No language interpreter was used.  Abdominal Pain      Past Medical History:  Diagnosis Date  . Bipolar disorder ([place])   . 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 Depression   . ETOH abuse   . Hx of radiation therapy 02/21/14-04/06/14   right buccal 66y/11fx  . Psychiatric disorder   . Shortness of breath    with exertion    Patient Active Problem List   Diagnosis Date Noted  . Aggressive behavior   . Osteopenia determined by x-ray 05/15/2017  . Schizophrenia ([place]) 09/18/2016  . Unintentional weight loss 06/25/2016  . Edentulism 06/25/2016  . Seizures ([place])   . Vitamin D deficiency 03/27/2015  . RBC microcytosis 03/27/2015  . Heavy smoker 03/24/2015  . ETOH abuse 03/24/2015  . Bipolar disorder with psychotic features ([place]) 09/27/2014  . Other malignant neoplasm without specification of site 02/01/2014  . HTN (hypertension) 02/01/2014  . 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 Adenocarcinoma ([place]) 09/01/2013  . 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 MULTIPLE EXTRACTIONS WITH ALVEOLOPLASTY N/A 02/01/2014   Procedure: Extraction of tooth #'s 6,7,8,10,11,12,15,16,22,23,25,26,27 with alveoloplasty.;  Surgeon: Lenn Cal, DDS;  Location: [facility];  Service: Oral Surgery;  Laterality: N/A;  ANESTHESIA: GENERAL WITH NASAL TUBE  . Right buccal mucosa composite resection  10/19/2013   S/P right buccal mucosa composite resection with flap reconstruction and right modified neck dissection on 10/20/2039 by Dr. Fredricka Bonine at [facility]     OB History   None      Home Medications    Prior to Admission medications   Medication Sig Start Date End Date Taking? Authorizing Provider  haloperidol decanoate (HALDOL DECANOATE) 50 MG/ML injection Inject 200 mg into the muscle every 30 (thirty) days.   Yes [provider]  Calcium Citrate 200 MG TABS Take 2 tablets (400 mg total) by mouth daily. Patient not taking: Reported on 05/25/2017 05/15/17   Boykin Nearing, MD  folic acid (FOLVITE) 1 MG tablet Take 1 tablet (1 mg total) by mouth daily. Patient not taking: Reported on 09/17/2016 06/25/16   Boykin Nearing, MD  Multiple Vitamins-Minerals (MULTIVITAMIN) tablet Take 1 tablet by mouth daily. Patient not taking: Reported on 09/17/2016 06/25/16   Boykin Nearing, MD  thiamine (VITAMIN B-1) 100 MG tablet Take 1 tablet (100 mg total) by mouth daily. Patient not taking: Reported on 09/17/2016 06/25/16   Boykin Nearing, MD  Vitamin D, Ergocalciferol, (DRISDOL) 50000 units CAPS capsule Take 1 capsule (50,000 Units total) by mouth every 30 (  thirty) days. Patient not taking: Reported on 05/25/2017 05/15/17   Boykin Nearing, MD    Family History Family History  Problem Relation Age of Onset  . Cancer Mother   . Hypertension Mother   . Hypertension Father     Social History Social History   Tobacco Use  . Smoking status: Current Some Day Smoker  .  Smokeless tobacco: Never Used  Substance Use Topics  . Alcohol use: Yes    Alcohol/week: 1.2 oz    Types: 2 Cans of beer per week    Comment:  2 24  oz beer occasionally  . Drug use: No     Allergies   Other   Review of Systems Review of Systems  Gastrointestinal: Positive for abdominal pain.   Ten systems reviewed and are negative for acute change, except as noted in the HPI.    Physical Exam Updated Vital Signs BP (!) 156/70   Pulse 61   Temp 97.8 F (36.6 C)   Resp 16   Ht 5\' 11"  (1.803 m)   Wt 74.8 kg (165 lb)   SpO2 99%   BMI 23.01 kg/m   Physical Exam  Constitutional: She is oriented to person, place, and time. She appears well-developed and well-nourished. No distress.  Nontoxic and in NAD  HENT:  Head: Normocephalic and atraumatic.  Eyes: Conjunctivae and EOM are normal. No scleral icterus.  Neck: Normal range of motion.  Cardiovascular: Normal rate, regular rhythm and intact distal pulses.  DP pulse 2+ in BLE  Pulmonary/Chest: Effort normal. No stridor. No respiratory distress. She has no wheezes.  Lungs CTAB  Abdominal:  Soft, nontender, nondistended abdomen. Normoactive bowel sounds.  Musculoskeletal: Normal range of motion.  Neurological: She is alert and oriented to person, place, and time. She exhibits normal muscle tone. Coordination normal.  GCS 15. Moving all extremities spontaneously.  Skin: Skin is warm and dry. No rash noted. She is not diaphoretic. No erythema. No pallor.  Psychiatric: She has a normal mood and affect. Her behavior is normal.  Nursing note and vitals reviewed.    ED Treatments / Results  Labs (all labs ordered are listed, but only abnormal results are displayed) Labs Reviewed  CBC - Abnormal; Notable for the following components:      Result Value   RBC 3.29 (*)    Hemoglobin 10.9 (*)    HCT 32.4 (*)    RDW 15.6 (*)    All other components within normal limits  LIPASE, BLOOD  COMPREHENSIVE METABOLIC PANEL    URINALYSIS, ROUTINE W REFLEX MICROSCOPIC    EKG None  Radiology No results found.  Procedures Procedures (including critical care time)  Medications Ordered in ED Medications  ibuprofen (ADVIL,MOTRIN) tablet 400 mg (400 mg Oral Given 03/06/18 1913)    3:12 AM Repeat abdominal exam stable, nontender. Tolerated Coke without difficulty. No c/o worsening or recurrent abdominal pain.   Initial Impression / Assessment and Plan / ED Course  I have reviewed the triage vital signs and the nursing notes.  Pertinent labs & imaging results that were available during my care of the patient were reviewed by me and considered in my medical decision making (see chart for details).     67 year old female presents to the emergency department for complaints of abdominal pain.  Symptoms have improved following Motrin given in triage.  Patient afebrile today and without complaints of nausea, vomiting, diarrhea.  She denies urinary symptoms.  Laboratory workup performed in triage is consistent  with prior evaluations.  Patient noted to have a soft, nontender abdomen on initial and repeat exams.  She has been tolerating food and fluids in the emergency department without difficulty.  Low suspicion for emergent cause of symptoms today.  Plan for outpatient primary care follow-up.  The patient has also been advised to have her blood pressure rechecked by her primary doctor.  Return precautions discussed and provided. Patient discharged in stable condition with no unaddressed concerns.  Vitals:   03/07/18 0200 03/07/18 0215 03/07/18 0245 03/07/18 0300  BP: (!) 165/85 (!) 156/85 (!) 156/70 (!) 135/94  Pulse: 61 60 61   Resp:   16   Temp:      TempSrc:      SpO2: 100% 100% 99%   Weight:      Height:        Final Clinical Impressions(s) / ED Diagnoses   Final diagnoses:  Generalized abdominal pain  Hypertension not at goal    ED Discharge Orders    None       Antonietta Breach, PA-C 67/34/19  [id number]    Delora Fuel, MD 24/09/73 [phone]

## 2018-03-07 NOTE — ED Notes (Signed)
Pt given Coke for fluid challenge. Pt tolerating well. Will continue to monitor.

## 2018-03-07 NOTE — ED Notes (Signed)
Pt verbalizes understanding of d/c instructions. Pt ambulatory at d/c with all belongings.   

## 2018-05-28 DIAGNOSIS — F2081 Schizophreniform disorder: Secondary | ICD-10-CM | POA: Diagnosis not present

## 2018-10-22 DIAGNOSIS — H538 Other visual disturbances: Secondary | ICD-10-CM | POA: Diagnosis not present

## 2018-10-22 DIAGNOSIS — E559 Vitamin D deficiency, unspecified: Secondary | ICD-10-CM | POA: Diagnosis not present

## 2018-10-22 DIAGNOSIS — Z113 Encounter for screening for infections with a predominantly sexual mode of transmission: Secondary | ICD-10-CM | POA: Diagnosis not present

## 2018-10-22 DIAGNOSIS — Z5181 Encounter for therapeutic drug level monitoring: Secondary | ICD-10-CM | POA: Diagnosis not present

## 2018-10-22 DIAGNOSIS — Z131 Encounter for screening for diabetes mellitus: Secondary | ICD-10-CM | POA: Diagnosis not present

## 2018-10-22 DIAGNOSIS — I10 Essential (primary) hypertension: Secondary | ICD-10-CM | POA: Diagnosis not present

## 2018-10-22 DIAGNOSIS — Z720 Tobacco use: Secondary | ICD-10-CM | POA: Diagnosis not present

## 2018-10-22 DIAGNOSIS — Z01118 Encounter for examination of ears and hearing with other abnormal findings: Secondary | ICD-10-CM | POA: Diagnosis not present

## 2018-10-22 DIAGNOSIS — Z136 Encounter for screening for cardiovascular disorders: Secondary | ICD-10-CM | POA: Diagnosis not present

## 2018-12-25 DIAGNOSIS — I10 Essential (primary) hypertension: Secondary | ICD-10-CM | POA: Diagnosis not present

## 2019-01-19 DIAGNOSIS — Z23 Encounter for immunization: Secondary | ICD-10-CM | POA: Diagnosis not present

## 2019-01-19 DIAGNOSIS — Z720 Tobacco use: Secondary | ICD-10-CM | POA: Diagnosis not present

## 2019-01-19 DIAGNOSIS — I10 Essential (primary) hypertension: Secondary | ICD-10-CM | POA: Diagnosis not present

## 2019-01-19 DIAGNOSIS — E559 Vitamin D deficiency, unspecified: Secondary | ICD-10-CM | POA: Diagnosis not present

## 2019-01-20 ENCOUNTER — Other Ambulatory Visit: Payer: Self-pay | Admitting: Family Medicine

## 2019-01-20 ENCOUNTER — Other Ambulatory Visit: Payer: Self-pay | Admitting: Internal Medicine

## 2019-01-20 DIAGNOSIS — Z1231 Encounter for screening mammogram for malignant neoplasm of breast: Secondary | ICD-10-CM

## 2019-02-24 ENCOUNTER — Ambulatory Visit: Payer: Self-pay

## 2019-05-12 ENCOUNTER — Emergency Department (HOSPITAL_COMMUNITY)
Admission: EM | Admit: 2019-05-12 | Discharge: 2019-05-13 | Disposition: A | Discharge status: Other Acute Inpatient Hospital | Payer: Medicare Other | Attending: Emergency Medicine | Admitting: Emergency Medicine

## 2019-05-12 ENCOUNTER — Ambulatory Visit (HOSPITAL_COMMUNITY)
Admission: RE | Admit: 2019-05-12 | Discharge: 2019-05-12 | Disposition: A | Discharge status: Home / Self Care | Payer: Medicare Other | Source: Home / Self Care | Attending: Psychiatry | Admitting: Psychiatry

## 2019-05-12 ENCOUNTER — Emergency Department (HOSPITAL_COMMUNITY): Payer: Medicare Other

## 2019-05-12 DIAGNOSIS — F209 Schizophrenia, unspecified: Secondary | ICD-10-CM | POA: Insufficient documentation

## 2019-05-12 DIAGNOSIS — R45851 Suicidal ideations: Secondary | ICD-10-CM | POA: Insufficient documentation

## 2019-05-12 DIAGNOSIS — Z03818 Encounter for observation for suspected exposure to other biological agents ruled out: Secondary | ICD-10-CM | POA: Diagnosis not present

## 2019-05-12 DIAGNOSIS — S060X9A Concussion with loss of consciousness of unspecified duration, initial encounter: Secondary | ICD-10-CM | POA: Diagnosis not present

## 2019-05-12 DIAGNOSIS — R4182 Altered mental status, unspecified: Secondary | ICD-10-CM | POA: Diagnosis present

## 2019-05-12 DIAGNOSIS — F17200 Nicotine dependence, unspecified, uncomplicated: Secondary | ICD-10-CM | POA: Insufficient documentation

## 2019-05-12 LAB — COMPREHENSIVE METABOLIC PANEL
ALT: 14 U/L (ref 0–44)
AST: 25 U/L (ref 15–41)
Albumin: 4 g/dL (ref 3.5–5.0)
Alkaline Phosphatase: 50 U/L (ref 38–126)
Anion gap: 11 (ref 5–15)
BUN: 18 mg/dL (ref 8–23)
CO2: 26 mmol/L (ref 22–32)
Calcium: 10 mg/dL (ref 8.9–10.3)
Chloride: 105 mmol/L (ref 98–111)
Creatinine, Ser: 0.93 mg/dL (ref 0.44–1.00)
GFR calc Af Amer: >60 mL/min (ref >60)
GFR calc non Af Amer: >60 mL/min (ref >60)
Glucose, Bld: 88 mg/dL (ref 70–99)
Potassium: 3.6 mmol/L (ref 3.5–5.1)
Sodium: 142 mmol/L (ref 135–145)
Total Bilirubin: 1.3 mg/dL — ABNORMAL HIGH (ref 0.3–1.2)
Total Protein: 7.9 g/dL (ref 6.5–8.1)

## 2019-05-12 LAB — CBC WITH DIFFERENTIAL/PLATELET
Abs Immature Granulocytes: 0.01 10*3/uL (ref 0.00–0.07)
Basophils Absolute: 0 10*3/uL (ref 0.0–0.1)
Basophils Relative: 0 %
Eosinophils Absolute: 0 10*3/uL (ref 0.0–0.5)
Eosinophils Relative: 0 %
HCT: 36.2 % (ref 36.0–46.0)
Hemoglobin: 12.3 g/dL (ref 12.0–15.0)
Immature Granulocytes: 0 %
Lymphocytes Relative: 15 %
Lymphs Abs: 0.8 10*3/uL (ref 0.7–4.0)
MCH: 34.7 pg — ABNORMAL HIGH (ref 26.0–34.0)
MCHC: 34 g/dL (ref 30.0–36.0)
MCV: 102.3 fL — ABNORMAL HIGH (ref 80.0–100.0)
Monocytes Absolute: 0.4 10*3/uL (ref 0.1–1.0)
Monocytes Relative: 8 %
Neutro Abs: 4.1 10*3/uL (ref 1.7–7.7)
Neutrophils Relative %: 77 %
Platelets: 259 10*3/uL (ref 150–400)
RBC: 3.54 MIL/uL — ABNORMAL LOW (ref 3.87–5.11)
RDW: 12.5 % (ref 11.5–15.5)
WBC: 5.3 10*3/uL (ref 4.0–10.5)
nRBC: 0 % (ref 0.0–0.2)

## 2019-05-12 LAB — RAPID URINE DRUG SCREEN, HOSP PERFORMED
Amphetamines: NOT DETECTED
Barbiturates: NOT DETECTED
Benzodiazepines: NOT DETECTED
Cocaine: NOT DETECTED
Opiates: NOT DETECTED
Tetrahydrocannabinol: NOT DETECTED

## 2019-05-12 LAB — URINALYSIS, ROUTINE W REFLEX MICROSCOPIC
Bilirubin Urine: NEGATIVE
Glucose, UA: NEGATIVE mg/dL
Ketones, ur: NEGATIVE mg/dL
Leukocytes,Ua: NEGATIVE
Nitrite: NEGATIVE
Protein, ur: 100 mg/dL — AB
Specific Gravity, Urine: 1.011 (ref 1.005–1.030)
pH: 6 (ref 5.0–8.0)

## 2019-05-12 LAB — ETHANOL: Alcohol, Ethyl (B): <10 mg/dL (ref <10)

## 2019-05-12 LAB — SARS CORONAVIRUS 2 BY RT PCR (HOSPITAL ORDER, PERFORMED IN ~~LOC~~ HOSPITAL LAB): SARS Coronavirus 2: NEGATIVE

## 2019-05-12 MED ORDER — FOLIC ACID 1 MG PO TABS: 1.0000 mg | ORAL_TABLET | Freq: Every day | ORAL | Status: DC

## 2019-05-12 MED ORDER — ADULT MULTIVITAMIN W/MINERALS CH: 1.0000 | ORAL_TABLET | Freq: Every day | ORAL | Status: DC

## 2019-05-12 MED ORDER — CALCIUM CITRATE 950 (200 CA) MG PO TABS
1.0000 | ORAL_TABLET | Freq: Every day | ORAL | Status: DC
  Filled 2019-05-12: qty 1

## 2019-05-12 MED ORDER — ZIPRASIDONE MESYLATE 20 MG IM SOLR
10.0000 mg | Freq: Once | INTRAMUSCULAR | Status: AC
  Administered 2019-05-12: 10 mg via INTRAMUSCULAR
  Filled 2019-05-12: qty 20

## 2019-05-12 MED ORDER — QUETIAPINE FUMARATE 100 MG PO TABS
100.0000 mg | ORAL_TABLET | Freq: Every evening | ORAL | Status: DC | PRN
  Filled 2019-05-12: qty 1

## 2019-05-12 MED ORDER — VITAMIN B-1 100 MG PO TABS: 100.0000 mg | ORAL_TABLET | Freq: Every day | ORAL | Status: DC

## 2019-05-12 MED ORDER — STERILE WATER FOR INJECTION IJ SOLN
INTRAMUSCULAR | Status: AC
  Administered 2019-05-12: 0.6 mL
  Filled 2019-05-12: qty 10

## 2019-05-12 MED ORDER — LORAZEPAM 2 MG/ML IJ SOLN
2.0000 mg | Freq: Once | INTRAMUSCULAR | Status: AC
  Administered 2019-05-12: 2 mg via INTRAMUSCULAR
  Filled 2019-05-12: qty 1

## 2019-05-12 NOTE — ED Triage Notes (Signed)
Came in w/ [employer]; from home. Per report pt has hx of bipolar and schizophrenia. Along with current suicidal ideations and hallucination,

## 2019-05-12 NOTE — ED Notes (Signed)
Pt refused vitals and EKG 
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
## 2019-05-12 NOTE — BH Assessment (Signed)
[facility] Assessment Progress Note    Teleassessment machine to be set up.

## 2019-05-12 NOTE — ED Provider Notes (Addendum)
19:00: Assumed care of patient at change of shift from Lorraine Sink, PA-C pending labs and TTS consultation.  Please see prior provider note for full H&P. Briefly patient is a 68 year old female with a history of bipolar disorder and schizophrenia who was brought to the emergency department by [employer].  She is under IVC at this time.  Physical Exam  BP (!) 183/102 (BP Location: Right Arm)   Pulse 98   Temp 98.4 F (36.9 C) (Oral)   SpO2 95%   Physical Exam Vitals signs and nursing note reviewed.  Constitutional:      General: She is not in acute distress.    Appearance: She is well-developed.  HENT:     Head: Normocephalic and atraumatic.     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 No signs of infection.  Eyes:     General:        Right eye: No discharge.        Left eye: No discharge.     Extraocular Movements: Extraocular movements intact.     Conjunctiva/sclera: Conjunctivae normal.     Pupils: Pupils are equal, round, and reactive to light.  Neurological:     Mental Status: She is alert.     Comments: Somewhat of R sided facial droop noted, suspect this is secondary to chronic buccal mucosa changes. Otherwise no focal neuro deficits. 5/5 symmetric grip strength & strength with plantar/dorsiflexion bilaterally. Intact finger to nose. Negative pronator drift.   Psychiatric:        Behavior: Behavior normal.        Thought Content: Thought content normal.    ED Course/Procedures     Results for orders placed or performed during the hospital encounter of 05/12/19  SARS Coronavirus 2 (CEPHEID - Performed in [place] hospital lab), [facility] Order  Result Value Ref Range   SARS Coronavirus 2 NEGATIVE NEGATIVE  Comprehensive metabolic panel  Result Value Ref Range   Sodium 142 135 - 145 mmol/L   Potassium 3.6 3.5  - 5.1 mmol/L   Chloride 105 98 - 111 mmol/L   CO2 26 22 - 32 mmol/L   Glucose, Bld 88 70 - 99 mg/dL   BUN 18 8 - 23 mg/dL   Creatinine, Ser 0.93 0.44 - 1.00 mg/dL   Calcium 10.0 8.9 - 10.3 mg/dL   Total Protein 7.9 6.5 - 8.1 g/dL   Albumin 4.0 3.5 - 5.0 g/dL   AST 25 15 - 41 U/L   ALT 14 0 - 44 U/L   Alkaline Phosphatase 50 38 - 126 U/L   Total Bilirubin 1.3 (H) 0.3 - 1.2 mg/dL   GFR calc non Af Amer >60 >60 mL/min   GFR calc Af Amer >60 >60 mL/min   Anion gap 11 5 - 15  Ethanol  Result Value Ref Range   Alcohol, Ethyl (B) <10 <10 mg/dL  Urine rapid drug screen (hosp performed)  Result Value Ref Range   Opiates NONE DETECTED NONE DETECTED   Cocaine NONE DETECTED NONE DETECTED   Benzodiazepines NONE DETECTED NONE DETECTED   Amphetamines NONE DETECTED NONE DETECTED   Tetrahydrocannabinol NONE DETECTED NONE DETECTED   Barbiturates NONE DETECTED NONE DETECTED  CBC with Diff  Result Value Ref Range   WBC 5.3 4.0 - 10.5 K/uL   RBC 3.54 (L) 3.87 - 5.11  MIL/uL   Hemoglobin 12.3 12.0 - 15.0 g/dL   HCT 36.2 36.0 - 46.0 %   MCV 102.3 (H) 80.0 - 100.0 fL   MCH 34.7 (H) 26.0 - 34.0 pg   MCHC 34.0 30.0 - 36.0 g/dL   RDW 12.5 11.5 - 15.5 %   Platelets 259 150 - 400 K/uL   nRBC 0.0 0.0 - 0.2 %   Neutrophils Relative % 77 %   Neutro Abs 4.1 1.7 - 7.7 K/uL   Lymphocytes Relative 15 %   Lymphs Abs 0.8 0.7 - 4.0 K/uL   Monocytes Relative 8 %   Monocytes Absolute 0.4 0.1 - 1.0 K/uL   Eosinophils Relative 0 %   Eosinophils Absolute 0.0 0.0 - 0.5 K/uL   Basophils Relative 0 %   Basophils Absolute 0.0 0.0 - 0.1 K/uL   Immature Granulocytes 0 %   Abs Immature Granulocytes 0.01 0.00 - 0.07 K/uL  Urinalysis, Routine w reflex microscopic  Result Value Ref Range   Color, Urine YELLOW YELLOW   APPearance CLEAR CLEAR   Specific Gravity, Urine 1.011 1.005 - 1.030   pH 6.0 5.0 - 8.0   Glucose, UA NEGATIVE NEGATIVE mg/dL   Hgb urine dipstick SMALL (A) NEGATIVE   Bilirubin Urine NEGATIVE  NEGATIVE   Ketones, ur NEGATIVE NEGATIVE mg/dL   Protein, ur 100 (A) NEGATIVE mg/dL   Nitrite NEGATIVE NEGATIVE   Leukocytes,Ua NEGATIVE NEGATIVE   RBC / HPF 0-5 0 - 5 RBC/hpf   WBC, UA 0-5 0 - 5 WBC/hpf   Bacteria, UA RARE (A) NONE SEEN   Squamous Epithelial / LPF 0-5 0 - 5   Mucus PRESENT    Ct Head Wo Contrast  Result Date: 05/12/2019 CLINICAL DATA:  Altered LOC EXAM: CT HEAD WITHOUT CONTRAST TECHNIQUE: Contiguous axial images were obtained from the base of the skull through the vertex without intravenous contrast. COMPARISON:  01/29/2018, 02/04/2016 FINDINGS: Brain: No acute territorial infarction, hemorrhage, or intracranial mass. Mild atrophy and minimal small vessel ischemic changes of the white matter. Stable ventricle size Vascular: No hyperdense vessels.  No unexpected calcification Skull: No fracture. Sinuses/Orbits: Bony destructive changes involving the right maxilla and floor of right maxillary sinus. Mucosal opacification within the right maxillary sinus. Other: None IMPRESSION: 1. No CT evidence for acute intracranial abnormality. Mild atrophy and small vessel ischemic changes of the white matter. 2. Bony destructive changes involving the right maxilla and floor of the right maxillary sinus, either due to osteomyelitis or mass. Electronically Signed   By: Donavan Foil M.D.   On: 05/12/2019 23:36       Procedures  MDM   Labs ordered per prior provider CBC: no leukocytosis or significant anemia.  CMP: Fairly unremarkable. Renal function & LFTs WNL. No electrolyte derangement.  Ethanol/UDS: Negative.  UA: No UTI EKG: NSR, LVH, no STEMI.   Ultimately patient remains somewhat hypertensive which I suspect is secondary to agitation, but w/ R sided facial changes which are likely chronic w/ somewhat of a facial droop, CT head obtained- no bleed, no significant neuro deficits, do not suspect ischemic CVA. 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 Stark Jock- recommends patient have outpatient follow up with her oncologist for this, also in agreement that  not likely osteomyelitis & not likely ischemic CVA and that facial changes appear chronic.   Medically cleared. Disposition per [facility]. Home meds re-ordered.   Plan for geropsych per TTS. Pending placement.   Findings and plan of care discussed with supervising physician Dr. Stark Jock who has evaluated patient & is in agreement.         Lorraine Dyke, PA-C 05/13/19 0019    Lorraine Dyke, PA-C 05/13/19 0020    Veryl Speak, MD 05/13/19 [phone]
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
## 2019-05-13 ENCOUNTER — Emergency Department (HOSPITAL_COMMUNITY): Payer: Medicare Other

## 2019-05-13 DIAGNOSIS — F209 Schizophrenia, unspecified: Secondary | ICD-10-CM | POA: Diagnosis not present

## 2019-05-13 MED ORDER — LORAZEPAM 1 MG PO TABS: 2.0000 mg | ORAL_TABLET | Freq: Four times a day (QID) | ORAL | Status: DC | PRN

## 2019-05-13 MED ORDER — PREDNISONE 20 MG PO TABS
60.0000 mg | ORAL_TABLET | Freq: Once | ORAL | Status: DC
  Filled 2019-05-13: qty 3

## 2019-05-13 MED ORDER — HALOPERIDOL 5 MG PO TABS: 5.0000 mg | ORAL_TABLET | Freq: Four times a day (QID) | ORAL | Status: DC | PRN

## 2019-05-13 MED ORDER — DIPHENHYDRAMINE HCL 25 MG PO CAPS: 50.0000 mg | ORAL_CAPSULE | Freq: Four times a day (QID) | ORAL | Status: DC | PRN

## 2019-05-13 MED ORDER — DIPHENHYDRAMINE HCL 50 MG/ML IJ SOLN
25.0000 mg | Freq: Once | INTRAMUSCULAR | Status: DC
  Filled 2019-05-13: qty 1

## 2019-05-13 MED ORDER — DIPHENHYDRAMINE HCL 50 MG/ML IJ SOLN: 50.0000 mg | Freq: Four times a day (QID) | INTRAMUSCULAR | Status: DC | PRN

## 2019-05-13 MED ORDER — LORAZEPAM 2 MG/ML IJ SOLN
2.0000 mg | Freq: Four times a day (QID) | INTRAMUSCULAR | Status: DC | PRN
  Administered 2019-05-13: 2 mg via INTRAMUSCULAR
  Filled 2019-05-13: qty 1

## 2019-05-13 MED ORDER — FAMOTIDINE 20 MG PO TABS
40.0000 mg | ORAL_TABLET | Freq: Once | ORAL | Status: DC
  Filled 2019-05-13: qty 2

## 2019-05-13 MED ORDER — DIPHENHYDRAMINE HCL 50 MG/ML IJ SOLN
50.0000 mg | Freq: Once | INTRAMUSCULAR | Status: AC
  Administered 2019-05-13: 50 mg via INTRAMUSCULAR

## 2019-05-13 MED ORDER — HALOPERIDOL LACTATE 5 MG/ML IJ SOLN
5.0000 mg | Freq: Four times a day (QID) | INTRAMUSCULAR | Status: DC | PRN
  Administered 2019-05-13: 5 mg via INTRAMUSCULAR
  Filled 2019-05-13: qty 1
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
## 2019-05-13 NOTE — ED Notes (Signed)
Pt became agitated and threw mashed up banana at staff. [place] contacted for PRN meds, cooperative with IM injections

## 2019-05-13 NOTE — ED Notes (Signed)
Diet was ordered for Lunch. 

## 2019-05-13 NOTE — ED Notes (Signed)
Pt is calm and alert under blanket at this time

## 2019-05-13 NOTE — Progress Notes (Signed)
Pt. meets criteria for inpatient treatment per Lindon Romp, NP.  No appropriate beds available at [facility]. Referred out to the following hospitals: [facility]  [facility]       Disposition CSW will continue to follow for placement.  Areatha Keas. Judi Cong, MSW, [place] Disposition Clinical Social Work [phone] (cell) [phone] (office)

## 2019-05-13 NOTE — ED Notes (Signed)
Patient came out of room walking the unit and would not return to room; Sitter followed patient to other side of unit; Security and [employer] called to assist; pt back in room-Monique,RN

## 2019-05-13 NOTE — ED Provider Notes (Signed)
Patient awaiting geri-psych placement.  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  Unclear of chronicity.  She was evaluated by Dr. Zenia Resides this morning for this and was given Benadryl.  Otherwise stable.   Physical Exam  BP (!) 150/78 (BP Location: Right Arm)   Pulse 68   Temp 98.8 F (37.1 C) (Oral)   Resp 15   SpO2 100%   Physical Exam Vitals signs and nursing note reviewed.  Constitutional:      General: She is not in acute distress.    Appearance: She is well-developed.     Comments: Resting comfortably in bed.   HENT:     Head: Normocephalic and atraumatic.     Mouth/Throat:     Comments: Right sided facial swelling, tolerating secretions without difficulty. No upper airway stridor, normal phonation Eyes:     General:        Right eye: No discharge.        Left eye: No discharge.     Conjunctiva/sclera: Conjunctivae normal.  Neck:     Vascular: No JVD.     Trachea: No tracheal deviation.  Cardiovascular:     Rate and Rhythm: Normal rate.  Pulmonary:     Effort: Pulmonary effort is normal.     Breath sounds: Normal breath sounds. No wheezing.  Abdominal:     General: There is no distension.  Skin:    Findings: No erythema.  Neurological:     Mental Status: She is alert.  Psychiatric:        Behavior: Behavior normal.     ED Course/Procedures     Procedures  MDM  Patient resting comfortably on assessment.  Tolerating secretions without difficulty.  She has tolerated p.o. intake without difficulty.  No signs of anaphylaxis currently.  Stable for transfer.       Renita Papa, PA-C 05/13/19 1258    Lajean Saver, MD 05/15/19 1426

## 2019-05-13 NOTE — ED Notes (Signed)
Patient was given a snack a drink.

## 2019-05-13 NOTE — ED Provider Notes (Addendum)
ctsp due to right lower lip edema Pt on no new meds Suspect allergic rxn vs angiodema No airway compramise Will tx w/ steroid, pepcid, benadryl and recheck later  3:56 PM Patient rechecked prior to being discharged to facility and lip swelling is greatly improved.  She has no signs of airway compromise.  Patient has no clear cause of why she had this.  Stable for discharge   Lacretia Leigh, MD 05/13/19 4734    Lacretia Leigh, MD 05/13/19 1557

## 2019-05-13 NOTE — Progress Notes (Signed)
Pt accepted to [facility], Vira Blanco, MD, is the attending/accepting provider.  Call report to [phone]  [facility] ED notified.   Pt is IVC .  Pt may be transported by [facility] Pt scheduled  to arrive at [facility] as soon as transport can be arranged.  Areatha Keas. Judi Cong, MSW, [place] Disposition Clinical Social Work [phone] (cell) [phone] (office)  CSW to fax complete IVC to [facility].

## 2019-05-13 NOTE — Progress Notes (Signed)
[name] called and reported that the patient appears to only have Medicare B and [employer].  Without Medicare A, Patient would have to self-pay at their facility as they are not able to accept Medicaid patients..  CSW will ask [facility] ED CSW to follow up with admissions to verify that patient only has Medicare A. CSW will contact other hospitals that patient has been referred to to verify if they accept Medicaid as a primary.  Public Hospitals such as [facility] and [place] likely accept Medicaid. Private Hospitals such as [facility], [facility] typically are not able to take Medicaid as a primary insurance.  CSW will send patient out to other public hospitals as she does not strictly require gero-psych, although a gero-psych unit would likely be the most advantageous for the patient,  Lorraine Tapia. Judi Cong, MSW, [place] Disposition Clinical Social Work [phone] (cell) [phone] (office)

## 2019-05-13 NOTE — Progress Notes (Signed)
Pt appears to have Medicaid as her primary insurance for hospitalization.  Pt referred to the additional Public hospitals as a result:  [place] Details  [facility]  [facility]   [facility]  [place] [facility]  [facility]  [place] T. Judi Cong, MSW, [place] Disposition Clinical Social Work [phone] (cell) [phone] (office)

## 2019-05-13 NOTE — ED Notes (Signed)
Pt refused PO meds but was agreeable to IM medication. Discussed with Dr Zenia Resides benedryl changed to 50mg  IM

## 2019-05-13 NOTE — Progress Notes (Signed)
I was asked to review patients chart. Patient is an active patiient with [facility]. They faxed over patient's medication list and she is taking Seroquel 100 mg QHS and Haldol Decanoate 200 mg Q30 days. Per the paperwork, it is difficult to read, the patient's last injection was approximately 04-25-19. Patient was started on Haldol PO or Im PRN, Ativan PO or IM PRN, and benadryl PO or IM Prn to stabilize her.

## 2019-07-09 DIAGNOSIS — Z0001 Encounter for general adult medical examination with abnormal findings: Secondary | ICD-10-CM | POA: Diagnosis not present

## 2019-07-09 DIAGNOSIS — E559 Vitamin D deficiency, unspecified: Secondary | ICD-10-CM | POA: Diagnosis not present

## 2019-07-09 DIAGNOSIS — Z720 Tobacco use: Secondary | ICD-10-CM | POA: Diagnosis not present

## 2019-07-09 DIAGNOSIS — Z1389 Encounter for screening for other disorder: Secondary | ICD-10-CM | POA: Diagnosis not present

## 2019-07-09 DIAGNOSIS — I10 Essential (primary) hypertension: Secondary | ICD-10-CM | POA: Diagnosis not present

## 2019-08-06 DIAGNOSIS — E559 Vitamin D deficiency, unspecified: Secondary | ICD-10-CM | POA: Diagnosis not present

## 2019-08-06 DIAGNOSIS — N3946 Mixed incontinence: Secondary | ICD-10-CM | POA: Diagnosis not present

## 2019-08-06 DIAGNOSIS — Z720 Tobacco use: Secondary | ICD-10-CM | POA: Diagnosis not present

## 2019-08-06 DIAGNOSIS — I10 Essential (primary) hypertension: Secondary | ICD-10-CM | POA: Diagnosis not present

## 2019-08-18 DIAGNOSIS — Z23 Encounter for immunization: Secondary | ICD-10-CM | POA: Diagnosis not present

## 2019-09-28 DIAGNOSIS — H40023 Open angle with borderline findings, high risk, bilateral: Secondary | ICD-10-CM | POA: Diagnosis not present

## 2019-09-28 DIAGNOSIS — H40033 Anatomical narrow angle, bilateral: Secondary | ICD-10-CM | POA: Diagnosis not present

## 2019-09-28 DIAGNOSIS — H25043 Posterior subcapsular polar age-related cataract, bilateral: Secondary | ICD-10-CM | POA: Diagnosis not present

## 2019-09-28 DIAGNOSIS — H25013 Cortical age-related cataract, bilateral: Secondary | ICD-10-CM | POA: Diagnosis not present

## 2019-09-28 DIAGNOSIS — H0288A Meibomian gland dysfunction right eye, upper and lower eyelids: Secondary | ICD-10-CM | POA: Diagnosis not present

## 2019-09-28 DIAGNOSIS — H2513 Age-related nuclear cataract, bilateral: Secondary | ICD-10-CM | POA: Diagnosis not present

## 2019-09-28 DIAGNOSIS — H0288B Meibomian gland dysfunction left eye, upper and lower eyelids: Secondary | ICD-10-CM | POA: Diagnosis not present

## 2019-09-29 DIAGNOSIS — H40033 Anatomical narrow angle, bilateral: Secondary | ICD-10-CM | POA: Diagnosis not present

## 2019-09-29 DIAGNOSIS — H40023 Open angle with borderline findings, high risk, bilateral: Secondary | ICD-10-CM | POA: Diagnosis not present

## 2019-09-29 DIAGNOSIS — H0288B Meibomian gland dysfunction left eye, upper and lower eyelids: Secondary | ICD-10-CM | POA: Diagnosis not present

## 2019-09-29 DIAGNOSIS — H25813 Combined forms of age-related cataract, bilateral: Secondary | ICD-10-CM | POA: Diagnosis not present

## 2019-09-29 DIAGNOSIS — H0288A Meibomian gland dysfunction right eye, upper and lower eyelids: Secondary | ICD-10-CM | POA: Diagnosis not present

## 2020-09-12 ENCOUNTER — Emergency Department (HOSPITAL_COMMUNITY)
Admission: EM | Admit: 2020-09-12 | Discharge: 2020-09-13 | Disposition: A | Discharge status: Other Acute Inpatient Hospital | Payer: Medicare Other | Attending: Emergency Medicine | Admitting: Emergency Medicine

## 2020-09-12 DIAGNOSIS — F29 Unspecified psychosis not due to a substance or known physiological condition: Secondary | ICD-10-CM | POA: Insufficient documentation

## 2020-09-12 DIAGNOSIS — Z79899 Other long term (current) drug therapy: Secondary | ICD-10-CM | POA: Diagnosis not present

## 2020-09-12 DIAGNOSIS — R45850 Homicidal ideations: Secondary | ICD-10-CM | POA: Insufficient documentation

## 2020-09-12 DIAGNOSIS — Z046 Encounter for general psychiatric examination, requested by authority: Secondary | ICD-10-CM | POA: Diagnosis present

## 2020-09-12 DIAGNOSIS — I10 Essential (primary) hypertension: Secondary | ICD-10-CM | POA: Insufficient documentation

## 2020-09-12 DIAGNOSIS — F17200 Nicotine dependence, unspecified, uncomplicated: Secondary | ICD-10-CM | POA: Diagnosis not present

## 2020-09-12 DIAGNOSIS — Z20822 Contact with and (suspected) exposure to covid-19: Secondary | ICD-10-CM | POA: Insufficient documentation

## 2020-09-12 LAB — RAPID URINE DRUG SCREEN, HOSP PERFORMED
Amphetamines: NOT DETECTED
Barbiturates: NOT DETECTED
Benzodiazepines: NOT DETECTED
Cocaine: NOT DETECTED
Opiates: NOT DETECTED
Tetrahydrocannabinol: NOT DETECTED

## 2020-09-12 LAB — COMPREHENSIVE METABOLIC PANEL
ALT: 17 U/L (ref 0–44)
AST: 24 U/L (ref 15–41)
Albumin: 3.6 g/dL (ref 3.5–5.0)
Alkaline Phosphatase: 53 U/L (ref 38–126)
Anion gap: 10 (ref 5–15)
BUN: 18 mg/dL (ref 8–23)
CO2: 22 mmol/L (ref 22–32)
Calcium: 9.4 mg/dL (ref 8.9–10.3)
Chloride: 107 mmol/L (ref 98–111)
Creatinine, Ser: 0.95 mg/dL (ref 0.44–1.00)
GFR, Estimated: >60 mL/min (ref >60)
Glucose, Bld: 89 mg/dL (ref 70–99)
Potassium: 3.4 mmol/L — ABNORMAL LOW (ref 3.5–5.1)
Sodium: 139 mmol/L (ref 135–145)
Total Bilirubin: 0.8 mg/dL (ref 0.3–1.2)
Total Protein: 6.9 g/dL (ref 6.5–8.1)

## 2020-09-12 LAB — CBC
HCT: 29.9 % — ABNORMAL LOW (ref 36.0–46.0)
Hemoglobin: 9.9 g/dL — ABNORMAL LOW (ref 12.0–15.0)
MCH: 34.9 pg — ABNORMAL HIGH (ref 26.0–34.0)
MCHC: 33.1 g/dL (ref 30.0–36.0)
MCV: 105.3 fL — ABNORMAL HIGH (ref 80.0–100.0)
Platelets: 259 10*3/uL (ref 150–400)
RBC: 2.84 MIL/uL — ABNORMAL LOW (ref 3.87–5.11)
RDW: 14.6 % (ref 11.5–15.5)
WBC: 4.9 10*3/uL (ref 4.0–10.5)
nRBC: 0 % (ref 0.0–0.2)

## 2020-09-12 LAB — RESPIRATORY PANEL BY RT PCR (FLU A&B, COVID)
Influenza A by PCR: NEGATIVE
Influenza B by PCR: NEGATIVE
SARS Coronavirus 2 by RT PCR: NEGATIVE

## 2020-09-12 LAB — ETHANOL: Alcohol, Ethyl (B): 24 mg/dL — ABNORMAL HIGH (ref <10)

## 2020-09-12 LAB — ACETAMINOPHEN LEVEL: Acetaminophen (Tylenol), Serum: <10 ug/mL — ABNORMAL LOW (ref 10–30)

## 2020-09-12 LAB — SALICYLATE LEVEL: Salicylate Lvl: <7 mg/dL — ABNORMAL LOW (ref 7.0–30.0)

## 2020-09-12 MED ORDER — QUETIAPINE FUMARATE 100 MG PO TABS
100.0000 mg | ORAL_TABLET | Freq: Three times a day (TID) | ORAL | Status: DC
  Administered 2020-09-12 – 2020-09-13 (×2): 100 mg via ORAL
  Filled 2020-09-12 (×4): qty 1

## 2020-09-12 MED ORDER — THIAMINE HCL 100 MG/ML IJ SOLN: 100.0000 mg | Freq: Every day | INTRAMUSCULAR | Status: DC

## 2020-09-12 MED ORDER — VITAMIN B-1 100 MG PO TABS: 100.0000 mg | ORAL_TABLET | Freq: Every day | ORAL | Status: DC

## 2020-09-12 MED ORDER — THIAMINE HCL 100 MG PO TABS
100.0000 mg | ORAL_TABLET | Freq: Every day | ORAL | Status: DC
  Administered 2020-09-12 – 2020-09-13 (×2): 100 mg via ORAL
  Filled 2020-09-12 (×2): qty 1

## 2020-09-12 MED ORDER — ONDANSETRON HCL 4 MG PO TABS: 4.0000 mg | ORAL_TABLET | Freq: Three times a day (TID) | ORAL | Status: DC | PRN

## 2020-09-12 MED ORDER — LORAZEPAM 1 MG PO TABS: 0.0000 mg | ORAL_TABLET | Freq: Two times a day (BID) | ORAL | Status: DC

## 2020-09-12 MED ORDER — HYDROCHLOROTHIAZIDE 25 MG PO TABS
12.5000 mg | ORAL_TABLET | Freq: Every day | ORAL | Status: DC
  Administered 2020-09-12 – 2020-09-13 (×2): 12.5 mg via ORAL
  Filled 2020-09-12 (×2): qty 1

## 2020-09-12 MED ORDER — LORAZEPAM 1 MG PO TABS
0.0000 mg | ORAL_TABLET | Freq: Four times a day (QID) | ORAL | Status: DC
  Administered 2020-09-12: 1 mg via ORAL
  Filled 2020-09-12: qty 1

## 2020-09-12 MED ORDER — LORAZEPAM 2 MG/ML IJ SOLN: 0.0000 mg | Freq: Two times a day (BID) | INTRAMUSCULAR | Status: DC

## 2020-09-12 MED ORDER — ADULT MULTIVITAMIN W/MINERALS CH
1.0000 | ORAL_TABLET | Freq: Every day | ORAL | Status: DC
  Administered 2020-09-12 – 2020-09-13 (×2): 1 via ORAL
  Filled 2020-09-12: qty 1

## 2020-09-12 MED ORDER — LORAZEPAM 2 MG/ML IJ SOLN: 0.0000 mg | Freq: Four times a day (QID) | INTRAMUSCULAR | Status: DC
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
## 2020-09-12 NOTE — ED Provider Notes (Signed)
[facility] EMERGENCY DEPARTMENT Provider Note   CSN: [id number] Arrival date & time: 09/12/20  1553     History Chief Complaint  Patient presents with  . Psychiatric Evaluation    Lorraine Tapia is a 69 y.o. female.  Duplicate note.        Past Medical History:  Diagnosis Date  . Bipolar disorder ([place])   . Cancer ([place]) 10-19-2013   mucosa cancer  . Depression   . ETOH abuse   . Hx of radiation therapy 02/21/14-04/06/14   right buccal 66y/20fx  . Psychiatric disorder   . Shortness of breath    with exertion    Patient Active Problem List   Diagnosis Date Noted  . Aggressive behavior   . Osteopenia determined by x-ray 05/15/2017  . Schizophrenia ([place]) 09/18/2016  . Unintentional weight loss 06/25/2016  . Edentulism 06/25/2016  . Seizures ([place])   . Vitamin D deficiency 03/27/2015  . RBC microcytosis 03/27/2015  . Heavy smoker 03/24/2015  . ETOH abuse 03/24/2015  . Bipolar disorder with psychotic features ([place]) 09/27/2014  . Other malignant neoplasm without specification of site 02/01/2014  . HTN (hypertension) 02/01/2014  . 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 Adenocarcinoma ([place]) 09/01/2013  . 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 MULTIPLE EXTRACTIONS WITH ALVEOLOPLASTY N/A 02/01/2014   Procedure: Extraction of tooth #'s 6,7,8,10,11,12,15,16,22,23,25,26,27 with alveoloplasty.;  Surgeon: Lenn Cal, DDS;  Location: [facility];  Service: Oral Surgery;  Laterality: N/A;  ANESTHESIA: GENERAL WITH NASAL TUBE  . Right buccal mucosa composite resection  10/19/2013   S/P right buccal mucosa composite resection with flap reconstruction and right modified neck dissection on 10/20/2039 by Dr. Fredricka Bonine at [facility]     OB History   No obstetric history on file.     Family History  Problem Relation Age of Onset  . Cancer Mother   .  Hypertension Mother   . Hypertension Father     Social History   Tobacco Use  . Smoking status: Current Some Day Smoker  . Smokeless tobacco: Never Used  Substance Use Topics  . Alcohol use: Yes    Alcohol/week: 2.0 standard drinks    Types: 2 Cans of beer per week    Comment:  2 24  oz beer occasionally  . Drug use: No    Home Medications Prior to Admission medications   Medication Sig Start Date End Date Taking? Authorizing Provider  folic acid (FOLVITE) 1 MG tablet Take 1 tablet (1 mg total) by mouth daily. 06/25/16   Funches, Adriana Mccallum, MD  haloperidol decanoate (HALDOL DECANOATE) 100 MG/ML injection Inject 200 mg into the muscle every 28 (twenty-eight) days.  04/27/19   [provider]  hydrochlorothiazide (HYDRODIURIL) 12.5 MG tablet Take 12.5 mg by mouth daily. 04/28/19   [provider]  Multiple Vitamins-Minerals (MULTIVITAMIN) tablet Take 1 tablet by mouth daily. 06/25/16   Funches, Adriana Mccallum, MD  QUEtiapine (SEROQUEL) 100 MG tablet Take 100 mg by mouth 3 (three) times daily.  04/27/19   [provider]  thiamine (VITAMIN B-1) 100 MG tablet Take 1 tablet (100 mg total) by mouth daily. 06/25/16   Funches, Adriana Mccallum, MD  Vitamin D, Ergocalciferol, (DRISDOL) 50000 units CAPS capsule Take 1 capsule (50,000 Units total) by mouth every 30 (thirty) days. 05/15/17   Boykin Nearing, MD    Allergies    Other  Review of Systems  Review of Systems  Physical Exam Updated Vital Signs BP 101/89   Pulse 74   Temp 99.2 F (37.3 C) (Oral)   Resp 16   SpO2 97%   Physical Exam  ED Results / Procedures / Treatments   Labs (all labs ordered are listed, but only abnormal results are displayed) Labs Reviewed  COMPREHENSIVE METABOLIC PANEL - Abnormal; Notable for the following components:      Result Value   Potassium 3.4 (*)    All other components within normal limits  ETHANOL - Abnormal; Notable for the following components:   Alcohol, Ethyl (B) 24 (*)     All other components within normal limits  SALICYLATE LEVEL - Abnormal; Notable for the following components:   Salicylate Lvl <4.2 (*)    All other components within normal limits  ACETAMINOPHEN LEVEL - Abnormal; Notable for the following components:   Acetaminophen (Tylenol), Serum <10 (*)    All other components within normal limits  CBC - Abnormal; Notable for the following components:   RBC 2.84 (*)    Hemoglobin 9.9 (*)    HCT 29.9 (*)    MCV 105.3 (*)    MCH 34.9 (*)    All other components within normal limits  RAPID URINE DRUG SCREEN, HOSP PERFORMED    EKG None  Radiology No results found.  Procedures Procedures (including critical care time)  Medications Ordered in ED Medications - No data to display  ED Course  I have reviewed the triage vital signs and the nursing notes.  Pertinent labs & imaging results that were available during my care of the patient were reviewed by me and considered in my medical decision making (see chart for details).    MDM Rules/Calculators/A&P                           Final Clinical Impression(s) / ED Diagnoses Final diagnoses:  None    Rx / DC Orders ED Discharge Orders    None       Fredia Sorrow, MD 09/14/20 [phone]

## 2020-09-13 DIAGNOSIS — F29 Unspecified psychosis not due to a substance or known physiological condition: Secondary | ICD-10-CM | POA: Diagnosis not present

## 2020-09-13 NOTE — Progress Notes (Signed)
Pt meets psychiatric inpatient criteria per Lindon Romp, NP. Referral information has been sent to the following hospitals for review:  [facility]  [facility]  [facility]     Disposition will continue to assist with inpatient placement needs.    Audree Camel, MSW, LCSW, [place] Clinical Social Worker II Disposition CSW [phone]

## 2020-09-13 NOTE — ED Notes (Signed)
This RN inventoried this pts belongings. Belongings were placed in [address] [facility] [place]. No valuables nor medications were found in this pts possession. Pt refused to sign any documentation. RN will continue to monitor.
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
## 2020-09-13 NOTE — ED Notes (Signed)
Ordered breakfast--Lorraine Tapia 

## 2020-09-13 NOTE — Progress Notes (Signed)
Pt accepted to [facility], main campus    Dr. Jonelle Sports is the attending/accepting provider.   Call report to [phone]  [facility] ED notified.     Pt is involuntary and will be transported by law enforcement  Pt is scheduled to arrive as soon as transportation can be arranged.    Audree Camel, MSW, LCSW, [place] Clinical Social Worker II Disposition CSW [phone]

## 2020-09-13 NOTE — ED Provider Notes (Signed)
Patient accepted to [facility]. Has been resting comfortably today without any acute concerns.      Truddie Hidden, MD 09/13/20 1154

## 2020-09-13 NOTE — BH Assessment (Signed)
Lindon Romp, NP, patient meets inpatient criteria for geropsychiatry. Disposition SW will secure placement.

## 2020-12-23 ENCOUNTER — Emergency Department (HOSPITAL_COMMUNITY)
Admission: EM | Admit: 2020-12-23 | Discharge: 2021-01-02 | Disposition: E | Payer: Medicare Other | Attending: Emergency Medicine | Admitting: Emergency Medicine

## 2020-12-23 DIAGNOSIS — I469 Cardiac arrest, cause unspecified: Secondary | ICD-10-CM

## 2020-12-23 MED ORDER — EPINEPHRINE 1 MG/10ML IJ SOSY
PREFILLED_SYRINGE | INTRAMUSCULAR | Status: AC | PRN
  Administered 2020-12-23: 1 via INTRAVENOUS

## 2021-01-02 NOTE — ED Triage Notes (Signed)
Pt here via [facility] for cardiac arrest.  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  Known as a homeless person that frequented the store.  Found outside around 10, face down, as if she had fallen out of her wheelchair.  Fire arrived at [employer] [facility] and stated they had a weak pulse.  [facility] found pt to be in v-fib with no pulse.  Pt shocked x 3, 4 epi's, 450 of amiodarone, 300 ns given en-route.  King airway placed.  Cap decarsed from 37 - 48.  Last pulse check per [facility], pt was in PEA at rate of 29.

## 2021-01-02 NOTE — Code Documentation (Signed)
1 shock at 150 jules - no pulse

## 2021-01-02 NOTE — ED Provider Notes (Signed)
[facility] DEPARTMENT Provider Note   CSN: [id number] Arrival date & time: 01-17-2021  1045     History Chief Complaint  Patient presents with  . Cardiac Arrest    Lorraine Tapia is a 70 y.o. female.  Patient presents with EMS, unresponsive, CPR in progress. Per EMS report, pt was noted to go into a local [employer]/convenience store to get a beer around 830 this AM, and subsequently was noted to be sitting outside in wheelchair when she fell out of chair unresponsive. EMS was called - they found patient unresponsive, apneic, pulseless, pupils unresponsive. CPR was started and [facility] airway placed. EMS notes CBG 162. Patient was in vfib on monitor, was given amio 450 mg iv, epi iv, defib, with no return of pulses/rhythm. On arrival to ED, CPR for ~ 15-20 minutes. Pt unresponsive - level 5 caveat.   The history is provided by the patient and the EMS personnel. The history is limited by the condition of the patient.  Cardiac Arrest      No past medical history on file.  There are no problems to display for this patient.  Level 5 caveat - no medical history known, patient unresponsive.     OB History   No obstetric history on file.     No family history on file.     Home Medications Prior to Admission medications   Not on File    Allergies    Patient has no allergy information on record.  Review of Systems   Review of Systems  Unable to perform ROS: Patient unresponsive  level 5 caveat - pt unresponsive.     Physical Exam Updated Vital Signs There were no vitals taken for this visit.  Physical Exam Vitals and nursing note reviewed.  Constitutional:      Appearance: She is well-developed.     Comments: Unresponsive. CPR in progress.   HENT:     Head:     Comments: Contusion face/nose (pt noted by bystanders to fall from chair to ground).    Nose:     Comments: Contusion nose.     Mouth/Throat:     Comments: King  airway in place Eyes:     General: No scleral icterus.    Comments: Pupils 3-4 mm, unresponsive.   Neck:     Trachea: No tracheal deviation.     Comments: Trachea midline Cardiovascular:     Comments: No pulses. No heart sounds. Lucas device doing chest compressions.  Pulmonary:     Comments: Bag ventilating via King airway.  Abdominal:     General: There is no distension.     Palpations: Abdomen is soft.     Tenderness: There is no abdominal tenderness.  Genitourinary:    Comments: Normal external exam.  Musculoskeletal:        General: No swelling.     Cervical back: No muscular tenderness.  Skin:    General: Skin is dry.     Findings: No rash.  Neurological:     Comments: GCS 3. Pupils fixed, not responsive. No corneals or dolls eye.  Psychiatric:     Comments: Unresponsive.      ED Results / Procedures / Treatments   Labs (all labs ordered are listed, but only abnormal results are displayed) Labs Reviewed - No data to display  EKG None  Radiology No results found.  Procedures Procedures (including critical care time)  Medications Ordered in ED Medications  EPINEPHrine (ADRENALIN)  1 MG/10ML injection (1 Syringe Intravenous Given 01/08/21 1047)    ED Course  I have reviewed the triage vital signs and the nursing notes.  Pertinent labs & imaging results that were available during my care of the patient were reviewed by me and considered in my medical decision making (see chart for details).    MDM Rules/Calculators/A&P                         Continuous pulse ox and cardiac monitoring.   CPR initially continued. No pulses, no signs of life. Bag ventilate via Edison Pace airway.   Patient in v fib.  Defib x 1.  CPR. Patient in asystole and remains in asystole.  Patient pronounced dead at 1051.  Medical examiner notified.   [facility] indicates no name/identification of patient, and pt not known to them.   Medical examiner called back - discussed pt with ME Harris -  he indicates as no additional information known, will be ME case, and to send patient to morgue.      Final Clinical Impression(s) / ED Diagnoses Final diagnoses:  None    Rx / DC Orders ED Discharge Orders    None       Lajean Saver, MD 01/08/2021 1139

## 2021-01-02 NOTE — Code Documentation (Signed)
Pulse check. No pulse.

## 2021-01-02 NOTE — Code Documentation (Signed)
[facility] going through belongings and attempting to ID patient.

## 2021-01-02 DEATH — deceased

## 2021-02-11 IMAGING — CT CT HEAD WITHOUT CONTRAST
4 series · 16 of 47 positions shown, 18 images · non-contrast
Comparison: 01/29/2018, 02/04/2016

CLINICAL DATA: Altered LOC

EXAM:
CT HEAD WITHOUT CONTRAST
TECHNIQUE: Contiguous axial images were obtained from the base of the skull
through the vertex without intravenous contrast.

[Series 2: head wo · axial · 0.43mm/px · z∈[-80,+40]mm · 7 of 34 slices shown, 9 images]
[im 5/34  brain]
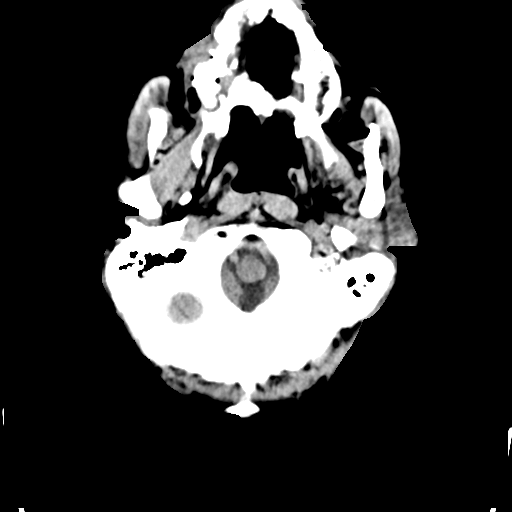
[im 5/34  bone]
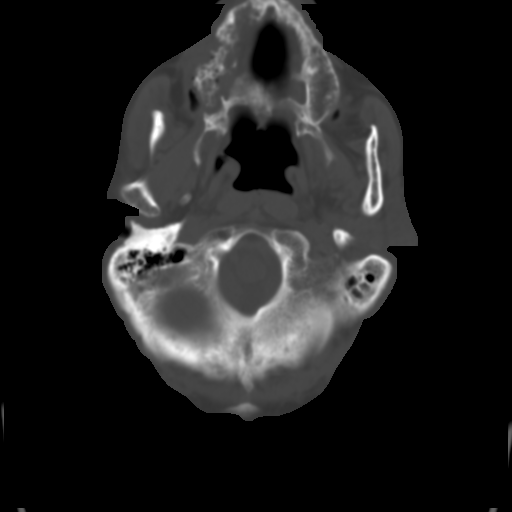
[im 9/34  brain]
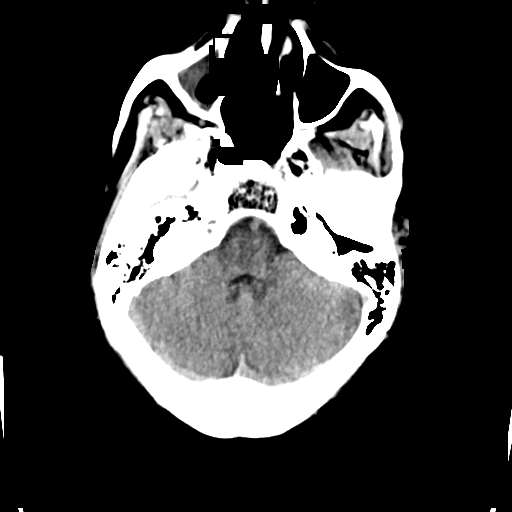
[im 13/34  brain]
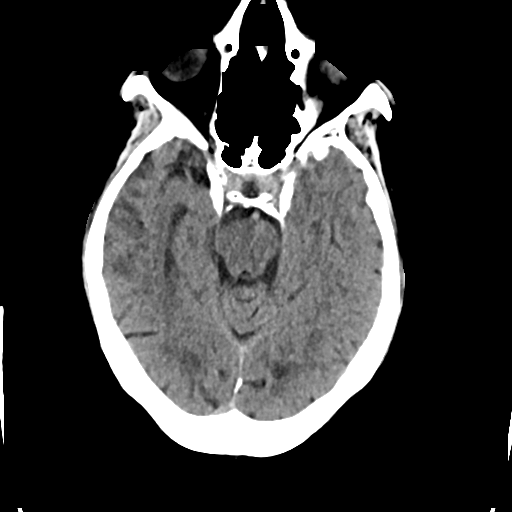
[im 17/34  brain]
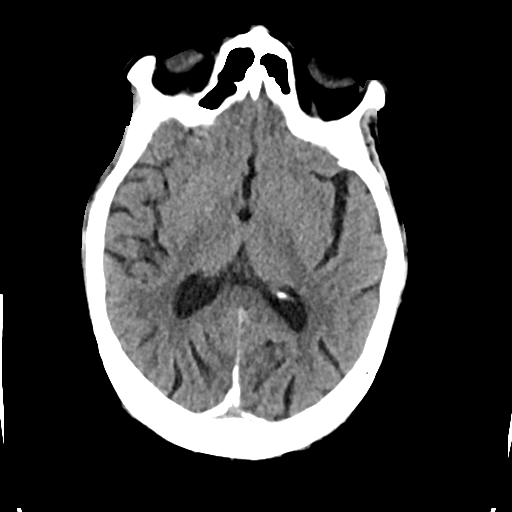
[im 21/34  brain]
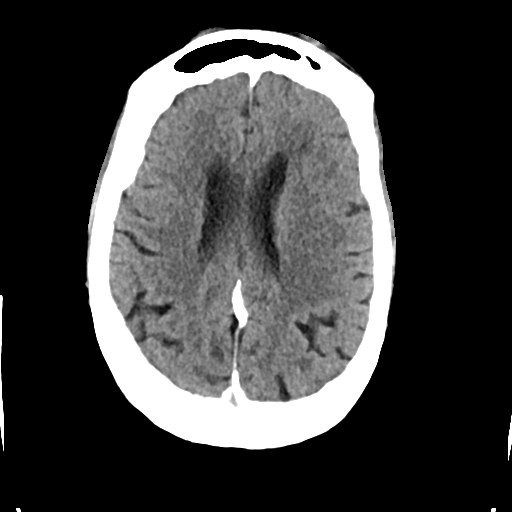
[im 21/34  bone]
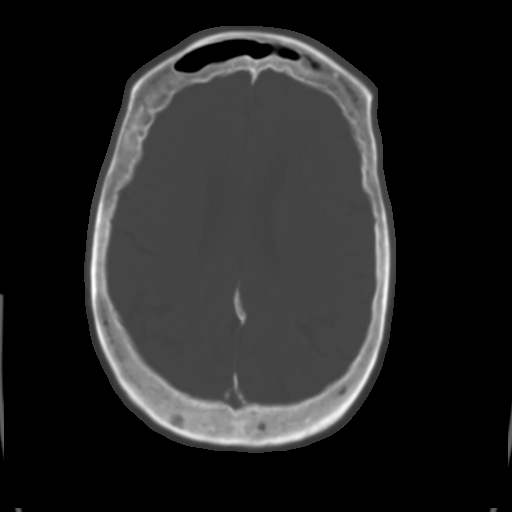
[im 25/34  brain]
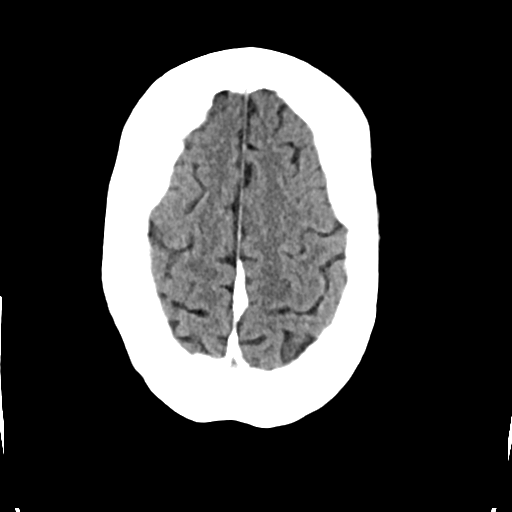
[im 29/34  brain]
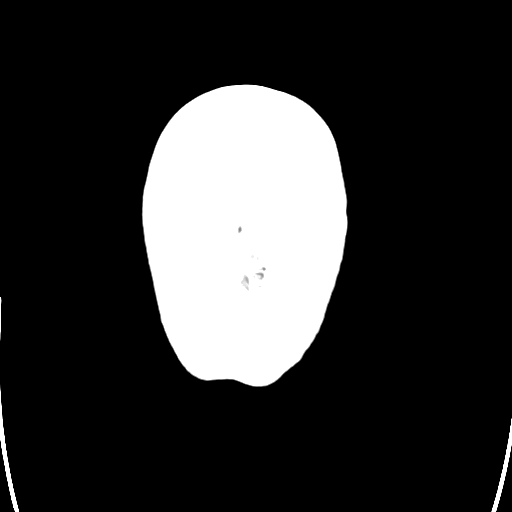

[Series 3: head bone · axial · 0.43mm/px · z∈[-84,-50]mm · 3 of 85 slices shown]
[im 9/85  bone]
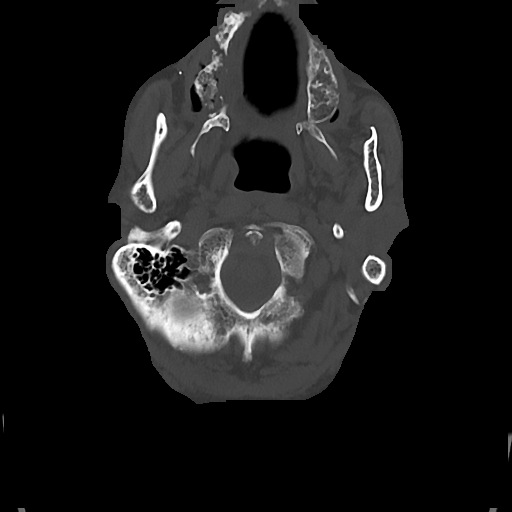
[im 17/85  bone]
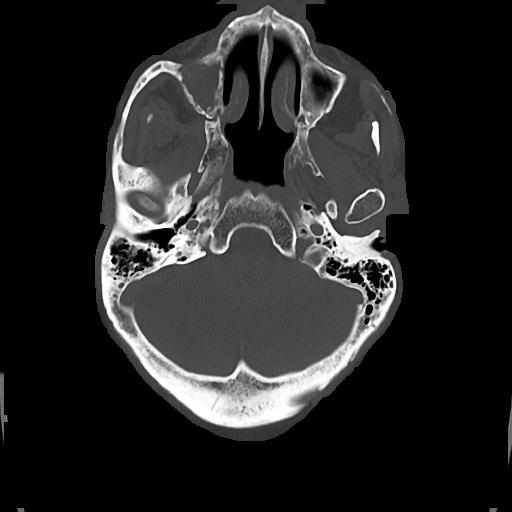
[im 26/85  bone]
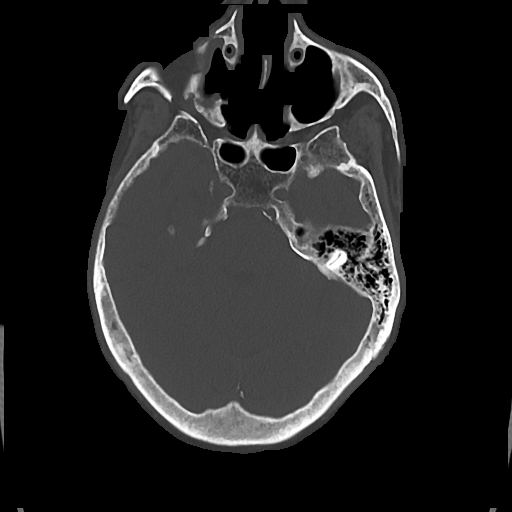

[Series 4: cor soft · coronal · 0.33mm/px · 3 of 67 slices shown]
[im 23/67  brain]
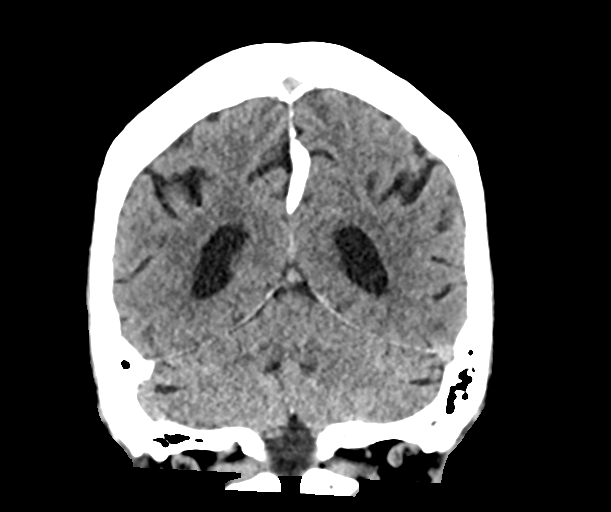
[im 30/67  brain]
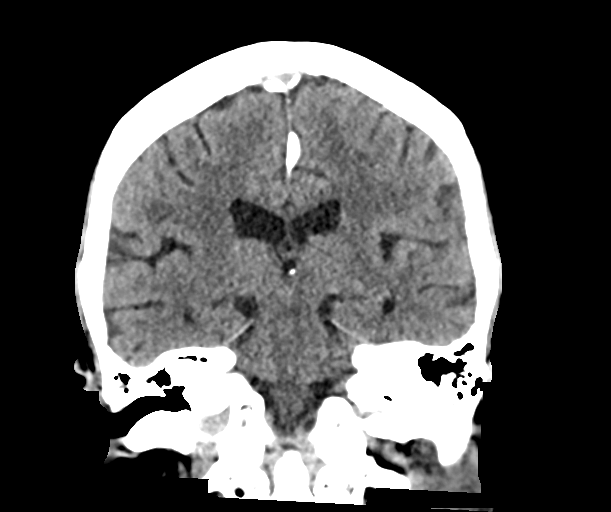
[im 37/67  brain]
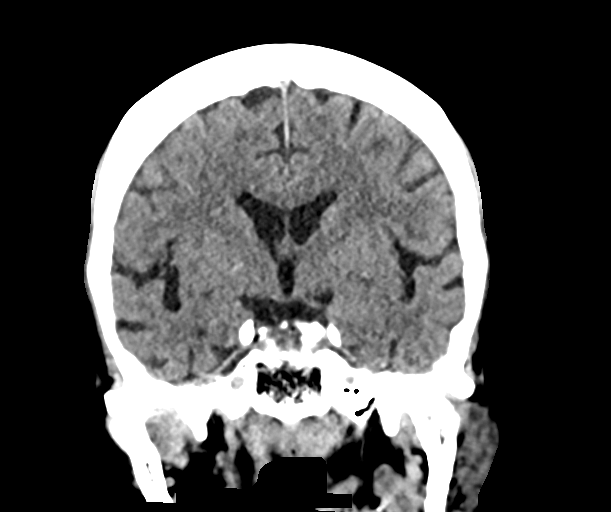

[Series 5: sag soft · sagittal · 0.33mm/px · 3 of 52 slices shown]
[im 18/52  brain]
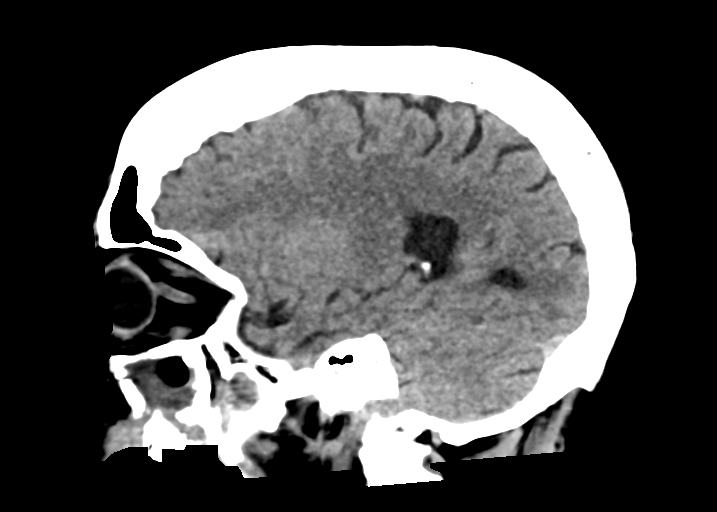
[im 26/52  brain]
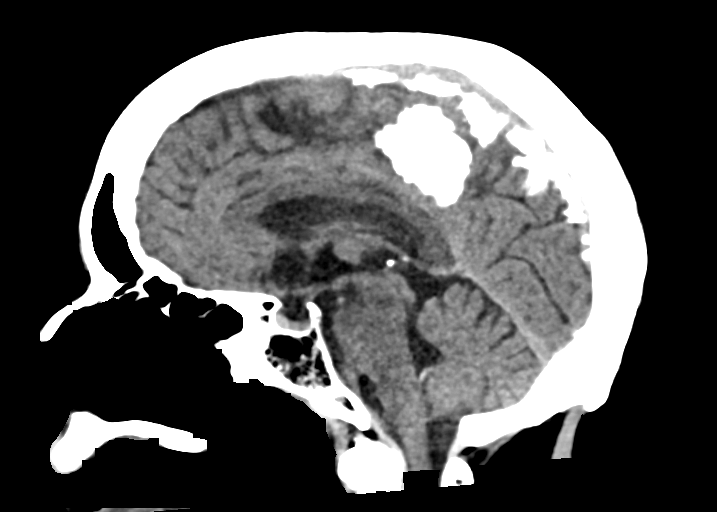
[im 35/52  brain]
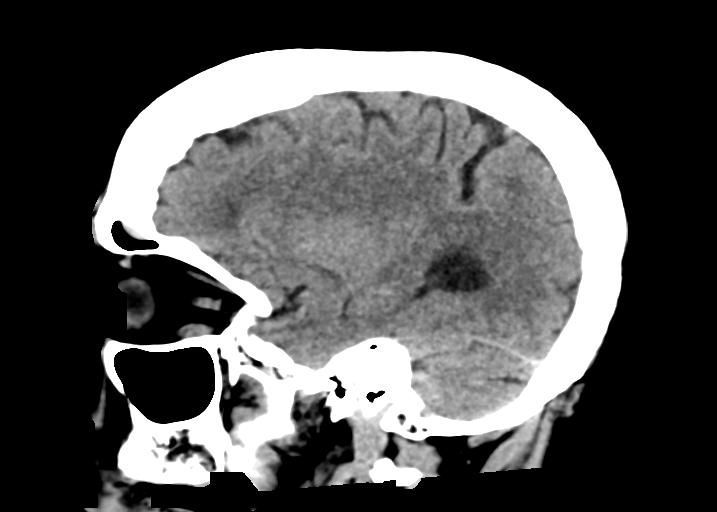

[16 of 47 positions shown; findings below may reference images not displayed]

FINDINGS: Brain: No acute territorial infarction, hemorrhage, or intracranial
mass. Mild atrophy and minimal small vessel ischemic changes of the
white matter. Stable ventricle size

Vascular: No hyperdense vessels.  No unexpected calcification

Skull: No fracture.

Sinuses/Orbits: Bony destructive changes involving the right maxilla
and floor of right maxillary sinus. Mucosal opacification within the
right maxillary sinus.

Other: None
IMPRESSION: 1. No CT evidence for acute intracranial abnormality. Mild atrophy
and small vessel ischemic changes of the white matter.
2. Bony destructive changes involving the right maxilla and floor of
the right maxillary sinus, either due to osteomyelitis or mass.
